# Patient Record
Sex: Female | Born: 1935 | Race: White | Hispanic: No | Smoking: Never smoker
Health system: Southern US, Community
[De-identification: ages and names within clinical notes are randomized; demographics above are authoritative.]

## PROBLEM LIST (undated history)

## (undated) DIAGNOSIS — I509 Heart failure, unspecified: Secondary | ICD-10-CM

## (undated) DIAGNOSIS — M1990 Unspecified osteoarthritis, unspecified site: Secondary | ICD-10-CM

## (undated) DIAGNOSIS — Z7722 Contact with and (suspected) exposure to environmental tobacco smoke (acute) (chronic): Secondary | ICD-10-CM

## (undated) DIAGNOSIS — J189 Pneumonia, unspecified organism: Secondary | ICD-10-CM

## (undated) DIAGNOSIS — E119 Type 2 diabetes mellitus without complications: Secondary | ICD-10-CM

## (undated) DIAGNOSIS — R296 Repeated falls: Secondary | ICD-10-CM

## (undated) DIAGNOSIS — K219 Gastro-esophageal reflux disease without esophagitis: Secondary | ICD-10-CM

## (undated) DIAGNOSIS — F329 Major depressive disorder, single episode, unspecified: Secondary | ICD-10-CM

## (undated) DIAGNOSIS — I951 Orthostatic hypotension: Secondary | ICD-10-CM

## (undated) DIAGNOSIS — I10 Essential (primary) hypertension: Secondary | ICD-10-CM

## (undated) DIAGNOSIS — A809 Acute poliomyelitis, unspecified: Secondary | ICD-10-CM

## (undated) DIAGNOSIS — R627 Adult failure to thrive: Secondary | ICD-10-CM

## (undated) DIAGNOSIS — E079 Disorder of thyroid, unspecified: Secondary | ICD-10-CM

## (undated) DIAGNOSIS — R0602 Shortness of breath: Secondary | ICD-10-CM

## (undated) DIAGNOSIS — Z950 Presence of cardiac pacemaker: Secondary | ICD-10-CM

## (undated) DIAGNOSIS — F32A Depression, unspecified: Secondary | ICD-10-CM

## (undated) DIAGNOSIS — J449 Chronic obstructive pulmonary disease, unspecified: Secondary | ICD-10-CM

## (undated) DIAGNOSIS — I480 Paroxysmal atrial fibrillation: Secondary | ICD-10-CM

## (undated) DIAGNOSIS — E871 Hypo-osmolality and hyponatremia: Secondary | ICD-10-CM

## (undated) HISTORY — PX: CHOLECYSTECTOMY: SHX55

## (undated) HISTORY — PX: EYE SURGERY: SHX253

## (undated) HISTORY — PX: ABDOMINAL HYSTERECTOMY: SHX81

---

## 2015-12-23 ENCOUNTER — Encounter (HOSPITAL_COMMUNITY): Payer: Self-pay | Admitting: *Deleted

## 2015-12-23 ENCOUNTER — Emergency Department (HOSPITAL_COMMUNITY): Payer: Medicare Other

## 2015-12-23 ENCOUNTER — Inpatient Hospital Stay (HOSPITAL_COMMUNITY)
Admission: EM | Admit: 2015-12-23 | Discharge: 2015-12-27 | DRG: 184 | Disposition: A | Discharge status: Skilled Nursing Facility | Payer: Medicare Other | Attending: Internal Medicine | Admitting: Internal Medicine

## 2015-12-23 DIAGNOSIS — E876 Hypokalemia: Secondary | ICD-10-CM | POA: Diagnosis present

## 2015-12-23 DIAGNOSIS — W1830XA Fall on same level, unspecified, initial encounter: Secondary | ICD-10-CM | POA: Diagnosis present

## 2015-12-23 DIAGNOSIS — I110 Hypertensive heart disease with heart failure: Secondary | ICD-10-CM | POA: Diagnosis present

## 2015-12-23 DIAGNOSIS — S2242XA Multiple fractures of ribs, left side, initial encounter for closed fracture: Principal | ICD-10-CM | POA: Diagnosis present

## 2015-12-23 DIAGNOSIS — R0781 Pleurodynia: Secondary | ICD-10-CM | POA: Diagnosis not present

## 2015-12-23 DIAGNOSIS — Z9049 Acquired absence of other specified parts of digestive tract: Secondary | ICD-10-CM

## 2015-12-23 DIAGNOSIS — S2239XA Fracture of one rib, unspecified side, initial encounter for closed fracture: Secondary | ICD-10-CM | POA: Diagnosis present

## 2015-12-23 DIAGNOSIS — E119 Type 2 diabetes mellitus without complications: Secondary | ICD-10-CM | POA: Diagnosis present

## 2015-12-23 DIAGNOSIS — R296 Repeated falls: Secondary | ICD-10-CM | POA: Diagnosis present

## 2015-12-23 DIAGNOSIS — Z79899 Other long term (current) drug therapy: Secondary | ICD-10-CM

## 2015-12-23 DIAGNOSIS — E860 Dehydration: Secondary | ICD-10-CM | POA: Diagnosis present

## 2015-12-23 DIAGNOSIS — S2232XA Fracture of one rib, left side, initial encounter for closed fracture: Secondary | ICD-10-CM

## 2015-12-23 DIAGNOSIS — J90 Pleural effusion, not elsewhere classified: Secondary | ICD-10-CM

## 2015-12-23 DIAGNOSIS — S2232XS Fracture of one rib, left side, sequela: Secondary | ICD-10-CM

## 2015-12-23 DIAGNOSIS — R55 Syncope and collapse: Secondary | ICD-10-CM | POA: Diagnosis present

## 2015-12-23 DIAGNOSIS — I509 Heart failure, unspecified: Secondary | ICD-10-CM

## 2015-12-23 DIAGNOSIS — Z952 Presence of prosthetic heart valve: Secondary | ICD-10-CM

## 2015-12-23 DIAGNOSIS — F339 Major depressive disorder, recurrent, unspecified: Secondary | ICD-10-CM | POA: Diagnosis present

## 2015-12-23 DIAGNOSIS — I4891 Unspecified atrial fibrillation: Secondary | ICD-10-CM | POA: Diagnosis not present

## 2015-12-23 DIAGNOSIS — J449 Chronic obstructive pulmonary disease, unspecified: Secondary | ICD-10-CM | POA: Diagnosis not present

## 2015-12-23 DIAGNOSIS — S2249XA Multiple fractures of ribs, unspecified side, initial encounter for closed fracture: Secondary | ICD-10-CM | POA: Diagnosis present

## 2015-12-23 DIAGNOSIS — E871 Hypo-osmolality and hyponatremia: Secondary | ICD-10-CM | POA: Diagnosis present

## 2015-12-23 DIAGNOSIS — R627 Adult failure to thrive: Secondary | ICD-10-CM | POA: Diagnosis present

## 2015-12-23 DIAGNOSIS — W19XXXA Unspecified fall, initial encounter: Secondary | ICD-10-CM

## 2015-12-23 DIAGNOSIS — Y92091 Bathroom in other non-institutional residence as the place of occurrence of the external cause: Secondary | ICD-10-CM

## 2015-12-23 DIAGNOSIS — E039 Hypothyroidism, unspecified: Secondary | ICD-10-CM | POA: Diagnosis present

## 2015-12-23 DIAGNOSIS — I10 Essential (primary) hypertension: Secondary | ICD-10-CM | POA: Diagnosis present

## 2015-12-23 DIAGNOSIS — R7303 Prediabetes: Secondary | ICD-10-CM

## 2015-12-23 HISTORY — DX: Presence of cardiac pacemaker: Z95.0

## 2015-12-23 HISTORY — DX: Disorder of thyroid, unspecified: E07.9

## 2015-12-23 HISTORY — DX: Unspecified osteoarthritis, unspecified site: M19.90

## 2015-12-23 HISTORY — DX: Pneumonia, unspecified organism: J18.9

## 2015-12-23 HISTORY — DX: Essential (primary) hypertension: I10

## 2015-12-23 HISTORY — DX: Chronic obstructive pulmonary disease, unspecified: J44.9

## 2015-12-23 HISTORY — DX: Shortness of breath: R06.02

## 2015-12-23 HISTORY — DX: Type 2 diabetes mellitus without complications: E11.9

## 2015-12-23 HISTORY — DX: Heart failure, unspecified: I50.9

## 2015-12-23 HISTORY — DX: Depression, unspecified: F32.A

## 2015-12-23 HISTORY — DX: Gastro-esophageal reflux disease without esophagitis: K21.9

## 2015-12-23 HISTORY — DX: Acute poliomyelitis, unspecified: A80.9

## 2015-12-23 HISTORY — DX: Major depressive disorder, single episode, unspecified: F32.9

## 2015-12-23 LAB — COMPREHENSIVE METABOLIC PANEL
ALT: 13 U/L — ABNORMAL LOW (ref 14–54)
AST: 17 U/L (ref 15–41)
Albumin: 4.1 g/dL (ref 3.5–5.0)
Alkaline Phosphatase: 79 U/L (ref 38–126)
Anion gap: 10 (ref 5–15)
BUN: 14 mg/dL (ref 6–20)
CHLORIDE: 96 mmol/L — AB (ref 101–111)
CO2: 26 mmol/L (ref 22–32)
Calcium: 9.3 mg/dL (ref 8.9–10.3)
Creatinine, Ser: 0.94 mg/dL (ref 0.44–1.00)
GFR, EST NON AFRICAN AMERICAN: 53 mL/min — AB (ref >60)
Glucose, Bld: 126 mg/dL — ABNORMAL HIGH (ref 65–99)
POTASSIUM: 3.7 mmol/L (ref 3.5–5.1)
SODIUM: 132 mmol/L — AB (ref 135–145)
Total Bilirubin: 0.7 mg/dL (ref 0.3–1.2)
Total Protein: 6.7 g/dL (ref 6.5–8.1)

## 2015-12-23 LAB — CBC WITH DIFFERENTIAL/PLATELET
BASOS ABS: 0 10*3/uL (ref 0.0–0.1)
Basophils Relative: 0 %
EOS ABS: 0.1 10*3/uL (ref 0.0–0.7)
EOS PCT: 1 %
HCT: 39.8 % (ref 36.0–46.0)
Hemoglobin: 13.5 g/dL (ref 12.0–15.0)
LYMPHS PCT: 12 %
Lymphs Abs: 0.9 10*3/uL (ref 0.7–4.0)
MCH: 27.1 pg (ref 26.0–34.0)
MCHC: 33.9 g/dL (ref 30.0–36.0)
MCV: 79.9 fL (ref 78.0–100.0)
MONO ABS: 0.9 10*3/uL (ref 0.1–1.0)
Monocytes Relative: 12 %
Neutro Abs: 5.6 10*3/uL (ref 1.7–7.7)
Neutrophils Relative %: 75 %
PLATELETS: 245 10*3/uL (ref 150–400)
RBC: 4.98 MIL/uL (ref 3.87–5.11)
RDW: 13.6 % (ref 11.5–15.5)
WBC: 7.5 10*3/uL (ref 4.0–10.5)

## 2015-12-23 LAB — CBG MONITORING, ED: GLUCOSE-CAPILLARY: 137 mg/dL — AB (ref 65–99)

## 2015-12-23 LAB — PROTIME-INR
INR: 1.04
PROTHROMBIN TIME: 13.6 s (ref 11.4–15.2)

## 2015-12-23 LAB — BRAIN NATRIURETIC PEPTIDE: B Natriuretic Peptide: 855.7 pg/mL — ABNORMAL HIGH (ref 0.0–100.0)

## 2015-12-23 LAB — I-STAT TROPONIN, ED: TROPONIN I, POC: 0.02 ng/mL (ref 0.00–0.08)

## 2015-12-23 LAB — GLUCOSE, CAPILLARY: Glucose-Capillary: 116 mg/dL — ABNORMAL HIGH (ref 65–99)

## 2015-12-23 MED ORDER — ENOXAPARIN SODIUM 40 MG/0.4ML ~~LOC~~ SOLN
40.0000 mg | Freq: Every day | SUBCUTANEOUS | Status: DC
  Administered 2015-12-23 – 2015-12-26 (×4): 40 mg via SUBCUTANEOUS
  Filled 2015-12-23 (×4): qty 0.4

## 2015-12-23 MED ORDER — ALBUTEROL SULFATE (2.5 MG/3ML) 0.083% IN NEBU
3.0000 mL | INHALATION_SOLUTION | Freq: Four times a day (QID) | RESPIRATORY_TRACT | Status: DC | PRN
  Administered 2015-12-24 – 2015-12-27 (×2): 3 mL via RESPIRATORY_TRACT
  Filled 2015-12-23 (×2): qty 3

## 2015-12-23 MED ORDER — DOCUSATE SODIUM 100 MG PO CAPS
100.0000 mg | ORAL_CAPSULE | Freq: Two times a day (BID) | ORAL | Status: DC
  Administered 2015-12-23 – 2015-12-27 (×8): 100 mg via ORAL
  Filled 2015-12-23 (×8): qty 1

## 2015-12-23 MED ORDER — MOMETASONE FURO-FORMOTEROL FUM 200-5 MCG/ACT IN AERO
2.0000 | INHALATION_SPRAY | Freq: Two times a day (BID) | RESPIRATORY_TRACT | Status: DC
Start: 2015-12-23 — End: 2015-12-27
  Administered 2015-12-23 – 2015-12-27 (×8): 2 via RESPIRATORY_TRACT
  Filled 2015-12-23: qty 8.8

## 2015-12-23 MED ORDER — CICLOPIROX OLAMINE 0.77 % EX CREA: 1.0000 "application " | TOPICAL_CREAM | Freq: Two times a day (BID) | CUTANEOUS | Status: DC | PRN

## 2015-12-23 MED ORDER — KETOROLAC TROMETHAMINE 15 MG/ML IJ SOLN
15.0000 mg | Freq: Four times a day (QID) | INTRAMUSCULAR | Status: DC | PRN
  Administered 2015-12-23 – 2015-12-24 (×2): 15 mg via INTRAVENOUS
  Filled 2015-12-23 (×2): qty 1

## 2015-12-23 MED ORDER — INSULIN ASPART 100 UNIT/ML ~~LOC~~ SOLN
0.0000 [IU] | Freq: Three times a day (TID) | SUBCUTANEOUS | Status: DC
  Administered 2015-12-25 (×2): 2 [IU] via SUBCUTANEOUS
  Administered 2015-12-26: 3 [IU] via SUBCUTANEOUS
  Administered 2015-12-26: 1 [IU] via SUBCUTANEOUS
  Administered 2015-12-27: 2 [IU] via SUBCUTANEOUS

## 2015-12-23 MED ORDER — TRAMADOL HCL 50 MG PO TABS: 50.0000 mg | ORAL_TABLET | Freq: Four times a day (QID) | ORAL | Status: DC | PRN

## 2015-12-23 MED ORDER — MUSCLE RUB 10-15 % EX CREA
1.0000 "application " | TOPICAL_CREAM | Freq: Three times a day (TID) | CUTANEOUS | Status: DC
  Administered 2015-12-23 – 2015-12-27 (×8): 1 via TOPICAL
  Filled 2015-12-23: qty 85

## 2015-12-23 MED ORDER — MELATONIN 3 MG PO TABS: 3.0000 mg | ORAL_TABLET | Freq: Every day | ORAL | Status: DC

## 2015-12-23 MED ORDER — LEVOTHYROXINE SODIUM 100 MCG PO TABS
100.0000 ug | ORAL_TABLET | Freq: Every day | ORAL | Status: DC
  Administered 2015-12-24 – 2015-12-27 (×4): 100 ug via ORAL
  Filled 2015-12-23 (×4): qty 1

## 2015-12-23 MED ORDER — FENTANYL CITRATE (PF) 100 MCG/2ML IJ SOLN
25.0000 ug | Freq: Once | INTRAMUSCULAR | Status: AC
  Administered 2015-12-23: 25 ug via INTRAVENOUS
  Filled 2015-12-23: qty 2

## 2015-12-23 MED ORDER — ONDANSETRON HCL 4 MG/2ML IJ SOLN: 4.0000 mg | Freq: Four times a day (QID) | INTRAMUSCULAR | Status: DC | PRN

## 2015-12-23 MED ORDER — DILTIAZEM HCL ER COATED BEADS 120 MG PO CP24
120.0000 mg | ORAL_CAPSULE | Freq: Every day | ORAL | Status: DC
  Administered 2015-12-23 – 2015-12-26 (×4): 120 mg via ORAL
  Filled 2015-12-23 (×4): qty 1

## 2015-12-23 MED ORDER — SODIUM CHLORIDE 0.9% FLUSH
3.0000 mL | Freq: Two times a day (BID) | INTRAVENOUS | Status: DC
  Administered 2015-12-23 – 2015-12-27 (×6): 3 mL via INTRAVENOUS

## 2015-12-23 MED ORDER — FUROSEMIDE 10 MG/ML IJ SOLN
20.0000 mg | Freq: Once | INTRAMUSCULAR | Status: AC
  Administered 2015-12-23: 20 mg via INTRAVENOUS
  Filled 2015-12-23: qty 4

## 2015-12-23 MED ORDER — POLYVINYL ALCOHOL 1.4 % OP SOLN
1.0000 [drp] | Freq: Two times a day (BID) | OPHTHALMIC | Status: DC
  Administered 2015-12-23 – 2015-12-27 (×7): 1 [drp] via OPHTHALMIC
  Filled 2015-12-23: qty 15

## 2015-12-23 MED ORDER — IPRATROPIUM BROMIDE 0.02 % IN SOLN
0.5000 mg | Freq: Four times a day (QID) | RESPIRATORY_TRACT | Status: DC | PRN
Start: 2015-12-23 — End: 2015-12-27

## 2015-12-23 MED ORDER — ACETAMINOPHEN 650 MG RE SUPP: 650.0000 mg | Freq: Four times a day (QID) | RECTAL | Status: DC | PRN

## 2015-12-23 MED ORDER — LORATADINE 10 MG PO TABS
10.0000 mg | ORAL_TABLET | Freq: Every day | ORAL | Status: DC
  Administered 2015-12-23 – 2015-12-27 (×5): 10 mg via ORAL
  Filled 2015-12-23 (×5): qty 1

## 2015-12-23 MED ORDER — PANTOPRAZOLE SODIUM 40 MG PO TBEC
40.0000 mg | DELAYED_RELEASE_TABLET | Freq: Every day | ORAL | Status: DC
  Administered 2015-12-23 – 2015-12-27 (×5): 40 mg via ORAL
  Filled 2015-12-23 (×5): qty 1

## 2015-12-23 MED ORDER — IOPAMIDOL (ISOVUE-370) INJECTION 76%
100.0000 mL | Freq: Once | INTRAVENOUS | Status: AC | PRN
  Administered 2015-12-23: 80 mL via INTRAVENOUS

## 2015-12-23 MED ORDER — FUROSEMIDE 20 MG PO TABS
10.0000 mg | ORAL_TABLET | Freq: Every day | ORAL | Status: DC
  Administered 2015-12-23 – 2015-12-27 (×5): 10 mg via ORAL
  Filled 2015-12-23 (×5): qty 1

## 2015-12-23 MED ORDER — PSYLLIUM 95 % PO PACK
1.0000 | PACK | Freq: Every day | ORAL | Status: DC
  Administered 2015-12-23 – 2015-12-26 (×4): 1 via ORAL
  Filled 2015-12-23 (×4): qty 1

## 2015-12-23 MED ORDER — LOSARTAN POTASSIUM 50 MG PO TABS
25.0000 mg | ORAL_TABLET | Freq: Every day | ORAL | Status: DC
  Administered 2015-12-23 – 2015-12-27 (×5): 25 mg via ORAL
  Filled 2015-12-23 (×5): qty 1

## 2015-12-23 MED ORDER — ACETAMINOPHEN 325 MG PO TABS: 650.0000 mg | ORAL_TABLET | Freq: Four times a day (QID) | ORAL | Status: DC | PRN

## 2015-12-23 MED ORDER — SERTRALINE HCL 100 MG PO TABS
100.0000 mg | ORAL_TABLET | Freq: Every day | ORAL | Status: DC
  Administered 2015-12-24: 100 mg via ORAL
  Filled 2015-12-23: qty 1

## 2015-12-23 MED ORDER — ONDANSETRON HCL 4 MG PO TABS: 4.0000 mg | ORAL_TABLET | Freq: Four times a day (QID) | ORAL | Status: DC | PRN

## 2015-12-23 NOTE — ED Triage Notes (Signed)
Patient brought in via EMS from [facility]. Patient had an unwitnessed fall without head injury. Patient states she became dizzy and tripped on walker. Patient admits to dizziness x 5 days. Patient c/o rt rib pain 10/10

## 2015-12-23 NOTE — H&P (Signed)
History and Physical    Kathy Peterson ZWC:[id number] DOB: 03-19-1927 DOA: 12/23/2015  PCP:  PCP is in [facility] or [place], not sure of the name Consultants:  "Honey, I got so many doctors I don't know" Patient coming from: lives in a rest home, uncertain of name  Chief Complaint: falls  HPI: Kathy Peterson is a 82 y.o. female with medical history significant of afib, DM,CHF (NOS), COPD, HTN presenting with falls.  Patient states that her left side and back have been hurting since fall.  Chronic dizziness, worse currently c/p Fentanyl.  Patient is a poor historian and family has already left.    HPI per ER Dr. Dalene Seltzer: Reports dizziness and fall today after using the restroom. Reports dizziness for months, off and on. Describes it has both room spinning and near syncope. It happens when she is sitting, worse when leaning forward. Has had syncope in the past.  Larey Seat on left side, no LOC.  Thinks hit her head.  Now having pain in left ribs.  Has chronic headaches which are unchanged. No n/v now.  Feels like can't get her breath but doesn't know how long it has been happening, comes and goes. Having a little bit right now.    ED Course:  No prior records available, generally normal labs other than elevated BNP but negative CXR for pulmonary edema, small pleural effusion that does not appear to be hemothorax, rib fractures. Called hospitalist for admission for telemetry monitoring as well as pain control, PT evaluation.  Review of Systems: As per HPI; otherwise 10 point review of systems reviewed and negative.   Ambulatory Status:  Walks with a walker  Past Medical History:  Diagnosis Date  . Arthritis    rheumatoid  . Atrial fibrillation (HCC)   . CHF (congestive heart failure) (HCC)   . COPD (chronic obstructive pulmonary disease) (HCC)   . Depression   . Diabetes mellitus without complication (HCC)    type 2  . GERD (gastroesophageal reflux disease)   . Hypertension   . Pacemaker     . Pneumonia   . Polio   . Shortness of breath   . Thyroid disease    hypothyroid    Past Surgical History:  Procedure Laterality Date  . ABDOMINAL HYSTERECTOMY    . CHOLECYSTECTOMY    . EYE SURGERY      Social History   Social History  . Marital status: Widowed    Spouse name: N/A  . Number of children: N/A  . Years of education: N/A   Occupational History  . retired    Social History Main Topics  . Smoking status: Never Smoker  . Smokeless tobacco: Never Used  . Alcohol use No  . Drug use: No  . Sexual activity: No   Other Topics Concern  . Not on file   Social History Narrative  . No narrative on file    Allergies  Allergen Reactions  . Penicillins Other (See Comments)    Reaction:  Unknown  Has patient had a PCN reaction causing immediate rash, facial/tongue/throat swelling, SOB or lightheadedness with hypotension: Unsure Has patient had a PCN reaction causing severe rash involving mucus membranes or skin necrosis: Unsure Has patient had a PCN reaction that required hospitalization Unsure Has patient had a PCN reaction occurring within the last 10 years: Unsure If all of the above answers are "NO", then may proceed with Cephalosporin use.  . Pneumovax [Pneumococcal Polysaccharide Vaccine] Other (See Comments)  Reaction:  Unknown   . Statins Other (See Comments)    Reaction:  Unknown   . Sulfa Antibiotics Other (See Comments)    Reaction:  Unknown     Family History  Problem Relation Age of Onset  . Family history unknown: Yes    Prior to Admission medications   Medication Sig Start Date End Date Taking? Authorizing Provider  acetaminophen (TYLENOL) 500 MG tablet Take 500 mg by mouth 3 (three) times daily. Pt is also able to use every eight hours as needed for pain. 12/22/15 12/29/15 Yes Historical Provider, MD  albuterol (PROVENTIL HFA;VENTOLIN HFA) 108 (90 Base) MCG/ACT inhaler Inhale 2 puffs into the lungs every 6 (six) hours as needed for  wheezing or shortness of breath.   Yes Historical Provider, MD  Calcium Carb-Cholecalciferol (CALCIUM 600+D) 600-800 MG-UNIT TABS Take 1 tablet by mouth daily.   Yes Historical Provider, MD  Carboxymethylcell-Glycerin PF (OPTIVE SENSITIVE) 0.5-0.9 % SOLN Place 1 drop into both eyes every 12 (twelve) hours.   Yes Historical Provider, MD  ciclopirox (LOPROX) 0.77 % cream Apply 1 application topically 2 (two) times daily as needed (for rash under breasts).   Yes Historical Provider, MD  diltiazem (DILACOR XR) 120 MG 24 hr capsule Take 120 mg by mouth at bedtime.   Yes Historical Provider, MD  fluticasone-salmeterol (ADVAIR HFA) 115-21 MCG/ACT inhaler Inhale 2 puffs into the lungs 2 (two) times daily.   Yes Historical Provider, MD  furosemide (LASIX) 20 MG tablet Take 10 mg by mouth daily.   Yes Historical Provider, MD  ipratropium (ATROVENT) 0.02 % nebulizer solution Take 0.5 mg by nebulization every 6 (six) hours as needed for wheezing or shortness of breath.   Yes Historical Provider, MD  levothyroxine (SYNTHROID, LEVOTHROID) 100 MCG tablet Take 100 mcg by mouth daily before breakfast.   Yes Historical Provider, MD  loratadine (CLARITIN) 10 MG tablet Take 10 mg by mouth daily.   Yes Historical Provider, MD  losartan (COZAAR) 25 MG tablet Take 25 mg by mouth daily.   Yes Historical Provider, MD  Melatonin 3 MG TABS Take 3 mg by mouth at bedtime.   Yes Historical Provider, MD  Menthol, Topical Analgesic, (BIOFREEZE) 4 % GEL Apply 1 application topically 3 (three) times daily. Pt applies to left side and back. 12/22/15 12/25/15 Yes Historical Provider, MD  pantoprazole (PROTONIX) 40 MG tablet Take 40 mg by mouth daily.   Yes Historical Provider, MD  psyllium (REGULOID) 0.52 g capsule Take 0.52 g by mouth at bedtime.   Yes Historical Provider, MD  sertraline (ZOLOFT) 100 MG tablet Take 100 mg by mouth daily.   Yes Historical Provider, MD  traMADol (ULTRAM) 50 MG tablet Take 50 mg by mouth every 6 (six) hours  as needed for moderate pain.   Yes Historical Provider, MD    Physical Exam: Vitals:   12/23/15 2041 12/23/15 2058 12/23/15 2132 12/24/15 0514  BP: 129/90 (!) 143/83  (!) 153/66  Pulse: 94 95  87  Resp: 18   20  Temp:  98.5 F (36.9 C)  99.4 F (37.4 C)  TempSrc:  Oral  Oral  SpO2: 96% 96% 95% 99%  Weight:  68.9 kg (152 lb)    Height:  5\' 6"  (1.676 m)       General:  Appears calm and comfortable and is NAD Eyes:  PERRL, EOMI, normal lids, iris ENT:  grossly normal hearing, lips & tongue, mmm Neck:  no LAD, masses or thyromegaly Cardiovascular:  RRR, no m/r/g. No LE edema.  Respiratory:  CTA bilaterally, no w/r/r. Normal respiratory effort. Abdomen:  soft, ntnd, NABS Skin:  no rash or induration seen on limited exam Musculoskeletal:  grossly normal tone BUE/BLE, good ROM, no bony abnormality Psychiatric:  grossly normal mood and affect, speech fluent and appropriate, AOx3 Neurologic:  CN 2-12 grossly intact, moves all extremities in coordinated fashion, sensation intact  Labs on Admission: I have personally reviewed following labs and imaging studies  CBC:  Recent Labs Lab 12/23/15 1710 12/24/15 0428  WBC 7.5 7.5  NEUTROABS 5.6  --   HGB 13.5 12.4  HCT 39.8 35.7*  MCV 79.9 79.7  PLT 245 236   Basic Metabolic Panel:  Recent Labs Lab 12/23/15 1710 12/24/15 0428  NA 132* 132*  K 3.7 3.1*  CL 96* 96*  CO2 26 26  GLUCOSE 126* 94  BUN 14 17  CREATININE 0.94 0.83  CALCIUM 9.3 8.9   GFR: Estimated Creatinine Clearance: 44.7 mL/min (by C-G formula based on SCr of 0.83 mg/dL). Liver Function Tests:  Recent Labs Lab 12/23/15 1710  AST 17  ALT 13*  ALKPHOS 79  BILITOT 0.7  PROT 6.7  ALBUMIN 4.1   No results for input(s): LIPASE, AMYLASE in the last 168 hours. No results for input(s): AMMONIA in the last 168 hours. Coagulation Profile:  Recent Labs Lab 12/23/15 1700  INR 1.04   Cardiac Enzymes: No results for input(s): CKTOTAL, CKMB, CKMBINDEX,  TROPONINI in the last 168 hours. BNP (last 3 results) No results for input(s): PROBNP in the last 8760 hours. HbA1C: No results for input(s): HGBA1C in the last 72 hours. CBG:  Recent Labs Lab 12/23/15 1620 12/23/15 2310  GLUCAP 137* 116*   Lipid Profile: No results for input(s): CHOL, HDL, LDLCALC, TRIG, CHOLHDL, LDLDIRECT in the last 72 hours. Thyroid Function Tests: No results for input(s): TSH, T4TOTAL, FREET4, T3FREE, THYROIDAB in the last 72 hours. Anemia Panel: No results for input(s): VITAMINB12, FOLATE, FERRITIN, TIBC, IRON, RETICCTPCT in the last 72 hours. Urine analysis: No results found for: COLORURINE, APPEARANCEUR, LABSPEC, PHURINE, GLUCOSEU, HGBUR, BILIRUBINUR, KETONESUR, PROTEINUR, UROBILINOGEN, NITRITE, LEUKOCYTESUR  Creatinine Clearance: Estimated Creatinine Clearance: 44.7 mL/min (by C-G formula based on SCr of 0.83 mg/dL).  Sepsis Labs: @LABRCNTIP (procalcitonin:4,lacticidven:4) ) Recent Results (from the past 240 hour(s))  MRSA PCR Screening     Status: None   Collection Time: 12/23/15  9:14 PM  Result Value Ref Range Status   MRSA by PCR NEGATIVE NEGATIVE Final    Comment:        The GeneXpert MRSA Assay (FDA approved for NASAL specimens only), is one component of a comprehensive MRSA colonization surveillance program. It is not intended to diagnose MRSA infection nor to guide or monitor treatment for MRSA infections.      Radiological Exams on Admission: Ct Head Wo Contrast  Result Date: 12/23/2015 CLINICAL DATA:  Un witnessed fall with dizziness and neck pain, initial encounter EXAM: CT HEAD WITHOUT CONTRAST CT CERVICAL SPINE WITHOUT CONTRAST TECHNIQUE: Multidetector CT imaging of the head and cervical spine was performed following the standard protocol without intravenous contrast. Multiplanar CT image reconstructions of the cervical spine were also generated. COMPARISON:  None. FINDINGS: CT HEAD FINDINGS The bony calvarium is intact. Mild  mucosal changes are noted in the left maxillary antrum. Diffuse atrophic changes are seen. No findings to suggest acute hemorrhage, acute infarction or space-occupying mass lesion are noted. CT CERVICAL SPINE FINDINGS Seven cervical segments are well visualized. Vertebral body  height is well maintained. Disc space narrowing is noted at C5-6 and C6-7 with associated osteophytic changes. Multilevel facet hypertrophic changes are seen. No acute fracture or acute facet abnormality is noted. The visualized lung apices demonstrate mild scarring bilaterally. A small left pleural effusion is seen. IMPRESSION: CT of the head: Chronic atrophic changes without acute abnormality. CT of the cervical spine: Multilevel degenerative changes without acute bony abnormality. Small left pleural effusion is seen. Electronically Signed   By: Alcide Clever M.D.   On: 12/23/2015 19:02   Ct Angio Chest Pe W And/or Wo Contrast  Result Date: 12/23/2015 CLINICAL DATA:  Recent fall with chest pain and shortness of Breath EXAM: CT ANGIOGRAPHY CHEST WITH CONTRAST TECHNIQUE: Multidetector CT imaging of the chest was performed using the standard protocol during bolus administration of intravenous contrast. Multiplanar CT image reconstructions and MIPs were obtained to evaluate the vascular anatomy. CONTRAST:  80 mL Isovue 370. COMPARISON:  None. FINDINGS: Mediastinum/Lymph Nodes: No significant hilar or mediastinal adenopathy is identified. The thoracic inlet is within normal limits. Cardiovascular: Diffuse aortic calcifications are seen without aneurysmal dilatation or dissection. The pulmonary artery shows a normal branching pattern without intraluminal filling defect to suggest pulmonary embolism. Coronary calcifications are noted. Mitral valve replacement is seen Lungs/Pleura: Mild left lower lobe atelectasis is noted with associated small effusion. No pneumothorax is seen. Upper abdomen: No acute abnormality noted. Musculoskeletal: Mildly  displaced fractures of the left seventh and eighth ribs Review of the MIP images confirms the above findings. IMPRESSION: No evidence of pulmonary emboli. Fractures of the left seventh and eighth ribs without pneumothorax. Associated left lower lobe atelectasis and effusion is noted. Electronically Signed   By: Alcide Clever M.D.   On: 12/23/2015 19:10   Ct Cervical Spine Wo Contrast  Result Date: 12/23/2015 CLINICAL DATA:  Un witnessed fall with dizziness and neck pain, initial encounter EXAM: CT HEAD WITHOUT CONTRAST CT CERVICAL SPINE WITHOUT CONTRAST TECHNIQUE: Multidetector CT imaging of the head and cervical spine was performed following the standard protocol without intravenous contrast. Multiplanar CT image reconstructions of the cervical spine were also generated. COMPARISON:  None. FINDINGS: CT HEAD FINDINGS The bony calvarium is intact. Mild mucosal changes are noted in the left maxillary antrum. Diffuse atrophic changes are seen. No findings to suggest acute hemorrhage, acute infarction or space-occupying mass lesion are noted. CT CERVICAL SPINE FINDINGS Seven cervical segments are well visualized. Vertebral body height is well maintained. Disc space narrowing is noted at C5-6 and C6-7 with associated osteophytic changes. Multilevel facet hypertrophic changes are seen. No acute fracture or acute facet abnormality is noted. The visualized lung apices demonstrate mild scarring bilaterally. A small left pleural effusion is seen. IMPRESSION: CT of the head: Chronic atrophic changes without acute abnormality. CT of the cervical spine: Multilevel degenerative changes without acute bony abnormality. Small left pleural effusion is seen. Electronically Signed   By: Alcide Clever M.D.   On: 12/23/2015 19:02    EKG: Not done  Assessment/Plan Principal Problem:   Rib fractures Active Problems:   Atrial fibrillation (HCC)   CHF (congestive heart failure) (HCC)   COPD (chronic obstructive pulmonary disease)  (HCC)   Diabetes mellitus, type 2 (HCC)   Essential (primary) hypertension   Hypothyroidism   Falls   Rib fractures -Patient with recurrent falls, today resulting in 2 rib fractures -Will place in observation status for pain control with tylenol/toradol as needed -SW consult for placement -PT consult  DM -Does not appear to be  taking medications at home -Will cover with SSI  Afib/HTN -on Diltiazem, will continue -Will monitor on telemetry in case arrhythmia is contributing to falls -Does not appear to be taking any anticoagulant including ASA, would recommend starting -Continue Cozaar  COPD -Continue Advair (Dulera substituted) and Albuterol/Atrovent  Hypothyroidism -Continue Synthroid -Will check TSH, free T4  CHF -Reported h/o CHF, no additional information is available at this time -Elevated BNP -No respiratory symptoms, normal lung exam, no edema on chest CT -Will not treat for CHF at this time (was given Lasix x 1 in ER)  Hyponatremia -Mild -Likely from Lasix at home -Consider small IVF bolus if ongoing/worsening  DVT prophylaxis: Lovenox  Code Status:  Full  Family Communication: Not present - they had already left by the time I came to evaluate patient Disposition Plan: SNF Consults called: SW Admission status: Observation, telemetry   Jonah Blue MD [facility]  If 7PM-7AM, please contact night-coverage [email or web address] Password [email or web address]  12/24/2015, 5:40 AM

## 2015-12-23 NOTE — ED Provider Notes (Signed)
[facility]-EMERGENCY DEPT Provider Note   CSN: [id number] Arrival date & time: 12/23/15  1617  First Provider Contact:  None       History   Chief Complaint Chief Complaint  Patient presents with  . Fall    left  flank pain     HPI Kathy Peterson is a 82 y.o. female.  HPI   Reports dizziness and fall today after using the restroom. Reports dizziness for months, off and on. Describes it has both room spinning and near syncope. It happens when she is sitting, worse when leaning forward. Has had syncope in the past.  Larey Seat on Monday on left side with immediate pain to left chest, then today fell again.  Pain has been getting worse, breathing has been getting worse.  Fell on left side, no LOC.  Thinks hit her head.  Now having more pain in left ribs.  Has chronic headaches which are unchanged. No n/v now.  Feels like can't get her breath but doesn't know how long it has been happening, comes and goes. Having a little bit right now.  Has CHF. Chronic orthopnea. Has been seen at [facility]    Past Medical History:  Diagnosis Date  . Arthritis    rheumatoid  . Atrial fibrillation (HCC)   . CHF (congestive heart failure) (HCC)   . COPD (chronic obstructive pulmonary disease) (HCC)   . Depression   . Diabetes mellitus without complication (HCC)    type 2  . GERD (gastroesophageal reflux disease)   . Hypertension   . Pacemaker   . Pneumonia   . Polio   . Shortness of breath   . Thyroid disease    hypothyroid    Patient Active Problem List   Diagnosis Date Noted  . Hyponatremia 12/24/2015  . Rib fractures 12/23/2015  . Atrial fibrillation (HCC) 12/23/2015  . CHF (congestive heart failure) (HCC) 12/23/2015  . COPD (chronic obstructive pulmonary disease) (HCC) 12/23/2015  . Diabetes mellitus, type 2 (HCC) 12/23/2015  . Essential (primary) hypertension 12/23/2015  . Hypothyroidism 12/23/2015  . Falls 12/23/2015    Past Surgical History:  Procedure Laterality Date  .  ABDOMINAL HYSTERECTOMY    . CHOLECYSTECTOMY    . EYE SURGERY      OB History    Gravida Para Term Preterm AB Living   2 2 2     2    SAB TAB Ectopic Multiple Live Births           2       Home Medications    Prior to Admission medications   Medication Sig Start Date End Date Taking? Authorizing Provider  acetaminophen (TYLENOL) 500 MG tablet Take 500 mg by mouth 3 (three) times daily. Pt is also able to use every eight hours as needed for pain. 12/22/15 12/29/15 Yes Historical Provider, MD  albuterol (PROVENTIL HFA;VENTOLIN HFA) 108 (90 Base) MCG/ACT inhaler Inhale 2 puffs into the lungs every 6 (six) hours as needed for wheezing or shortness of breath.   Yes Historical Provider, MD  Calcium Carb-Cholecalciferol (CALCIUM 600+D) 600-800 MG-UNIT TABS Take 1 tablet by mouth daily.   Yes Historical Provider, MD  Carboxymethylcell-Glycerin PF (OPTIVE SENSITIVE) 0.5-0.9 % SOLN Place 1 drop into both eyes every 12 (twelve) hours.   Yes Historical Provider, MD  ciclopirox (LOPROX) 0.77 % cream Apply 1 application topically 2 (two) times daily as needed (for rash under breasts).   Yes Historical Provider, MD  diltiazem (DILACOR XR) 120 MG 24  hr capsule Take 120 mg by mouth at bedtime.   Yes Historical Provider, MD  fluticasone-salmeterol (ADVAIR HFA) 115-21 MCG/ACT inhaler Inhale 2 puffs into the lungs 2 (two) times daily.   Yes Historical Provider, MD  furosemide (LASIX) 20 MG tablet Take 10 mg by mouth daily.   Yes Historical Provider, MD  ipratropium (ATROVENT) 0.02 % nebulizer solution Take 0.5 mg by nebulization every 6 (six) hours as needed for wheezing or shortness of breath.   Yes Historical Provider, MD  levothyroxine (SYNTHROID, LEVOTHROID) 100 MCG tablet Take 100 mcg by mouth daily before breakfast.   Yes Historical Provider, MD  loratadine (CLARITIN) 10 MG tablet Take 10 mg by mouth daily.   Yes Historical Provider, MD  losartan (COZAAR) 25 MG tablet Take 25 mg by mouth daily.   Yes  Historical Provider, MD  Melatonin 3 MG TABS Take 3 mg by mouth at bedtime.   Yes Historical Provider, MD  Menthol, Topical Analgesic, (BIOFREEZE) 4 % GEL Apply 1 application topically 3 (three) times daily. Pt applies to left side and back. 12/22/15 12/25/15 Yes Historical Provider, MD  pantoprazole (PROTONIX) 40 MG tablet Take 40 mg by mouth daily.   Yes Historical Provider, MD  psyllium (REGULOID) 0.52 g capsule Take 0.52 g by mouth at bedtime.   Yes Historical Provider, MD  sertraline (ZOLOFT) 100 MG tablet Take 100 mg by mouth daily.   Yes Historical Provider, MD  traMADol (ULTRAM) 50 MG tablet Take 50 mg by mouth every 6 (six) hours as needed for moderate pain.   Yes Historical Provider, MD    Family History Family History  Problem Relation Age of Onset  . Family history unknown: Yes    Social History Social History  Substance Use Topics  . Smoking status: Never Smoker  . Smokeless tobacco: Never Used  . Alcohol use No     Allergies   Penicillins; Pneumovax [pneumococcal polysaccharide vaccine]; Statins; and Sulfa antibiotics   Review of Systems Review of Systems  Constitutional: Positive for fatigue. Negative for fever.  Eyes: Negative for visual disturbance.  Respiratory: Positive for cough (i keep a cough) and shortness of breath.   Cardiovascular: Positive for chest pain. Negative for leg swelling.  Gastrointestinal: Negative for abdominal pain, nausea and vomiting.  Genitourinary: Negative for difficulty urinating and dysuria.  Musculoskeletal: Positive for back pain.  Skin: Negative for rash.  Neurological: Positive for dizziness and headaches. Negative for syncope (episodes in past but not today), facial asymmetry, weakness, light-headedness and numbness.     Physical Exam Updated Vital Signs BP (!) 138/58 (BP Location: Left Arm)   Pulse 91   Temp 98.5 F (36.9 C) (Oral)   Resp (!) 22   Ht  (1.676 m)   Wt 152 lb (68.9 kg)   LMP  (LMP Unknown)   SpO2  97%   BMI 24.53 kg/m   Physical Exam  Constitutional: She is oriented to person, place, and time. She appears well-developed and well-nourished. No distress.  HENT:  Head: Normocephalic and atraumatic.  Eyes: Conjunctivae and EOM are normal.  Neck: Normal range of motion. JVD present.  Cardiovascular: Normal rate, regular rhythm, normal heart sounds and intact distal pulses.  Exam reveals no gallop and no friction rub.   No murmur heard. Pulmonary/Chest: Effort normal. No respiratory distress. She has decreased breath sounds in the left lower field. She has no wheezes. She has no rales. She exhibits tenderness (left).  Abdominal: Soft. She exhibits no distension. There  is no tenderness. There is no guarding.  Musculoskeletal: She exhibits no edema.       Cervical back: She exhibits tenderness (left musculature).       Thoracic back: She exhibits tenderness.  Neurological: She is alert and oriented to person, place, and time.  Skin: Skin is warm and dry. No rash noted. She is not diaphoretic. No erythema.  Nursing note and vitals reviewed.    ED Treatments / Results  Labs (all labs ordered are listed, but only abnormal results are displayed) Labs Reviewed  COMPREHENSIVE METABOLIC PANEL - Abnormal; Notable for the following:       Result Value   Sodium 132 (*)    Chloride 96 (*)    Glucose, Bld 126 (*)    ALT 13 (*)    GFR calc non Af Amer 53 (*)    All other components within normal limits  BRAIN NATRIURETIC PEPTIDE - Abnormal; Notable for the following:    B Natriuretic Peptide 855.7 (*)    All other components within normal limits  BASIC METABOLIC PANEL - Abnormal; Notable for the following:    Sodium 132 (*)    Potassium 3.1 (*)    Chloride 96 (*)    All other components within normal limits  CBC - Abnormal; Notable for the following:    HCT 35.7 (*)    All other components within normal limits  GLUCOSE, CAPILLARY - Abnormal; Notable for the following:     Glucose-Capillary 116 (*)    All other components within normal limits  T4, FREE - Abnormal; Notable for the following:    Free T4 1.22 (*)    All other components within normal limits  CBG MONITORING, ED - Abnormal; Notable for the following:    Glucose-Capillary 137 (*)    All other components within normal limits  MRSA PCR SCREENING  CBC WITH DIFFERENTIAL/PLATELET  PROTIME-INR  TSH  GLUCOSE, CAPILLARY  HEMOGLOBIN A1C  URINALYSIS, ROUTINE W REFLEX MICROSCOPIC (NOT AT [facility])  I-STAT TROPOININ, ED    EKG  EKG Interpretation  Date/Time:  Sunday December 23 2015 16:21:55 EDT Ventricular Rate:  98 PR Interval:    QRS Duration: 173 QT Interval:  455 QTC Calculation: 581 R Axis:   -42 Text Interpretation:  Ventricular-paced complexes No previous ECGs available Confirmed by [facility] MD, Arsen Mangione ([id number]) on 12/24/2015 3:48:42 PM       Radiology Ct Head Wo Contrast  Result Date: 12/23/2015 CLINICAL DATA:  Un witnessed fall with dizziness and neck pain, initial encounter EXAM: CT HEAD WITHOUT CONTRAST CT CERVICAL SPINE WITHOUT CONTRAST TECHNIQUE: Multidetector CT imaging of the head and cervical spine was performed following the standard protocol without intravenous contrast. Multiplanar CT image reconstructions of the cervical spine were also generated. COMPARISON:  None. FINDINGS: CT HEAD FINDINGS The bony calvarium is intact. Mild mucosal changes are noted in the left maxillary antrum. Diffuse atrophic changes are seen. No findings to suggest acute hemorrhage, acute infarction or space-occupying mass lesion are noted. CT CERVICAL SPINE FINDINGS Seven cervical segments are well visualized. Vertebral body height is well maintained. Disc space narrowing is noted at C5-6 and C6-7 with associated osteophytic changes. Multilevel facet hypertrophic changes are seen. No acute fracture or acute facet abnormality is noted. The visualized lung apices demonstrate mild scarring bilaterally. A small left  pleural effusion is seen. IMPRESSION: CT of the head: Chronic atrophic changes without acute abnormality. CT of the cervical spine: Multilevel degenerative changes without acute bony abnormality. Small left  pleural effusion is seen. Electronically Signed   By: Alcide Clever M.D.   On: 12/23/2015 19:02   Ct Angio Chest Pe W And/or Wo Contrast  Result Date: 12/23/2015 CLINICAL DATA:  Recent fall with chest pain and shortness of Breath EXAM: CT ANGIOGRAPHY CHEST WITH CONTRAST TECHNIQUE: Multidetector CT imaging of the chest was performed using the standard protocol during bolus administration of intravenous contrast. Multiplanar CT image reconstructions and MIPs were obtained to evaluate the vascular anatomy. CONTRAST:  80 mL Isovue 370. COMPARISON:  None. FINDINGS: Mediastinum/Lymph Nodes: No significant hilar or mediastinal adenopathy is identified. The thoracic inlet is within normal limits. Cardiovascular: Diffuse aortic calcifications are seen without aneurysmal dilatation or dissection. The pulmonary artery shows a normal branching pattern without intraluminal filling defect to suggest pulmonary embolism. Coronary calcifications are noted. Mitral valve replacement is seen Lungs/Pleura: Mild left lower lobe atelectasis is noted with associated small effusion. No pneumothorax is seen. Upper abdomen: No acute abnormality noted. Musculoskeletal: Mildly displaced fractures of the left seventh and eighth ribs Review of the MIP images confirms the above findings. IMPRESSION: No evidence of pulmonary emboli. Fractures of the left seventh and eighth ribs without pneumothorax. Associated left lower lobe atelectasis and effusion is noted. Electronically Signed   By: Alcide Clever M.D.   On: 12/23/2015 19:10   Ct Cervical Spine Wo Contrast  Result Date: 12/23/2015 CLINICAL DATA:  Un witnessed fall with dizziness and neck pain, initial encounter EXAM: CT HEAD WITHOUT CONTRAST CT CERVICAL SPINE WITHOUT CONTRAST TECHNIQUE:  Multidetector CT imaging of the head and cervical spine was performed following the standard protocol without intravenous contrast. Multiplanar CT image reconstructions of the cervical spine were also generated. COMPARISON:  None. FINDINGS: CT HEAD FINDINGS The bony calvarium is intact. Mild mucosal changes are noted in the left maxillary antrum. Diffuse atrophic changes are seen. No findings to suggest acute hemorrhage, acute infarction or space-occupying mass lesion are noted. CT CERVICAL SPINE FINDINGS Seven cervical segments are well visualized. Vertebral body height is well maintained. Disc space narrowing is noted at C5-6 and C6-7 with associated osteophytic changes. Multilevel facet hypertrophic changes are seen. No acute fracture or acute facet abnormality is noted. The visualized lung apices demonstrate mild scarring bilaterally. A small left pleural effusion is seen. IMPRESSION: CT of the head: Chronic atrophic changes without acute abnormality. CT of the cervical spine: Multilevel degenerative changes without acute bony abnormality. Small left pleural effusion is seen. Electronically Signed   By: Alcide Clever M.D.   On: 12/23/2015 19:02    Procedures Procedures (including critical care time)  Medications Ordered in ED Medications  albuterol (PROVENTIL) (2.5 MG/3ML) 0.083% nebulizer solution 3 mL (not administered)  polyvinyl alcohol (LIQUIFILM TEARS) 1.4 % ophthalmic solution 1 drop (1 drop Both Eyes Given 12/24/15 1118)  ciclopirox (LOPROX) 0.77 % cream 1 application (not administered)  diltiazem (CARDIZEM CD) 24 hr capsule 120 mg (120 mg Oral Given 12/23/15 2248)  mometasone-formoterol (DULERA) 200-5 MCG/ACT inhaler 2 puff (2 puffs Inhalation Given 12/24/15 0948)  furosemide (LASIX) tablet 10 mg (10 mg Oral Given 12/24/15 1117)  ipratropium (ATROVENT) nebulizer solution 0.5 mg (not administered)  levothyroxine (SYNTHROID, LEVOTHROID) tablet 100 mcg (100 mcg Oral Given 12/24/15 0842)  loratadine  (CLARITIN) tablet 10 mg (10 mg Oral Given 12/24/15 1117)  losartan (COZAAR) tablet 25 mg (25 mg Oral Given 12/24/15 1118)  MUSCLE RUB CREA 1 application (1 application Topical Given 12/24/15 1119)  pantoprazole (PROTONIX) EC tablet 40 mg (40 mg Oral Given  12/24/15 1117)  psyllium (HYDROCIL/METAMUCIL) packet 1 packet (1 packet Oral Given 12/23/15 2248)  enoxaparin (LOVENOX) injection 40 mg (40 mg Subcutaneous Given 12/23/15 2249)  acetaminophen (TYLENOL) suppository 650 mg (not administered)  docusate sodium (COLACE) capsule 100 mg (100 mg Oral Given 12/24/15 1117)  ondansetron (ZOFRAN) tablet 4 mg (not administered)    Or  ondansetron (ZOFRAN) injection 4 mg (not administered)  sodium chloride flush (NS) 0.9 % injection 3 mL (3 mLs Intravenous Given 12/24/15 1119)  ketorolac (TORADOL) 15 MG/ML injection 15 mg (15 mg Intravenous Given 12/24/15 0517)  insulin aspart (novoLOG) injection 0-15 Units (0 Units Subcutaneous Not Given 12/24/15 1358)  acetaminophen (TYLENOL) tablet 1,000 mg (1,000 mg Oral Given 12/24/15 1324)  0.9 %  sodium chloride infusion ( Intravenous New Bag/Given 12/24/15 1159)  oxyCODONE (Oxy IR/ROXICODONE) immediate release tablet 5 mg (not administered)  sertraline (ZOLOFT) tablet 50 mg (not administered)  DULoxetine (CYMBALTA) DR capsule 20 mg (not administered)  iopamidol (ISOVUE-370) 76 % injection 100 mL (80 mLs Intravenous Contrast Given 12/23/15 1831)  fentaNYL (SUBLIMAZE) injection 25 mcg (25 mcg Intravenous Given 12/23/15 1948)  furosemide (LASIX) injection 20 mg (20 mg Intravenous Given 12/23/15 1950)  potassium chloride SA (K-DUR,KLOR-CON) CR tablet 40 mEq (40 mEq Oral Given 12/24/15 0659)  sodium chloride 0.9 % bolus 500 mL (500 mLs Intravenous Given 12/24/15 1158)     Initial Impression / Assessment and Plan / ED Course  I have reviewed the triage vital signs and the nursing notes.  Pertinent labs & imaging results that were available during my care of the patient were reviewed by me and  considered in my medical decision making (see chart for details).  Clinical Course   82yo female with history of atrial fibrillation, pacemaker, not on anticoagulation, CHF, htn, COPD who presents with concern for dizziness, fall, left sided rib pain, shortness of breath.  Dizziness has been happening for years per daughter. Placed order to interrogate pacemaker. No anemia. No significant electrolyte abnormalities to account for these symptoms. Normal neuro exam, doubt CVA.  Troponin negative.  BNP 855, no prior values, however suspect some fluid overload and pt given lasix.  CT head/CSpine show no sign of inury from fall.  CT PE study shows 2 rib fx with reactive effusion. Pt given pain medications, ordered incentive spirometry. Will admit for pulmonary toilet, pain control, likely diuresis and monitoring.  Final Clinical Impressions(s) / ED Diagnoses   Final diagnoses:  Rib fractures, left, sequela  Pleural effusion  Congestive heart failure, unspecified congestive heart failure chronicity, unspecified congestive heart failure type [facility])    New Prescriptions Current Discharge Medication List       Alvira Monday, MD 12/24/15 [phone]

## 2015-12-23 NOTE — Progress Notes (Signed)
PHARMACIST - PHYSICIAN ORDER COMMUNICATION  CONCERNING: P&T Medication Policy on Herbal Medications  DESCRIPTION:  This patient's order for:  melatonin  has been noted.  This product(s) is classified as an "herbal" or natural product. Due to a lack of definitive safety studies or FDA approval, nonstandard manufacturing practices, plus the potential risk of unknown drug-drug interactions while on inpatient medications, the Pharmacy and Therapeutics Committee does not permit the use of "herbal" or natural products of this type within [facility].   ACTION TAKEN: The pharmacy department is unable to verify this order at this time and your patient has been informed of this safety policy. Please reevaluate patient's clinical condition at discharge and address if the herbal or natural product(s) should be resumed at that time.   Thanks Lorenza Evangelist 12/23/2015 9:16 PM

## 2015-12-23 NOTE — ED Notes (Signed)
Bed: JJ88 Expected date:  Expected time:  Means of arrival:  Comments:  82 yo Fall, L flank pain

## 2015-12-24 ENCOUNTER — Encounter (HOSPITAL_COMMUNITY): Payer: Self-pay | Admitting: *Deleted

## 2015-12-24 DIAGNOSIS — W1830XA Fall on same level, unspecified, initial encounter: Secondary | ICD-10-CM | POA: Diagnosis present

## 2015-12-24 DIAGNOSIS — W19XXXA Unspecified fall, initial encounter: Secondary | ICD-10-CM | POA: Diagnosis not present

## 2015-12-24 DIAGNOSIS — R296 Repeated falls: Secondary | ICD-10-CM | POA: Diagnosis present

## 2015-12-24 DIAGNOSIS — S2242XA Multiple fractures of ribs, left side, initial encounter for closed fracture: Secondary | ICD-10-CM | POA: Diagnosis present

## 2015-12-24 DIAGNOSIS — Y92091 Bathroom in other non-institutional residence as the place of occurrence of the external cause: Secondary | ICD-10-CM | POA: Diagnosis not present

## 2015-12-24 DIAGNOSIS — J449 Chronic obstructive pulmonary disease, unspecified: Secondary | ICD-10-CM | POA: Diagnosis present

## 2015-12-24 DIAGNOSIS — Z9049 Acquired absence of other specified parts of digestive tract: Secondary | ICD-10-CM | POA: Diagnosis not present

## 2015-12-24 DIAGNOSIS — I110 Hypertensive heart disease with heart failure: Secondary | ICD-10-CM | POA: Diagnosis present

## 2015-12-24 DIAGNOSIS — E039 Hypothyroidism, unspecified: Secondary | ICD-10-CM | POA: Diagnosis present

## 2015-12-24 DIAGNOSIS — E876 Hypokalemia: Secondary | ICD-10-CM | POA: Diagnosis present

## 2015-12-24 DIAGNOSIS — F332 Major depressive disorder, recurrent severe without psychotic features: Secondary | ICD-10-CM

## 2015-12-24 DIAGNOSIS — F339 Major depressive disorder, recurrent, unspecified: Secondary | ICD-10-CM | POA: Diagnosis present

## 2015-12-24 DIAGNOSIS — E860 Dehydration: Secondary | ICD-10-CM | POA: Diagnosis present

## 2015-12-24 DIAGNOSIS — W19XXXD Unspecified fall, subsequent encounter: Secondary | ICD-10-CM | POA: Diagnosis not present

## 2015-12-24 DIAGNOSIS — S2232XS Fracture of one rib, left side, sequela: Secondary | ICD-10-CM | POA: Diagnosis not present

## 2015-12-24 DIAGNOSIS — Z952 Presence of prosthetic heart valve: Secondary | ICD-10-CM | POA: Diagnosis not present

## 2015-12-24 DIAGNOSIS — I509 Heart failure, unspecified: Secondary | ICD-10-CM | POA: Diagnosis present

## 2015-12-24 DIAGNOSIS — R55 Syncope and collapse: Secondary | ICD-10-CM | POA: Diagnosis present

## 2015-12-24 DIAGNOSIS — E871 Hypo-osmolality and hyponatremia: Secondary | ICD-10-CM | POA: Diagnosis present

## 2015-12-24 DIAGNOSIS — R627 Adult failure to thrive: Secondary | ICD-10-CM | POA: Diagnosis present

## 2015-12-24 DIAGNOSIS — S2232XD Fracture of one rib, left side, subsequent encounter for fracture with routine healing: Secondary | ICD-10-CM | POA: Diagnosis not present

## 2015-12-24 DIAGNOSIS — R0781 Pleurodynia: Secondary | ICD-10-CM | POA: Diagnosis present

## 2015-12-24 DIAGNOSIS — E119 Type 2 diabetes mellitus without complications: Secondary | ICD-10-CM | POA: Diagnosis present

## 2015-12-24 DIAGNOSIS — F331 Major depressive disorder, recurrent, moderate: Secondary | ICD-10-CM | POA: Diagnosis not present

## 2015-12-24 DIAGNOSIS — Z79899 Other long term (current) drug therapy: Secondary | ICD-10-CM | POA: Diagnosis not present

## 2015-12-24 DIAGNOSIS — I10 Essential (primary) hypertension: Secondary | ICD-10-CM | POA: Diagnosis not present

## 2015-12-24 DIAGNOSIS — I4891 Unspecified atrial fibrillation: Secondary | ICD-10-CM | POA: Diagnosis present

## 2015-12-24 LAB — GLUCOSE, CAPILLARY
GLUCOSE-CAPILLARY: 98 mg/dL (ref 65–99)
GLUCOSE-CAPILLARY: 104 mg/dL — AB (ref 65–99)
GLUCOSE-CAPILLARY: 118 mg/dL — AB (ref 65–99)
GLUCOSE-CAPILLARY: 127 mg/dL — AB (ref 65–99)

## 2015-12-24 LAB — MRSA PCR SCREENING: MRSA by PCR: NEGATIVE

## 2015-12-24 LAB — BASIC METABOLIC PANEL
Anion gap: 10 (ref 5–15)
BUN: 17 mg/dL (ref 6–20)
CHLORIDE: 96 mmol/L — AB (ref 101–111)
CO2: 26 mmol/L (ref 22–32)
Calcium: 8.9 mg/dL (ref 8.9–10.3)
Creatinine, Ser: 0.83 mg/dL (ref 0.44–1.00)
GFR calc non Af Amer: >60 mL/min (ref >60)
Glucose, Bld: 94 mg/dL (ref 65–99)
POTASSIUM: 3.1 mmol/L — AB (ref 3.5–5.1)
SODIUM: 132 mmol/L — AB (ref 135–145)

## 2015-12-24 LAB — CBC
HEMATOCRIT: 35.7 % — AB (ref 36.0–46.0)
HEMOGLOBIN: 12.4 g/dL (ref 12.0–15.0)
MCH: 27.7 pg (ref 26.0–34.0)
MCHC: 34.7 g/dL (ref 30.0–36.0)
MCV: 79.7 fL (ref 78.0–100.0)
Platelets: 236 10*3/uL (ref 150–400)
RBC: 4.48 MIL/uL (ref 3.87–5.11)
RDW: 13.6 % (ref 11.5–15.5)
WBC: 7.5 10*3/uL (ref 4.0–10.5)

## 2015-12-24 LAB — TSH: TSH: 1.517 u[IU]/mL (ref 0.350–4.500)

## 2015-12-24 LAB — T4, FREE: FREE T4: 1.22 ng/dL — AB (ref 0.61–1.12)

## 2015-12-24 MED ORDER — POTASSIUM CHLORIDE CRYS ER 20 MEQ PO TBCR
40.0000 meq | EXTENDED_RELEASE_TABLET | Freq: Once | ORAL | Status: AC
  Administered 2015-12-24: 40 meq via ORAL
  Filled 2015-12-24: qty 2

## 2015-12-24 MED ORDER — SODIUM CHLORIDE 0.9 % IV SOLN
INTRAVENOUS | Status: DC
  Administered 2015-12-24 – 2015-12-25 (×3): via INTRAVENOUS

## 2015-12-24 MED ORDER — DULOXETINE HCL 20 MG PO CPEP
20.0000 mg | ORAL_CAPSULE | Freq: Every day | ORAL | Status: DC
  Administered 2015-12-24 – 2015-12-25 (×2): 20 mg via ORAL
  Filled 2015-12-24 (×2): qty 1

## 2015-12-24 MED ORDER — SERTRALINE HCL 50 MG PO TABS
50.0000 mg | ORAL_TABLET | Freq: Every day | ORAL | Status: DC
  Administered 2015-12-25 – 2015-12-27 (×3): 50 mg via ORAL
  Filled 2015-12-24 (×3): qty 1

## 2015-12-24 MED ORDER — SODIUM CHLORIDE 0.9 % IV BOLUS (SEPSIS)
500.0000 mL | Freq: Once | INTRAVENOUS | Status: AC
  Administered 2015-12-24: 500 mL via INTRAVENOUS

## 2015-12-24 MED ORDER — ACETAMINOPHEN 500 MG PO TABS
1000.0000 mg | ORAL_TABLET | Freq: Three times a day (TID) | ORAL | Status: DC
  Administered 2015-12-24 – 2015-12-27 (×9): 1000 mg via ORAL
  Filled 2015-12-24 (×9): qty 2

## 2015-12-24 MED ORDER — OXYCODONE HCL 5 MG PO TABS
5.0000 mg | ORAL_TABLET | Freq: Four times a day (QID) | ORAL | Status: DC | PRN
  Administered 2015-12-27: 5 mg via ORAL
  Filled 2015-12-24: qty 1

## 2015-12-24 NOTE — Plan of Care (Signed)
Problem: Skin Integrity: Goal: Risk for impaired skin integrity will decrease Skin intact. Continue to monitor and reposition.   Continue with good peri care.   Problem: Nutrition: Goal: Adequate nutrition will be maintained Refused to order breakfast.  Ate most of a grilled cheese sandwich for lunch.  Continue to encourage PO intake.

## 2015-12-24 NOTE — Clinical Social Work Note (Signed)
Clinical Social Work Assessment  Patient Details  Name: Kathy Peterson MRN: [id number] Date of Birth: 1926-09-26  Date of referral:  12/24/15               Reason for consult:  Facility Placement, Mental Health Concerns                Permission sought to share information with:  Case Manager, Customer service manager, Psychiatrist, Family Supports Permission granted to share information::     Name::        Agency::  [facility]  Relationship::  Adult Children  Contact Information:  Kathy Peterson [phone] , Son:   Housing/Transportation Living arrangements for the past 2 months:  [place] [facility] ) Source of Information:  Adult Children, Patient, Psychiatric Consultation Patient Interpreter Needed:  None Criminal Activity/Legal Involvement Pertinent to Current Situation/Hospitalization:  No - Comment as needed Significant Relationships:  Adult Children, Community Support Lives with:  Facility Resident Do you feel safe going back to the place where you live?  Yes Need for family participation in patient care:  Yes (Patient is confused at time)  Care giving concerns:  Patient and pt. Son report she has been living at [facility] for the past nine months. Prior to the facility patient lived with her adult children and grandchildren. Patient reports she feels depressed at times. She reports she has difficulty sleeping at times. Patient denies SI/HI. Psychiatrist to assist patient with medication management. LCSWA offered patient emotional support and will assist patient back to ALF.      Social Worker assessment / plan:  LCSWA, Psychiatrist MD met patient and son at bedside explained reason for consult as medication management for depression and ALF return. Patient receptive to assessment. Patient expressed having depression and not feeling like to doing much. Patient reports she has been taking medication but does not feel like it has been  working. Patient son expressed patient has been depressed for sometime after moving out of the home with her husband whom he reports was not very nice to his mother. Patient reports, " having my youngins around is what makes me happy."  LCSWA receive collateral information from facility. [name] patient RN reports, the patient family reports the patient is depressed. The patient has seen a psychiatrist at the facility. Patient is taking Zoloft 126m.   Employment status:  Retired IForensic scientist  Other (Comment Required) (Nurse, mental health PT Recommendations:  Not assessed at this time Information / Referral to community resources:     Patient/Family's Response to care:  Patient/Family's Understanding of and Emotional Response to Diagnosis, Current Treatment, and Prognosis:  "Like anyone she has her ups and downs, she has been doing a lot better since moving in with her children and then ALF." Son reports, patient has  trouble with her hip, and reports falls in the past due to polio as a child. Patient is receptive to medication management.   Emotional Assessment Appearance:  Appears stated age Attitude/Demeanor/Rapport:    Affect (typically observed):  Accepting, Appropriate Orientation:  Oriented to Self, Oriented to Place, Oriented to  Time, Oriented to Situation Alcohol / Substance use:  Not Applicable Psych involvement (Current and /or in the community):  Yes (Medication Adjustment)  Discharge Needs  Concerns to be addressed:  Mental Health Concerns, Medication Concerns Readmission within the last 30 days:    Current discharge risk:  None Barriers to Discharge:  No Barriers Identified   NLia Hopping LCSW 12/24/2015, 2:35 PM

## 2015-12-24 NOTE — Evaluation (Signed)
Physical Therapy Evaluation Patient Details Name: Kathy Peterson MRN: [id number] DOB: 02/25/1927 Today's Date: 12/24/2015   History of Present Illness  82 yo female admitted with dizziness, L 7-8 rib fxs after sustaining a fall at senior living facility. Hx of A fib, polio, DM, CHF, COPD, HTN, chronic dizziness.   Clinical Impression  On eval, pt required Mod assist for mobility. 2 attempts needed to get to standing due to dizziness. Pt was able to perform stand pivot, bed to recliner, with use of RW. BP during session: supine-153/58, sitting-159/99, standing-138/87 (standing measurement was taken after pt abruptly sat back down onto bed due to dizziness). Pt reports history of polio affecting R side as well. Recommend SNF for continued rehab.     Follow Up Recommendations SNF    Equipment Recommendations  None recommended by PT    Recommendations for Other Services       Precautions / Restrictions Precautions Precautions: Fall Restrictions Weight Bearing Restrictions: No      Mobility  Bed Mobility Overal bed mobility: Needs Assistance Bed Mobility: Supine to Sit     Supine to sit: [facility] elevated;Min assist     General bed mobility comments: Increased time. Heavy reliance on bedrail. L sided bias/modified technique when transitioning due to R side deficits (2 hands on bedrail on L hand side). Assist needed for trunk and to stabilize at EOB.   Transfers Overall transfer level: Needs assistance   Transfers: Sit to/from Stand;Stand Pivot Transfers Sit to Stand: Mod assist Stand pivot transfers: Min assist       General transfer comment: Assist to rise, stabilize, control descent. L sided bias/modified technique when transitioning due to R side strength/ROM deficits (2 hands on bedrail on L side). Increased time. On 1st attempt pt stood for ~10 seconds before abruptly returning to sitting position, due to dizziness. On 2nd attempt, pt was able to maintain standing and pivot to  recliner with use of RW. Increased time.   Ambulation/Gait             General Gait Details: NT-unable to safely attempt due to dizziness and +2 assist unavailable  Stairs            Wheelchair Mobility    Modified Rankin (Stroke Patients Only)       Balance Overall balance assessment: History of Falls;Needs assistance         Standing balance support: Bilateral upper extremity supported Standing balance-Leahy Scale: Poor                               Pertinent Vitals/Pain Pain Assessment: Faces Faces Pain Scale: Hurts even more Pain Location: L side especially with coughing Pain Descriptors / Indicators: Sore Pain Intervention(s): Limited activity within patient's tolerance    Home Living Family/patient expects to be discharged to:: Skilled nursing facility                      Prior Function Level of Independence: Needs assistance   Gait / Transfers Assistance Needed: uses walker for ambulation  ADL's / Homemaking Assistance Needed: receives assistance for dressing        Hand Dominance        Extremity/Trunk Assessment   Upper Extremity Assessment: Generalized weakness           Lower Extremity Assessment: Generalized weakness;RLE deficits/detail RLE Deficits / Details: ROm and strength deficits due to history of polio  Communication   Communication: No difficulties  Cognition Arousal/Alertness: Awake/alert Behavior During Therapy: WFL for tasks assessed/performed Overall Cognitive Status: Within Functional Limits for tasks assessed                      General Comments      Exercises        Assessment/Plan    PT Assessment Patient needs continued PT services  PT Diagnosis Difficulty walking;Abnormality of gait;Generalized weakness   PT Problem List Decreased strength;Decreased range of motion;Decreased activity tolerance;Decreased balance;Decreased mobility;Decreased knowledge of use of  DME;Pain  PT Treatment Interventions Gait training;DME instruction;Functional mobility training;Therapeutic exercise;Therapeutic activities;Balance training;Patient/family education   PT Goals (Current goals can be found in the Care Plan section) Acute Rehab PT Goals Patient Stated Goal: to get better PT Goal Formulation: With patient Time For Goal Achievement: 01/07/16 Potential to Achieve Goals: Good    Frequency Min 3X/week   Barriers to discharge        Co-evaluation               End of Session Equipment Utilized During Treatment: Gait belt Activity Tolerance: Patient limited by pain (Limited by dizziness) Patient left: in chair;with call bell/phone within reach;with chair alarm set      Functional Assessment Tool Used: clinical judgement Functional Limitation: Mobility: Walking and moving around Mobility: Walking and Moving Around Current Status [phone]): At least 20 percent but less than 40 percent impaired, limited or restricted Mobility: Walking and Moving Around Goal Status [phone]): At least 1 percent but less than 20 percent impaired, limited or restricted    Time: 1425-1503 PT Time Calculation (min) (ACUTE ONLY): 38 min   Charges:   PT Evaluation $PT Eval Low Complexity: 1 Procedure PT Treatments $Therapeutic Activity: 23-37 mins   PT G Codes:   PT G-Codes **NOT FOR INPATIENT CLASS** Functional Assessment Tool Used: clinical judgement Functional Limitation: Mobility: Walking and moving around Mobility: Walking and Moving Around Current Status (E0814): At least 20 percent but less than 40 percent impaired, limited or restricted Mobility: Walking and Moving Around Goal Status [phone]): At least 1 percent but less than 20 percent impaired, limited or restricted    Rebeca Alert, MPT Pager: [phone]

## 2015-12-24 NOTE — Consult Note (Signed)
[facility] Consult   Reason for Consult:  Chronic depression Referring Physician:  Dr. Coralyn Pear Patient Identification: Kathy Peterson MRN:  [id number] Principal Diagnosis: Rib fractures Diagnosis:   Patient Active Problem List   Diagnosis Date Noted  . Hyponatremia [E87.1] 12/24/2015  . Rib fractures [S22.39XA] 12/23/2015  . Atrial fibrillation ([place]) [I48.91] 12/23/2015  . CHF (congestive heart failure) ([place]) [I50.9] 12/23/2015  . COPD (chronic obstructive pulmonary disease) ([place]) [J44.9] 12/23/2015  . Diabetes mellitus, type 2 ([place]) [E11.9] 12/23/2015  . Essential (primary) hypertension [I10] 12/23/2015  . Hypothyroidism [E03.9] 12/23/2015  . Falls [phone] 12/23/2015    Total Time spent with patient: 45 minutes  Subjective:   Kathy Peterson is a 82 y.o. female patient admitted with depression  HPI:   Kathy Peterson is a 82 y.o. female with medical history significant of afib, DM,CHF (NOS), COPD, HTN presenting with falls. Received psychiatric consultation for medication management of depression. Patient mental face-to-face for this evaluation and information for this evaluation obtained from face-to-face evaluation with the patient and patient's son who is at bedside. Patient is a poor historian and patient son reported patient has been under significant stress from her husband who has been very angry man. Patient was relocated to one of her son but they could not take care of her which resulted placed in a a single living facility called [facility] about few months ago. Patient reportedly has increased symptoms of depression, dysphoria, disturbed sleep and appetite, isolation and somewhat withdrawn. Patient denies active suicidal or homicidal ideation, intention or plans. Patient has no evidence of psychosis. Patient endorses dizziness and recent fall and it is not very clear it is secondary to dehydration or cardiac problems like atrial fibrillation. Patient son stated that  patient has not responded to her current medication and willing to try different medication.   Past Psychiatric History: Patient has been suffering with chronic depression and has been deceived medication management from [facility] where she has been staying for a few months.  Risk to Self: Is patient at risk for suicide?: No Risk to Others:   Prior Inpatient Therapy:   Prior Outpatient Therapy:    Past Medical History:  Past Medical History:  Diagnosis Date  . Arthritis    rheumatoid  . Atrial fibrillation ([place])   . CHF (congestive heart failure) ([place])   . COPD (chronic obstructive pulmonary disease) ([place])   . Depression   . Diabetes mellitus without complication ([place])    type 2  . GERD (gastroesophageal reflux disease)   . Hypertension   . Pacemaker   . Pneumonia   . Polio   . Shortness of breath   . Thyroid disease    hypothyroid    Past Surgical History:  Procedure Laterality Date  . ABDOMINAL HYSTERECTOMY    . CHOLECYSTECTOMY    . EYE SURGERY     Family History:  Family History  Problem Relation Age of Onset  . Family history unknown: Yes   Family Psychiatric  History: Not significant for mental illness.  Social History:  History  Alcohol Use No     History  Drug Use No    Social History   Social History  . Marital status: Widowed    Spouse name: N/A  . Number of children: N/A  . Years of education: N/A   Occupational History  . retired    Social History Main Topics  . Smoking status: Never Smoker  . Smokeless tobacco: Never  Used  . Alcohol use No  . Drug use: No  . Sexual activity: No   Other Topics Concern  . None   Social History Narrative  . None   Additional Social History:    Allergies:   Allergies  Allergen Reactions  . Penicillins Other (See Comments)    Reaction:  Unknown  Has patient had a PCN reaction causing immediate rash, facial/tongue/throat swelling, SOB or lightheadedness with  hypotension: Unsure Has patient had a PCN reaction causing severe rash involving mucus membranes or skin necrosis: Unsure Has patient had a PCN reaction that required hospitalization Unsure Has patient had a PCN reaction occurring within the last 10 years: Unsure If all of the above answers are "NO", then may proceed with Cephalosporin use.  . Pneumovax [Pneumococcal Polysaccharide Vaccine] Other (See Comments)    Reaction:  Unknown   . Statins Other (See Comments)    Reaction:  Unknown   . Sulfa Antibiotics Other (See Comments)    Reaction:  Unknown     Labs:  Results for orders placed or performed during the hospital encounter of 12/23/15 (from the past 48 hour(s))  CBG monitoring, ED     Status: Abnormal   Collection Time: 12/23/15  4:20 PM  Result Value Ref Range   Glucose-Capillary 137 (H) 65 - 99 mg/dL   Comment 1 Notify RN    Comment 2 Document in Chart   Protime-INR     Status: None   Collection Time: 12/23/15  5:00 PM  Result Value Ref Range   Prothrombin Time 13.6 11.4 - 15.2 seconds   INR 1.04   CBC with Differential     Status: None   Collection Time: 12/23/15  5:10 PM  Result Value Ref Range   WBC 7.5 4.0 - 10.5 K/uL   RBC 4.98 3.87 - 5.11 MIL/uL   Hemoglobin 13.5 12.0 - 15.0 g/dL   HCT 39.8 36.0 - 46.0 %   MCV 79.9 78.0 - 100.0 fL   MCH 27.1 26.0 - 34.0 pg   MCHC 33.9 30.0 - 36.0 g/dL   RDW 13.6 11.5 - 15.5 %   Platelets 245 150 - 400 K/uL   Neutrophils Relative % 75 %   Neutro Abs 5.6 1.7 - 7.7 K/uL   Lymphocytes Relative 12 %   Lymphs Abs 0.9 0.7 - 4.0 K/uL   Monocytes Relative 12 %   Monocytes Absolute 0.9 0.1 - 1.0 K/uL   Eosinophils Relative 1 %   Eosinophils Absolute 0.1 0.0 - 0.7 K/uL   Basophils Relative 0 %   Basophils Absolute 0.0 0.0 - 0.1 K/uL  Comprehensive metabolic panel     Status: Abnormal   Collection Time: 12/23/15  5:10 PM  Result Value Ref Range   Sodium 132 (L) 135 - 145 mmol/L   Potassium 3.7 3.5 - 5.1 mmol/L   Chloride 96 (L)  101 - 111 mmol/L   CO2 26 22 - 32 mmol/L   Glucose, Bld 126 (H) 65 - 99 mg/dL   BUN 14 6 - 20 mg/dL   Creatinine, Ser 0.94 0.44 - 1.00 mg/dL   Calcium 9.3 8.9 - 10.3 mg/dL   Total Protein 6.7 6.5 - 8.1 g/dL   Albumin 4.1 3.5 - 5.0 g/dL   AST 17 15 - 41 U/L   ALT 13 (L) 14 - 54 U/L   Alkaline Phosphatase 79 38 - 126 U/L   Total Bilirubin 0.7 0.3 - 1.2 mg/dL   GFR calc non Af  Amer 53 (L) >60 mL/min   GFR calc Af Amer >60 >60 mL/min    Comment: (NOTE) The eGFR has been calculated using the CKD EPI equation. This calculation has not been validated in all clinical situations. eGFR's persistently <60 mL/min signify possible Chronic Kidney Disease.    Anion gap 10 5 - 15  Brain natriuretic peptide     Status: Abnormal   Collection Time: 12/23/15  5:10 PM  Result Value Ref Range   B Natriuretic Peptide 855.7 (H) 0.0 - 100.0 pg/mL  I-Stat Troponin, ED - 0, 3, 6 hours (not at [facility])     Status: None   Collection Time: 12/23/15  5:18 PM  Result Value Ref Range   Troponin i, poc 0.02 0.00 - 0.08 ng/mL   Comment 3            Comment: Due to the release kinetics of cTnI, a negative result within the first hours of the onset of symptoms does not rule out myocardial infarction with certainty. If myocardial infarction is still suspected, repeat the test at appropriate intervals.   MRSA PCR Screening     Status: None   Collection Time: 12/23/15  9:14 PM  Result Value Ref Range   MRSA by PCR NEGATIVE NEGATIVE    Comment:        The GeneXpert MRSA Assay (FDA approved for NASAL specimens only), is one component of a comprehensive MRSA colonization surveillance program. It is not intended to diagnose MRSA infection nor to guide or monitor treatment for MRSA infections.   Glucose, capillary     Status: Abnormal   Collection Time: 12/23/15 11:10 PM  Result Value Ref Range   Glucose-Capillary 116 (H) 65 - 99 mg/dL  Basic metabolic panel     Status: Abnormal   Collection Time: 12/24/15   4:28 AM  Result Value Ref Range   Sodium 132 (L) 135 - 145 mmol/L   Potassium 3.1 (L) 3.5 - 5.1 mmol/L   Chloride 96 (L) 101 - 111 mmol/L   CO2 26 22 - 32 mmol/L   Glucose, Bld 94 65 - 99 mg/dL   BUN 17 6 - 20 mg/dL   Creatinine, Ser 0.83 0.44 - 1.00 mg/dL   Calcium 8.9 8.9 - 10.3 mg/dL   GFR calc non Af Amer >60 >60 mL/min   GFR calc Af Amer >60 >60 mL/min    Comment: (NOTE) The eGFR has been calculated using the CKD EPI equation. This calculation has not been validated in all clinical situations. eGFR's persistently <60 mL/min signify possible Chronic Kidney Disease.    Anion gap 10 5 - 15  CBC     Status: Abnormal   Collection Time: 12/24/15  4:28 AM  Result Value Ref Range   WBC 7.5 4.0 - 10.5 K/uL   RBC 4.48 3.87 - 5.11 MIL/uL   Hemoglobin 12.4 12.0 - 15.0 g/dL   HCT 35.7 (L) 36.0 - 46.0 %   MCV 79.7 78.0 - 100.0 fL   MCH 27.7 26.0 - 34.0 pg   MCHC 34.7 30.0 - 36.0 g/dL   RDW 13.6 11.5 - 15.5 %   Platelets 236 150 - 400 K/uL  TSH     Status: None   Collection Time: 12/24/15  6:52 AM  Result Value Ref Range   TSH 1.517 0.350 - 4.500 uIU/mL  T4, free     Status: Abnormal   Collection Time: 12/24/15  6:52 AM  Result Value Ref Range   Free  T4 1.22 (H) 0.61 - 1.12 ng/dL    Comment: (NOTE) Biotin ingestion may interfere with free T4 tests. If the results are inconsistent with the TSH level, previous test results, or the clinical presentation, then consider biotin interference. If needed, order repeat testing after stopping biotin. Performed at [facility]   Glucose, capillary     Status: None   Collection Time: 12/24/15  7:13 AM  Result Value Ref Range   Glucose-Capillary 98 65 - 99 mg/dL   Comment 1 Notify RN    Comment 2 Document in Chart     Current Facility-Administered Medications  Medication Dose Route Frequency Provider Last Rate Last Dose  . 0.9 %  sodium chloride infusion   Intravenous Continuous Kelvin Cellar, MD 75 mL/hr at 12/24/15 1159     . acetaminophen (TYLENOL) suppository 650 mg  650 mg Rectal Q6H PRN Karmen Bongo, MD      . acetaminophen (TYLENOL) tablet 1,000 mg  1,000 mg Oral Q8H Kelvin Cellar, MD   1,000 mg at 12/24/15 1324  . albuterol (PROVENTIL) (2.5 MG/3ML) 0.083% nebulizer solution 3 mL  3 mL Inhalation Q6H PRN Karmen Bongo, MD      . ciclopirox (LOPROX) 8.45 % cream 1 application  1 application Topical BID PRN Karmen Bongo, MD      . diltiazem (CARDIZEM CD) 24 hr capsule 120 mg  120 mg Oral QHS Karmen Bongo, MD   120 mg at 12/23/15 2248  . docusate sodium (COLACE) capsule 100 mg  100 mg Oral BID Karmen Bongo, MD   100 mg at 12/24/15 1117  . DULoxetine (CYMBALTA) DR capsule 20 mg  20 mg Oral Daily Ambrose Finland, MD      . enoxaparin (LOVENOX) injection 40 mg  40 mg Subcutaneous QHS Karmen Bongo, MD   40 mg at 12/23/15 2249  . furosemide (LASIX) tablet 10 mg  10 mg Oral Daily Karmen Bongo, MD   10 mg at 12/24/15 1117  . insulin aspart (novoLOG) injection 0-15 Units  0-15 Units Subcutaneous TID WC Karmen Bongo, MD      . ipratropium (ATROVENT) nebulizer solution 0.5 mg  0.5 mg Nebulization Q6H PRN Karmen Bongo, MD      . ketorolac (TORADOL) 15 MG/ML injection 15 mg  15 mg Intravenous Q6H PRN Karmen Bongo, MD   15 mg at 12/24/15 0517  . levothyroxine (SYNTHROID, LEVOTHROID) tablet 100 mcg  100 mcg Oral QAC breakfast Karmen Bongo, MD   100 mcg at 12/24/15 [phone]  . loratadine (CLARITIN) tablet 10 mg  10 mg Oral Daily Karmen Bongo, MD   10 mg at 12/24/15 1117  . losartan (COZAAR) tablet 25 mg  25 mg Oral Daily Karmen Bongo, MD   25 mg at 12/24/15 1118  . mometasone-formoterol (DULERA) 200-5 MCG/ACT inhaler 2 puff  2 puff Inhalation BID Karmen Bongo, MD   2 puff at 12/24/15 [phone]  . MUSCLE RUB CREA 1 application  1 application Topical TID Karmen Bongo, MD   1 application at 05/11/81 1119  . ondansetron (ZOFRAN) tablet 4 mg  4 mg Oral Q6H PRN Karmen Bongo, MD       Or  . ondansetron  [facility]) injection 4 mg  4 mg Intravenous Q6H PRN Karmen Bongo, MD      . oxyCODONE (Oxy IR/ROXICODONE) immediate release tablet 5 mg  5 mg Oral Q6H PRN Kelvin Cellar, MD      . pantoprazole (PROTONIX) EC tablet 40 mg  40 mg Oral Daily Anderson Malta  Lorin Mercy, MD   40 mg at 12/24/15 1117  . polyvinyl alcohol (LIQUIFILM TEARS) 1.4 % ophthalmic solution 1 drop  1 drop Both Eyes Q12H Karmen Bongo, MD   1 drop at 12/24/15 1118  . psyllium (HYDROCIL/METAMUCIL) packet 1 packet  1 packet Oral QHS Karmen Bongo, MD   1 packet at 12/23/15 2248  . [START ON 12/25/2015] sertraline (ZOLOFT) tablet 50 mg  50 mg Oral Daily Ambrose Finland, MD      . sodium chloride flush (NS) 0.9 % injection 3 mL  3 mL Intravenous Q12H Karmen Bongo, MD   3 mL at 12/24/15 1119    Musculoskeletal: Strength & Muscle Tone: decreased Gait & Station: unable to stand Patient leans: N/A  Psychiatric Specialty Exam: Physical Exam as per history and physical  ROS depressed, generalized weakness, fatigue, loss of interest and poor appetite and sleep.  No Fever-chills, No Headache, No changes with Vision or hearing, reports vertigo No problems swallowing food or Liquids, No Chest pain, Cough or Shortness of Breath, No Abdominal pain, No Nausea or Vommitting, Bowel movements are regular, No Blood in stool or Urine, No dysuria, No new skin rashes or bruises, No new joints pains-aches,  No new weakness, tingling, numbness in any extremity, No recent weight gain or loss, No polyuria, polydypsia or polyphagia,   A full 10 point Review of Systems was done, except as stated above, all other Review of Systems were negative.  Blood pressure (!) 138/58, pulse 91, temperature 98.5 F (36.9 C), temperature source Oral, resp. rate (!) 22, height 5' 6" (1.676 m), weight 68.9 kg (152 lb), SpO2 97 %.Body mass index is 24.53 kg/m.  General Appearance: Guarded  Eye Contact:  Good  Speech:  Clear and Coherent and Slow  Volume:   Decreased  Mood:  Anxious and Depressed  Affect:  Constricted and Depressed  Thought Process:  Coherent and Goal Directed  Orientation:  Full (Time, Place, and Person)  Thought Content:  Rumination  Suicidal Thoughts:  No  Homicidal Thoughts:  No  Memory:  Immediate;   Fair Recent;   Fair  Judgement:  Fair  Insight:  Fair  Psychomotor Activity:  Decreased  Concentration:  Concentration: Fair and Attention Span: Fair  Recall:  [employer] of Knowledge:  Fair  Language:  Good  Akathisia:  Negative  Handed:  Right  AIMS (if indicated):     Assets:  Communication Skills Desire for Improvement Financial Resources/Insurance Housing Leisure Time Resilience Social Support Transportation  ADL's:  Impaired  Cognition:  Impaired,  Mild  Sleep:        Treatment Plan Summary: Daily contact with patient to assess and evaluate symptoms and progress in treatment and Medication management  We provide Cymbalta 20 mg daily and reduce her Zoloft to 50 mg daily for better improvement of her mood and depression Monitor for the adverse effect of the medications Appreciate psychiatric consultation and follow up as clinically required Please contact [phone] or [phone] if needs further assistance Case discussed with the LCSW patient does not meet criteria for acute psychiatric hospitalization.  Disposition: Patient does not meet criteria for psychiatric inpatient admission. Supportive therapy provided about ongoing stressors.  Ambrose Finland, MD 12/24/2015 3:34 PM

## 2015-12-24 NOTE — Progress Notes (Signed)
PROGRESS NOTE    Kathy Peterson  GOT:[id number] DOB: [birth year] DOA: 12/23/2015 PCP: No primary care provider on file.   Brief Narrative:  Kathy Peterson is an 82 year old female who is currently resident at an assisted living facility, having history of A. fib, diabetes mellitus, chronic obstructive pulmonary disease, admitted to the medicine service on 12/23/2015 presenting with failure to thrive having multiple falls at her facility. She workup included a CT scan of lungs that revealed fractures in the left seventh and eighth ribs without evidence for pneumothorax. During my evaluation she is tearful, refusing to eat, reports having depressive symptoms, stating she would be "better off going home to the [place]."   Assessment & Plan:   Principal Problem:   Rib fractures Active Problems:   Atrial fibrillation (HCC)   CHF (congestive heart failure) (HCC)   COPD (chronic obstructive pulmonary disease) (HCC)   Diabetes mellitus, type 2 (HCC)   Essential (primary) hypertension   Hypothyroidism   Falls   Hyponatremia  1.  Failure to thrive -Kathy Peterson is an 82 year old currently residing at an assisted living facility, having recurrent falls at home, determining falls to dizziness.  -On my evaluation she was tearful reported depressive symptoms. Failure to thrive could be secondary to depression. Other possibilities include dehydration, infection, or metabolic disturbances. -Providing IV fluids, will check urinalysis as well as orthostatics -Physical therapy consulted, may need rehabilitation at skilled nursing facility  2.  Major depression. -Kathy Peterson having a history of depression and had been on sertraline 100 mg by mouth daily -During my encounter she is tearful, reporting depressive symptoms, that she would be "better off going to the [facility]" -She is refusing to eat, which I think could've contributed to her dehydration -Have consulted psychiatry for assistance  3.  Hypokalemia -Lab work  showing potassium of 3.1, replaced with oral potassium  4.  Recurrent falls with rib fracture -Kathy Peterson reporting pain vertically with deep inspiration, likely contributing to some of her breathing symptoms -Will schedule Tylenol 1000 mg 3 times a day, provide 5 mg of oxycodone every 6 hours for severe breakthrough pain -Provide incentive spirometry  5.  Dizziness -She reported having dizziness which led to her falling. -Unclear if this is related to orthostatic hypotension, checking orthostatics. Also check a urinalysis.   6.  Shortness of breath -Unclear etiology, had a CT scan of lungs with IV contrast on admission that did not show pulmonary emboli. This study did not reveal interstitial edema or pneumonia. -Lungs are clear on exam -I think thoracic wall pain could be contributing  7.  Hypertension -Continue Cardizem 120 mg by mouth daily and Cozaar 25 mg by mouth daily  DVT prophylaxis:  Code Status: Full code Family Communication: Family not available Disposition Plan: May require skilled nursing facility placement  Consultants:   Physical therapy  Procedures:    Antimicrobials:    Subjective: Kathy Peterson is tearful, states not feeling like talking, refusing breakfast  Objective: Vitals:   12/23/15 2058 12/23/15 2132 12/24/15 0514 12/24/15 0948  BP: (!) 143/83  (!) 153/66   Pulse: 95  87   Resp:   20   Temp: 98.5 F (36.9 C)  99.4 F (37.4 C)   TempSrc: Oral  Oral   SpO2: 96% 95% 99% 95%  Weight: 68.9 kg (152 lb)     Height: 5\' 6"  (1.676 m)       Intake/Output Summary (Last 24 hours) at 12/24/15 1046 Last data filed at 12/24/15 0900  Gross per  24 hour  Intake                0 ml  Output                0 ml  Net                0 ml   Filed Weights   12/23/15 2058  Weight: 68.9 kg (152 lb)    Examination:  General exam: Tearful elderly female, minimally verbal, flat affect  Respiratory system: Having difficulties taking deep inspirations due to  pain Cardiovascular system: S1 & S2 heard, RRR. No JVD, murmurs, rubs, gallops or clicks. No pedal edema. Gastrointestinal system: Abdomen is nondistended, soft and nontender. No organomegaly or masses felt. Normal bowel sounds heard. Central nervous system: Alert and oriented. No focal neurological deficits. Extremities: Symmetric 5 x 5 power. Skin: No rashes, lesions or ulcers Psychiatry: Judgement and insight appear normal. Mood & affect appropriate.     Data Reviewed: I have personally reviewed following labs and imaging studies  CBC:  Recent Labs Lab 12/23/15 1710 12/24/15 0428  WBC 7.5 7.5  NEUTROABS 5.6  --   HGB 13.5 12.4  HCT 39.8 35.7*  MCV 79.9 79.7  PLT 245 236   Basic Metabolic Panel:  Recent Labs Lab 12/23/15 1710 12/24/15 0428  NA 132* 132*  K 3.7 3.1*  CL 96* 96*  CO2 26 26  GLUCOSE 126* 94  BUN 14 17  CREATININE 0.94 0.83  CALCIUM 9.3 8.9   GFR: Estimated Creatinine Clearance: 44.7 mL/min (by C-G formula based on SCr of 0.83 mg/dL). Liver Function Tests:  Recent Labs Lab 12/23/15 1710  AST 17  ALT 13*  ALKPHOS 79  BILITOT 0.7  PROT 6.7  ALBUMIN 4.1   No results for input(s): LIPASE, AMYLASE in the last 168 hours. No results for input(s): AMMONIA in the last 168 hours. Coagulation Profile:  Recent Labs Lab 12/23/15 1700  INR 1.04   Cardiac Enzymes: No results for input(s): CKTOTAL, CKMB, CKMBINDEX, TROPONINI in the last 168 hours. BNP (last 3 results) No results for input(s): PROBNP in the last 8760 hours. HbA1C: No results for input(s): HGBA1C in the last 72 hours. CBG:  Recent Labs Lab 12/23/15 1620 12/23/15 2310 12/24/15 0713  GLUCAP 137* 116* 98   Lipid Profile: No results for input(s): CHOL, HDL, LDLCALC, TRIG, CHOLHDL, LDLDIRECT in the last 72 hours. Thyroid Function Tests:  Recent Labs  12/24/15 0652  TSH 1.517  FREET4 1.22*   Anemia Panel: No results for input(s): VITAMINB12, FOLATE, FERRITIN, TIBC, IRON,  RETICCTPCT in the last 72 hours. Sepsis Labs: No results for input(s): PROCALCITON, LATICACIDVEN in the last 168 hours.  Recent Results (from the past 240 hour(s))  MRSA PCR Screening     Status: None   Collection Time: 12/23/15  9:14 PM  Result Value Ref Range Status   MRSA by PCR NEGATIVE NEGATIVE Final    Comment:        The GeneXpert MRSA Assay (FDA approved for NASAL specimens only), is one component of a comprehensive MRSA colonization surveillance program. It is not intended to diagnose MRSA infection nor to guide or monitor treatment for MRSA infections.          Radiology Studies: Ct Head Wo Contrast  Result Date: 12/23/2015 CLINICAL DATA:  Un witnessed fall with dizziness and neck pain, initial encounter EXAM: CT HEAD WITHOUT CONTRAST CT CERVICAL SPINE WITHOUT CONTRAST TECHNIQUE: Multidetector CT imaging of the  head and cervical spine was performed following the standard protocol without intravenous contrast. Multiplanar CT image reconstructions of the cervical spine were also generated. COMPARISON:  None. FINDINGS: CT HEAD FINDINGS The bony calvarium is intact. Mild mucosal changes are noted in the left maxillary antrum. Diffuse atrophic changes are seen. No findings to suggest acute hemorrhage, acute infarction or space-occupying mass lesion are noted. CT CERVICAL SPINE FINDINGS Seven cervical segments are well visualized. Vertebral body height is well maintained. Disc space narrowing is noted at C5-6 and C6-7 with associated osteophytic changes. Multilevel facet hypertrophic changes are seen. No acute fracture or acute facet abnormality is noted. The visualized lung apices demonstrate mild scarring bilaterally. A small left pleural effusion is seen. IMPRESSION: CT of the head: Chronic atrophic changes without acute abnormality. CT of the cervical spine: Multilevel degenerative changes without acute bony abnormality. Small left pleural effusion is seen. Electronically Signed    By: Alcide Clever M.D.   On: 12/23/2015 19:02   Ct Angio Chest Pe W And/or Wo Contrast  Result Date: 12/23/2015 CLINICAL DATA:  Recent fall with chest pain and shortness of Breath EXAM: CT ANGIOGRAPHY CHEST WITH CONTRAST TECHNIQUE: Multidetector CT imaging of the chest was performed using the standard protocol during bolus administration of intravenous contrast. Multiplanar CT image reconstructions and MIPs were obtained to evaluate the vascular anatomy. CONTRAST:  80 mL Isovue 370. COMPARISON:  None. FINDINGS: Mediastinum/Lymph Nodes: No significant hilar or mediastinal adenopathy is identified. The thoracic inlet is within normal limits. Cardiovascular: Diffuse aortic calcifications are seen without aneurysmal dilatation or dissection. The pulmonary artery shows a normal branching pattern without intraluminal filling defect to suggest pulmonary embolism. Coronary calcifications are noted. Mitral valve replacement is seen Lungs/Pleura: Mild left lower lobe atelectasis is noted with associated small effusion. No pneumothorax is seen. Upper abdomen: No acute abnormality noted. Musculoskeletal: Mildly displaced fractures of the left seventh and eighth ribs Review of the MIP images confirms the above findings. IMPRESSION: No evidence of pulmonary emboli. Fractures of the left seventh and eighth ribs without pneumothorax. Associated left lower lobe atelectasis and effusion is noted. Electronically Signed   By: Alcide Clever M.D.   On: 12/23/2015 19:10   Ct Cervical Spine Wo Contrast  Result Date: 12/23/2015 CLINICAL DATA:  Un witnessed fall with dizziness and neck pain, initial encounter EXAM: CT HEAD WITHOUT CONTRAST CT CERVICAL SPINE WITHOUT CONTRAST TECHNIQUE: Multidetector CT imaging of the head and cervical spine was performed following the standard protocol without intravenous contrast. Multiplanar CT image reconstructions of the cervical spine were also generated. COMPARISON:  None. FINDINGS: CT HEAD FINDINGS  The bony calvarium is intact. Mild mucosal changes are noted in the left maxillary antrum. Diffuse atrophic changes are seen. No findings to suggest acute hemorrhage, acute infarction or space-occupying mass lesion are noted. CT CERVICAL SPINE FINDINGS Seven cervical segments are well visualized. Vertebral body height is well maintained. Disc space narrowing is noted at C5-6 and C6-7 with associated osteophytic changes. Multilevel facet hypertrophic changes are seen. No acute fracture or acute facet abnormality is noted. The visualized lung apices demonstrate mild scarring bilaterally. A small left pleural effusion is seen. IMPRESSION: CT of the head: Chronic atrophic changes without acute abnormality. CT of the cervical spine: Multilevel degenerative changes without acute bony abnormality. Small left pleural effusion is seen. Electronically Signed   By: Alcide Clever M.D.   On: 12/23/2015 19:02        Scheduled Meds: . acetaminophen  1,000 mg Oral Q8H  .  diltiazem  120 mg Oral QHS  . docusate sodium  100 mg Oral BID  . enoxaparin (LOVENOX) injection  40 mg Subcutaneous QHS  . furosemide  10 mg Oral Daily  . insulin aspart  0-15 Units Subcutaneous TID WC  . levothyroxine  100 mcg Oral QAC breakfast  . loratadine  10 mg Oral Daily  . losartan  25 mg Oral Daily  . mometasone-formoterol  2 puff Inhalation BID  . MUSCLE RUB  1 application Topical TID  . pantoprazole  40 mg Oral Daily  . polyvinyl alcohol  1 drop Both Eyes Q12H  . psyllium  1 packet Oral QHS  . sertraline  100 mg Oral Daily  . sodium chloride  500 mL Intravenous Once  . sodium chloride flush  3 mL Intravenous Q12H   Continuous Infusions: . sodium chloride       LOS: 0 days    Time spent: 35 minutes    Jeralyn Bennett, MD [facility] Pager [phone]  If 7PM-7AM, please contact night-coverage [email or web address] Password [email or web address] 12/24/2015, 10:46 AM

## 2015-12-25 DIAGNOSIS — R627 Adult failure to thrive: Secondary | ICD-10-CM | POA: Diagnosis present

## 2015-12-25 DIAGNOSIS — F339 Major depressive disorder, recurrent, unspecified: Secondary | ICD-10-CM | POA: Diagnosis present

## 2015-12-25 LAB — CBC
HCT: 35.2 % — ABNORMAL LOW (ref 36.0–46.0)
HEMOGLOBIN: 11.9 g/dL — AB (ref 12.0–15.0)
MCH: 26.9 pg (ref 26.0–34.0)
MCHC: 33.8 g/dL (ref 30.0–36.0)
MCV: 79.6 fL (ref 78.0–100.0)
Platelets: 227 10*3/uL (ref 150–400)
RBC: 4.42 MIL/uL (ref 3.87–5.11)
RDW: 13.7 % (ref 11.5–15.5)
WBC: 6.2 10*3/uL (ref 4.0–10.5)

## 2015-12-25 LAB — HEMOGLOBIN A1C
HEMOGLOBIN A1C: 6 % — AB (ref 4.8–5.6)
MEAN PLASMA GLUCOSE: 126 mg/dL

## 2015-12-25 LAB — BASIC METABOLIC PANEL
Anion gap: 7 (ref 5–15)
BUN: 15 mg/dL (ref 6–20)
CHLORIDE: 102 mmol/L (ref 101–111)
CO2: 26 mmol/L (ref 22–32)
CREATININE: 0.81 mg/dL (ref 0.44–1.00)
Calcium: 8.6 mg/dL — ABNORMAL LOW (ref 8.9–10.3)
GFR calc Af Amer: >60 mL/min (ref >60)
GLUCOSE: 107 mg/dL — AB (ref 65–99)
Potassium: 3.5 mmol/L (ref 3.5–5.1)
SODIUM: 135 mmol/L (ref 135–145)

## 2015-12-25 LAB — GLUCOSE, CAPILLARY
GLUCOSE-CAPILLARY: 107 mg/dL — AB (ref 65–99)
GLUCOSE-CAPILLARY: 143 mg/dL — AB (ref 65–99)
GLUCOSE-CAPILLARY: 126 mg/dL — AB (ref 65–99)

## 2015-12-25 MED ORDER — DULOXETINE HCL 30 MG PO CPEP
30.0000 mg | ORAL_CAPSULE | Freq: Every day | ORAL | Status: DC
  Administered 2015-12-26 – 2015-12-27 (×2): 30 mg via ORAL
  Filled 2015-12-25 (×2): qty 1

## 2015-12-25 MED ORDER — TRAZODONE HCL 50 MG PO TABS: 50.0000 mg | ORAL_TABLET | Freq: Every evening | ORAL | Status: DC | PRN

## 2015-12-25 NOTE — Progress Notes (Signed)
PROGRESS NOTE    Kathy Peterson  BWL:[id number] DOB: [birth year] DOA: 12/23/2015 PCP: No primary care provider on file.   Brief Narrative:  Kathy Peterson is an 82 year old female who is currently resident at an assisted living facility, having history of A. fib, diabetes mellitus, chronic obstructive pulmonary disease, admitted to the medicine service on 12/23/2015 presenting with failure to thrive having multiple falls at her facility. She workup included a CT scan of lungs that revealed fractures in the left seventh and eighth ribs without evidence for pneumothorax. During my evaluation she is tearful, refusing to eat, reports having depressive symptoms, stating she would be "better off going home to the [facility] Psychiatry was consulted, patient evaluated by Dr Elsie Saas recommending starting Cymbalta 20 mg by mouth daily with reduction of Zoloft at 50 mg by mouth daily.  During this hospitalization she was also evaluated by physical therapy recommended rehabilitation at skilled nursing facility. She came from an assisted living facility. Social work consulted for placement.   Assessment & Plan:   Principal Problem:   Rib fractures Active Problems:   Atrial fibrillation (HCC)   CHF (congestive heart failure) (HCC)   COPD (chronic obstructive pulmonary disease) (HCC)   Diabetes mellitus, type 2 (HCC)   Essential (primary) hypertension   Hypothyroidism   Falls   Hyponatremia   Rib fracture  1.  Failure to thrive -Kathy Peterson is an 82 year old currently residing at an assisted living facility, having recurrent falls at home, determining falls to dizziness.  -On my evaluation she was tearful reported depressive symptoms. Failure to thrive could be secondary to depression. Other possibilities include dehydration, infection, or metabolic disturbances. -Urinalysis is pending -She remains afebrile, hemodynamically stable, work showing normal white count -Physical therapy consulted who recommended  skilled nursing facility placement   2.  Major depression. -Patient having a history of depression and had been on sertraline 100 mg by mouth daily -During my encounter on 12/24/2015 she is tearful, reporting depressive symptoms, that she would be "better off going to the [facility]" -Psychiatry was consulted, she was evaluated by Dr Elsie Saas who recommended starting Cymbalta 20 mg by mouth daily with reduction of Zoloft at 50 mg by mouth daily.  3.  Hypokalemia -Lab work showing potassium of 3.1, replaced with oral potassium -Repeat labs showing potassium of 3.5  4.  Recurrent falls with rib fracture -Patient reporting pain vertically with deep inspiration, likely contributing to some of her breathing symptoms -Will schedule Tylenol 1000 mg 3 times a day, provide 5 mg of oxycodone every 6 hours for severe breakthrough pain -Provide incentive spirometry -Physical therapy recommending skilled nursing facility placement  5.  Dizziness -She reported having dizziness which led to her falling. -Unclear if this is related to orthostatic hypotension, checking orthostatics. Also check a urinalysis.   6.  Shortness of breath -Unclear etiology, had a CT scan of lungs with IV contrast on admission that did not show pulmonary emboli. This study did not reveal interstitial edema or pneumonia. -Lungs are clear on exam, O2 sats remain in the mid to upper 90s on room air -I think thoracic wall pain could be contributing  7.  Hypertension -Continue Cardizem 120 mg by mouth daily and Cozaar 25 mg by mouth daily  DVT prophylaxis:  Code Status: Full code Family Communication: I spoke to her daughter-in-law on 12/25/2015 over telephone conversation, we discussed SNF placement Disposition Plan: Social work consulted for placement to skilled nursing facility  Consultants:   Physical therapy  Subjective:  Patient is sitting up in her bed, having her breakfast, flat affect, minimally  engaged  Objective: Vitals:   12/24/15 2100 12/25/15 0623 12/25/15 0744 12/25/15 1248  BP: (!) 160/79 (!) 157/72  (!) 153/94  Pulse: 86 85  (!) 52  Resp: 20 20  20   Temp: 98.9 F (37.2 C) 98.7 F (37.1 C)  98.2 F (36.8 C)  TempSrc: Oral Oral  Oral  SpO2: 99% 99% 96% 97%  Weight:      Height:        Intake/Output Summary (Last 24 hours) at 12/25/15 1440 Last data filed at 12/25/15 0700  Gross per 24 hour  Intake           2007.5 ml  Output                0 ml  Net           2007.5 ml   Filed Weights   12/23/15 2058  Weight: 68.9 kg (152 lb)    Examination:  General exam: She is awake and alert, minimally verbal, flat affect Respiratory system: Having difficulties taking deep inspirations due to pain Cardiovascular system: S1 & S2 heard, RRR. No JVD, murmurs, rubs, gallops or clicks. No pedal edema. Gastrointestinal system: Abdomen is nondistended, soft and nontender. No organomegaly or masses felt. Normal bowel sounds heard. Central nervous system: Alert and oriented. No focal neurological deficits. Extremities: Symmetric 5 x 5 power. Skin: No rashes, lesions or ulcers Psychiatry: Flat affect    Data Reviewed: I have personally reviewed following labs and imaging studies  CBC:  Recent Labs Lab 12/23/15 1710 12/24/15 0428 12/25/15 0441  WBC 7.5 7.5 6.2  NEUTROABS 5.6  --   --   HGB 13.5 12.4 11.9*  HCT 39.8 35.7* 35.2*  MCV 79.9 79.7 79.6  PLT 245 236 227   Basic Metabolic Panel:  Recent Labs Lab 12/23/15 1710 12/24/15 0428 12/25/15 0441  NA 132* 132* 135  K 3.7 3.1* 3.5  CL 96* 96* 102  CO2 26 26 26   GLUCOSE 126* 94 107*  BUN 14 17 15   CREATININE 0.94 0.83 0.81  CALCIUM 9.3 8.9 8.6*   GFR: Estimated Creatinine Clearance: 45.8 mL/min (by C-G formula based on SCr of 0.81 mg/dL). Liver Function Tests:  Recent Labs Lab 12/23/15 1710  AST 17  ALT 13*  ALKPHOS 79  BILITOT 0.7  PROT 6.7  ALBUMIN 4.1   No results for input(s): LIPASE,  AMYLASE in the last 168 hours. No results for input(s): AMMONIA in the last 168 hours. Coagulation Profile:  Recent Labs Lab 12/23/15 1700  INR 1.04   Cardiac Enzymes: No results for input(s): CKTOTAL, CKMB, CKMBINDEX, TROPONINI in the last 168 hours. BNP (last 3 results) No results for input(s): PROBNP in the last 8760 hours. HbA1C:  Recent Labs  12/24/15 0428  HGBA1C 6.0*   CBG:  Recent Labs Lab 12/24/15 1143 12/24/15 1651 12/24/15 2126 12/25/15 0743 12/25/15 1137  GLUCAP 104* 118* 127* 107* 143*   Lipid Profile: No results for input(s): CHOL, HDL, LDLCALC, TRIG, CHOLHDL, LDLDIRECT in the last 72 hours. Thyroid Function Tests:  Recent Labs  12/24/15 0652  TSH 1.517  FREET4 1.22*   Anemia Panel: No results for input(s): VITAMINB12, FOLATE, FERRITIN, TIBC, IRON, RETICCTPCT in the last 72 hours. Sepsis Labs: No results for input(s): PROCALCITON, LATICACIDVEN in the last 168 hours.  Recent Results (from the past 240 hour(s))  MRSA PCR Screening     Status: None  Collection Time: 12/23/15  9:14 PM  Result Value Ref Range Status   MRSA by PCR NEGATIVE NEGATIVE Final    Comment:        The GeneXpert MRSA Assay (FDA approved for NASAL specimens only), is one component of a comprehensive MRSA colonization surveillance program. It is not intended to diagnose MRSA infection nor to guide or monitor treatment for MRSA infections.          Radiology Studies: Ct Head Wo Contrast  Result Date: 12/23/2015 CLINICAL DATA:  Un witnessed fall with dizziness and neck pain, initial encounter EXAM: CT HEAD WITHOUT CONTRAST CT CERVICAL SPINE WITHOUT CONTRAST TECHNIQUE: Multidetector CT imaging of the head and cervical spine was performed following the standard protocol without intravenous contrast. Multiplanar CT image reconstructions of the cervical spine were also generated. COMPARISON:  None. FINDINGS: CT HEAD FINDINGS The bony calvarium is intact. Mild mucosal  changes are noted in the left maxillary antrum. Diffuse atrophic changes are seen. No findings to suggest acute hemorrhage, acute infarction or space-occupying mass lesion are noted. CT CERVICAL SPINE FINDINGS Seven cervical segments are well visualized. Vertebral body height is well maintained. Disc space narrowing is noted at C5-6 and C6-7 with associated osteophytic changes. Multilevel facet hypertrophic changes are seen. No acute fracture or acute facet abnormality is noted. The visualized lung apices demonstrate mild scarring bilaterally. A small left pleural effusion is seen. IMPRESSION: CT of the head: Chronic atrophic changes without acute abnormality. CT of the cervical spine: Multilevel degenerative changes without acute bony abnormality. Small left pleural effusion is seen. Electronically Signed   By: Alcide Clever M.D.   On: 12/23/2015 19:02   Ct Angio Chest Pe W And/or Wo Contrast  Result Date: 12/23/2015 CLINICAL DATA:  Recent fall with chest pain and shortness of Breath EXAM: CT ANGIOGRAPHY CHEST WITH CONTRAST TECHNIQUE: Multidetector CT imaging of the chest was performed using the standard protocol during bolus administration of intravenous contrast. Multiplanar CT image reconstructions and MIPs were obtained to evaluate the vascular anatomy. CONTRAST:  80 mL Isovue 370. COMPARISON:  None. FINDINGS: Mediastinum/Lymph Nodes: No significant hilar or mediastinal adenopathy is identified. The thoracic inlet is within normal limits. Cardiovascular: Diffuse aortic calcifications are seen without aneurysmal dilatation or dissection. The pulmonary artery shows a normal branching pattern without intraluminal filling defect to suggest pulmonary embolism. Coronary calcifications are noted. Mitral valve replacement is seen Lungs/Pleura: Mild left lower lobe atelectasis is noted with associated small effusion. No pneumothorax is seen. Upper abdomen: No acute abnormality noted. Musculoskeletal: Mildly displaced  fractures of the left seventh and eighth ribs Review of the MIP images confirms the above findings. IMPRESSION: No evidence of pulmonary emboli. Fractures of the left seventh and eighth ribs without pneumothorax. Associated left lower lobe atelectasis and effusion is noted. Electronically Signed   By: Alcide Clever M.D.   On: 12/23/2015 19:10   Ct Cervical Spine Wo Contrast  Result Date: 12/23/2015 CLINICAL DATA:  Un witnessed fall with dizziness and neck pain, initial encounter EXAM: CT HEAD WITHOUT CONTRAST CT CERVICAL SPINE WITHOUT CONTRAST TECHNIQUE: Multidetector CT imaging of the head and cervical spine was performed following the standard protocol without intravenous contrast. Multiplanar CT image reconstructions of the cervical spine were also generated. COMPARISON:  None. FINDINGS: CT HEAD FINDINGS The bony calvarium is intact. Mild mucosal changes are noted in the left maxillary antrum. Diffuse atrophic changes are seen. No findings to suggest acute hemorrhage, acute infarction or space-occupying mass lesion are noted. CT CERVICAL  SPINE FINDINGS Seven cervical segments are well visualized. Vertebral body height is well maintained. Disc space narrowing is noted at C5-6 and C6-7 with associated osteophytic changes. Multilevel facet hypertrophic changes are seen. No acute fracture or acute facet abnormality is noted. The visualized lung apices demonstrate mild scarring bilaterally. A small left pleural effusion is seen. IMPRESSION: CT of the head: Chronic atrophic changes without acute abnormality. CT of the cervical spine: Multilevel degenerative changes without acute bony abnormality. Small left pleural effusion is seen. Electronically Signed   By: Alcide Clever M.D.   On: 12/23/2015 19:02        Scheduled Meds: . acetaminophen  1,000 mg Oral Q8H  . diltiazem  120 mg Oral QHS  . docusate sodium  100 mg Oral BID  . DULoxetine  20 mg Oral Daily  . enoxaparin (LOVENOX) injection  40 mg  Subcutaneous QHS  . furosemide  10 mg Oral Daily  . insulin aspart  0-15 Units Subcutaneous TID WC  . levothyroxine  100 mcg Oral QAC breakfast  . loratadine  10 mg Oral Daily  . losartan  25 mg Oral Daily  . mometasone-formoterol  2 puff Inhalation BID  . MUSCLE RUB  1 application Topical TID  . pantoprazole  40 mg Oral Daily  . polyvinyl alcohol  1 drop Both Eyes Q12H  . psyllium  1 packet Oral QHS  . sertraline  50 mg Oral Daily  . sodium chloride flush  3 mL Intravenous Q12H   Continuous Infusions:     LOS: 1 day    Time spent: 25 minutes    Jeralyn Bennett, MD [facility] Pager [phone]  If 7PM-7AM, please contact night-coverage [email or web address] Password [email or web address] 12/25/2015, 2:40 PM

## 2015-12-25 NOTE — Consult Note (Signed)
[facility] Consult   Reason for Consult:  Chronic depression Referring Physician:  Dr. Coralyn Pear Patient Identification: Kathy Peterson MRN:  [id number] Principal Diagnosis: Rib fractures Diagnosis:   Patient Active Problem List   Diagnosis Date Noted  . Hyponatremia [E87.1] 12/24/2015  . Rib fracture [S22.39XA] 12/24/2015  . Rib fractures [S22.39XA] 12/23/2015  . Atrial fibrillation ([place]) [I48.91] 12/23/2015  . CHF (congestive heart failure) ([place]) [I50.9] 12/23/2015  . COPD (chronic obstructive pulmonary disease) ([place]) [J44.9] 12/23/2015  . Diabetes mellitus, type 2 ([place]) [E11.9] 12/23/2015  . Essential (primary) hypertension [I10] 12/23/2015  . Hypothyroidism [E03.9] 12/23/2015  . Falls [phone] 12/23/2015    Total Time spent with patient: 45 minutes  Subjective:   Kathy Peterson is a 82 y.o. female patient admitted with depression  HPI:   Kathy Peterson is a 82 y.o. female with medical history significant of afib, DM,CHF (NOS), COPD, HTN presenting with falls. Received psychiatric consultation for medication management of depression. Patient mental face-to-face for this evaluation and information for this evaluation obtained from face-to-face evaluation with the patient and patient's son who is at bedside. Patient is a poor historian and patient son reported patient has been under significant stress from her husband who has been very angry man. Patient was relocated to one of her son but they could not take care of her which resulted placed in a a single living facility called [facility] about few months ago. Patient reportedly has increased symptoms of depression, dysphoria, disturbed sleep and appetite, isolation and somewhat withdrawn. Patient denies active suicidal or homicidal ideation, intention or plans. Patient has no evidence of psychosis. Patient endorses dizziness and recent fall and it is not very clear it is secondary to dehydration or cardiac problems like atrial  fibrillation. Patient son stated that patient has not responded to her current medication and willing to try different medication.   Past Psychiatric History: Patient has been suffering with chronic depression and has been deceived medication management from [facility] where she has been staying for a few months. Interval history:   Interval history: Patient seen today with the LCSW for psychiatric consultation follow-up. Patient complaint "I'm not feeling good" and states she wants to go home. Patient is also seems to be depressed and anxious and asking for her son who is not in the hospital today. Patient is tolerating her medication changes and has no reported side effects. Patient is also poor historian could not recall any specific difficulties. Patient has hypothyroidism and atrial fibrillation which resulted frequent dizziness and falls. Patient denies active suicidal, homicidal ideation, intention or plans and evidence of auditory or/visual hallucinations, delusions or paranoia. We will adjust her medication Cymbalta to the higher dose as she tolerates and clinically responds.  Risk to Self: Is patient at risk for suicide?: No Risk to Others:   Prior Inpatient Therapy:   Prior Outpatient Therapy:    Past Medical History:  Past Medical History:  Diagnosis Date  . Arthritis    rheumatoid  . Atrial fibrillation ([place])   . CHF (congestive heart failure) ([place])   . COPD (chronic obstructive pulmonary disease) ([place])   . Depression   . Diabetes mellitus without complication ([place])    type 2  . GERD (gastroesophageal reflux disease)   . Hypertension   . Pacemaker   . Pneumonia   . Polio   . Shortness of breath   . Thyroid disease    hypothyroid    Past Surgical History:  Procedure Laterality Date  . ABDOMINAL HYSTERECTOMY    . CHOLECYSTECTOMY    . EYE SURGERY     Family History:  Family History  Problem Relation Age of Onset  . Family history  unknown: Yes   Family Psychiatric  History: Not significant for mental illness.  Social History:  History  Alcohol Use No     History  Drug Use No    Social History   Social History  . Marital status: Widowed    Spouse name: N/A  . Number of children: N/A  . Years of education: N/A   Occupational History  . retired    Social History Main Topics  . Smoking status: Never Smoker  . Smokeless tobacco: Never Used  . Alcohol use No  . Drug use: No  . Sexual activity: No   Other Topics Concern  . None   Social History Narrative  . None   Additional Social History:    Allergies:   Allergies  Allergen Reactions  . Penicillins Other (See Comments)    Reaction:  Unknown  Has patient had a PCN reaction causing immediate rash, facial/tongue/throat swelling, SOB or lightheadedness with hypotension: Unsure Has patient had a PCN reaction causing severe rash involving mucus membranes or skin necrosis: Unsure Has patient had a PCN reaction that required hospitalization Unsure Has patient had a PCN reaction occurring within the last 10 years: Unsure If all of the above answers are "NO", then may proceed with Cephalosporin use.  . Pneumovax [Pneumococcal Polysaccharide Vaccine] Other (See Comments)    Reaction:  Unknown   . Statins Other (See Comments)    Reaction:  Unknown   . Sulfa Antibiotics Other (See Comments)    Reaction:  Unknown     Labs:  Results for orders placed or performed during the hospital encounter of 12/23/15 (from the past 48 hour(s))  CBG monitoring, ED     Status: Abnormal   Collection Time: 12/23/15  4:20 PM  Result Value Ref Range   Glucose-Capillary 137 (H) 65 - 99 mg/dL   Comment 1 Notify RN    Comment 2 Document in Chart   Protime-INR     Status: None   Collection Time: 12/23/15  5:00 PM  Result Value Ref Range   Prothrombin Time 13.6 11.4 - 15.2 seconds   INR 1.04   CBC with Differential     Status: None   Collection Time: 12/23/15  5:10  PM  Result Value Ref Range   WBC 7.5 4.0 - 10.5 K/uL   RBC 4.98 3.87 - 5.11 MIL/uL   Hemoglobin 13.5 12.0 - 15.0 g/dL   HCT 39.8 36.0 - 46.0 %   MCV 79.9 78.0 - 100.0 fL   MCH 27.1 26.0 - 34.0 pg   MCHC 33.9 30.0 - 36.0 g/dL   RDW 13.6 11.5 - 15.5 %   Platelets 245 150 - 400 K/uL   Neutrophils Relative % 75 %   Neutro Abs 5.6 1.7 - 7.7 K/uL   Lymphocytes Relative 12 %   Lymphs Abs 0.9 0.7 - 4.0 K/uL   Monocytes Relative 12 %   Monocytes Absolute 0.9 0.1 - 1.0 K/uL   Eosinophils Relative 1 %   Eosinophils Absolute 0.1 0.0 - 0.7 K/uL   Basophils Relative 0 %   Basophils Absolute 0.0 0.0 - 0.1 K/uL  Comprehensive metabolic panel     Status: Abnormal   Collection Time: 12/23/15  5:10 PM  Result Value Ref Range  Sodium 132 (L) 135 - 145 mmol/L   Potassium 3.7 3.5 - 5.1 mmol/L   Chloride 96 (L) 101 - 111 mmol/L   CO2 26 22 - 32 mmol/L   Glucose, Bld 126 (H) 65 - 99 mg/dL   BUN 14 6 - 20 mg/dL   Creatinine, Ser 0.94 0.44 - 1.00 mg/dL   Calcium 9.3 8.9 - 10.3 mg/dL   Total Protein 6.7 6.5 - 8.1 g/dL   Albumin 4.1 3.5 - 5.0 g/dL   AST 17 15 - 41 U/L   ALT 13 (L) 14 - 54 U/L   Alkaline Phosphatase 79 38 - 126 U/L   Total Bilirubin 0.7 0.3 - 1.2 mg/dL   GFR calc non Af Amer 53 (L) >60 mL/min   GFR calc Af Amer >60 >60 mL/min    Comment: (NOTE) The eGFR has been calculated using the CKD EPI equation. This calculation has not been validated in all clinical situations. eGFR's persistently <60 mL/min signify possible Chronic Kidney Disease.    Anion gap 10 5 - 15  Brain natriuretic peptide     Status: Abnormal   Collection Time: 12/23/15  5:10 PM  Result Value Ref Range   B Natriuretic Peptide 855.7 (H) 0.0 - 100.0 pg/mL  I-Stat Troponin, ED - 0, 3, 6 hours (not at [facility])     Status: None   Collection Time: 12/23/15  5:18 PM  Result Value Ref Range   Troponin i, poc 0.02 0.00 - 0.08 ng/mL   Comment 3            Comment: Due to the release kinetics of cTnI, a negative result  within the first hours of the onset of symptoms does not rule out myocardial infarction with certainty. If myocardial infarction is still suspected, repeat the test at appropriate intervals.   MRSA PCR Screening     Status: None   Collection Time: 12/23/15  9:14 PM  Result Value Ref Range   MRSA by PCR NEGATIVE NEGATIVE    Comment:        The GeneXpert MRSA Assay (FDA approved for NASAL specimens only), is one component of a comprehensive MRSA colonization surveillance program. It is not intended to diagnose MRSA infection nor to guide or monitor treatment for MRSA infections.   Glucose, capillary     Status: Abnormal   Collection Time: 12/23/15 11:10 PM  Result Value Ref Range   Glucose-Capillary 116 (H) 65 - 99 mg/dL  Hemoglobin A1c     Status: Abnormal   Collection Time: 12/24/15  4:28 AM  Result Value Ref Range   Hgb A1c MFr Bld 6.0 (H) 4.8 - 5.6 %    Comment: (NOTE)         Pre-diabetes: 5.7 - 6.4         Diabetes: >6.4         Glycemic control for adults with diabetes: <7.0    Mean Plasma Glucose 126 mg/dL    Comment: (NOTE) Performed At: [facility] [place], [place] [id number] Lindon Romp MD [id number]   Basic metabolic panel     Status: Abnormal   Collection Time: 12/24/15  4:28 AM  Result Value Ref Range   Sodium 132 (L) 135 - 145 mmol/L   Potassium 3.1 (L) 3.5 - 5.1 mmol/L   Chloride 96 (L) 101 - 111 mmol/L   CO2 26 22 - 32 mmol/L   Glucose, Bld 94 65 - 99 mg/dL   BUN 17  6 - 20 mg/dL   Creatinine, Ser 0.83 0.44 - 1.00 mg/dL   Calcium 8.9 8.9 - 10.3 mg/dL   GFR calc non Af Amer >60 >60 mL/min   GFR calc Af Amer >60 >60 mL/min    Comment: (NOTE) The eGFR has been calculated using the CKD EPI equation. This calculation has not been validated in all clinical situations. eGFR's persistently <60 mL/min signify possible Chronic Kidney Disease.    Anion gap 10 5 - 15  CBC     Status: Abnormal   Collection Time: 12/24/15   4:28 AM  Result Value Ref Range   WBC 7.5 4.0 - 10.5 K/uL   RBC 4.48 3.87 - 5.11 MIL/uL   Hemoglobin 12.4 12.0 - 15.0 g/dL   HCT 35.7 (L) 36.0 - 46.0 %   MCV 79.7 78.0 - 100.0 fL   MCH 27.7 26.0 - 34.0 pg   MCHC 34.7 30.0 - 36.0 g/dL   RDW 13.6 11.5 - 15.5 %   Platelets 236 150 - 400 K/uL  TSH     Status: None   Collection Time: 12/24/15  6:52 AM  Result Value Ref Range   TSH 1.517 0.350 - 4.500 uIU/mL  T4, free     Status: Abnormal   Collection Time: 12/24/15  6:52 AM  Result Value Ref Range   Free T4 1.22 (H) 0.61 - 1.12 ng/dL    Comment: (NOTE) Biotin ingestion may interfere with free T4 tests. If the results are inconsistent with the TSH level, previous test results, or the clinical presentation, then consider biotin interference. If needed, order repeat testing after stopping biotin. Performed at [facility]   Glucose, capillary     Status: None   Collection Time: 12/24/15  7:13 AM  Result Value Ref Range   Glucose-Capillary 98 65 - 99 mg/dL   Comment 1 Notify RN    Comment 2 Document in Chart   Glucose, capillary     Status: Abnormal   Collection Time: 12/24/15 11:43 AM  Result Value Ref Range   Glucose-Capillary 104 (H) 65 - 99 mg/dL   Comment 1 Notify RN    Comment 2 Document in Chart   Glucose, capillary     Status: Abnormal   Collection Time: 12/24/15  4:51 PM  Result Value Ref Range   Glucose-Capillary 118 (H) 65 - 99 mg/dL   Comment 1 Notify RN    Comment 2 Document in Chart   Glucose, capillary     Status: Abnormal   Collection Time: 12/24/15  9:26 PM  Result Value Ref Range   Glucose-Capillary 127 (H) 65 - 99 mg/dL  Basic metabolic panel     Status: Abnormal   Collection Time: 12/25/15  4:41 AM  Result Value Ref Range   Sodium 135 135 - 145 mmol/L   Potassium 3.5 3.5 - 5.1 mmol/L   Chloride 102 101 - 111 mmol/L   CO2 26 22 - 32 mmol/L   Glucose, Bld 107 (H) 65 - 99 mg/dL   BUN 15 6 - 20 mg/dL   Creatinine, Ser 0.81 0.44 - 1.00 mg/dL    Calcium 8.6 (L) 8.9 - 10.3 mg/dL   GFR calc non Af Amer >60 >60 mL/min   GFR calc Af Amer >60 >60 mL/min    Comment: (NOTE) The eGFR has been calculated using the CKD EPI equation. This calculation has not been validated in all clinical situations. eGFR's persistently <60 mL/min signify possible Chronic Kidney Disease.  Anion gap 7 5 - 15  CBC     Status: Abnormal   Collection Time: 12/25/15  4:41 AM  Result Value Ref Range   WBC 6.2 4.0 - 10.5 K/uL   RBC 4.42 3.87 - 5.11 MIL/uL   Hemoglobin 11.9 (L) 12.0 - 15.0 g/dL   HCT 35.2 (L) 36.0 - 46.0 %   MCV 79.6 78.0 - 100.0 fL   MCH 26.9 26.0 - 34.0 pg   MCHC 33.8 30.0 - 36.0 g/dL   RDW 13.7 11.5 - 15.5 %   Platelets 227 150 - 400 K/uL  Glucose, capillary     Status: Abnormal   Collection Time: 12/25/15  7:43 AM  Result Value Ref Range   Glucose-Capillary 107 (H) 65 - 99 mg/dL  Glucose, capillary     Status: Abnormal   Collection Time: 12/25/15 11:37 AM  Result Value Ref Range   Glucose-Capillary 143 (H) 65 - 99 mg/dL    Current Facility-Administered Medications  Medication Dose Route Frequency Provider Last Rate Last Dose  . 0.9 %  sodium chloride infusion   Intravenous Continuous Kelvin Cellar, MD 75 mL/hr at 12/25/15 0915    . acetaminophen (TYLENOL) suppository 650 mg  650 mg Rectal Q6H PRN Karmen Bongo, MD      . acetaminophen (TYLENOL) tablet 1,000 mg  1,000 mg Oral Q8H Kelvin Cellar, MD   1,000 mg at 12/25/15 1300  . albuterol (PROVENTIL) (2.5 MG/3ML) 0.083% nebulizer solution 3 mL  3 mL Inhalation Q6H PRN Karmen Bongo, MD   3 mL at 12/24/15 2031  . ciclopirox (LOPROX) 9.39 % cream 1 application  1 application Topical BID PRN Karmen Bongo, MD      . diltiazem (CARDIZEM CD) 24 hr capsule 120 mg  120 mg Oral QHS Karmen Bongo, MD   120 mg at 12/24/15 2123  . docusate sodium (COLACE) capsule 100 mg  100 mg Oral BID Karmen Bongo, MD   100 mg at 12/25/15 0914  . DULoxetine (CYMBALTA) DR capsule 20 mg  20 mg Oral  Daily Ambrose Finland, MD   20 mg at 12/25/15 0914  . enoxaparin (LOVENOX) injection 40 mg  40 mg Subcutaneous QHS Karmen Bongo, MD   40 mg at 12/24/15 2123  . furosemide (LASIX) tablet 10 mg  10 mg Oral Daily Karmen Bongo, MD   10 mg at 12/25/15 0914  . insulin aspart (novoLOG) injection 0-15 Units  0-15 Units Subcutaneous TID WC Karmen Bongo, MD   2 Units at 12/25/15 1218  . ipratropium (ATROVENT) nebulizer solution 0.5 mg  0.5 mg Nebulization Q6H PRN Karmen Bongo, MD      . ketorolac (TORADOL) 15 MG/ML injection 15 mg  15 mg Intravenous Q6H PRN Karmen Bongo, MD   15 mg at 12/24/15 0517  . levothyroxine (SYNTHROID, LEVOTHROID) tablet 100 mcg  100 mcg Oral QAC breakfast Karmen Bongo, MD   100 mcg at 12/25/15 0914  . loratadine (CLARITIN) tablet 10 mg  10 mg Oral Daily Karmen Bongo, MD   10 mg at 12/25/15 0914  . losartan (COZAAR) tablet 25 mg  25 mg Oral Daily Karmen Bongo, MD   25 mg at 12/25/15 0914  . mometasone-formoterol (DULERA) 200-5 MCG/ACT inhaler 2 puff  2 puff Inhalation BID Karmen Bongo, MD   2 puff at 12/25/15 0744  . MUSCLE RUB CREA 1 application  1 application Topical TID Karmen Bongo, MD   1 application at 03/00/92 0915  . ondansetron (ZOFRAN) tablet 4 mg  4  mg Oral Q6H PRN Karmen Bongo, MD       Or  . ondansetron [facility]) injection 4 mg  4 mg Intravenous Q6H PRN Karmen Bongo, MD      . oxyCODONE (Oxy IR/ROXICODONE) immediate release tablet 5 mg  5 mg Oral Q6H PRN Kelvin Cellar, MD      . pantoprazole (PROTONIX) EC tablet 40 mg  40 mg Oral Daily Karmen Bongo, MD   40 mg at 12/25/15 0914  . polyvinyl alcohol (LIQUIFILM TEARS) 1.4 % ophthalmic solution 1 drop  1 drop Both Eyes Q12H Karmen Bongo, MD   1 drop at 12/25/15 0915  . psyllium (HYDROCIL/METAMUCIL) packet 1 packet  1 packet Oral QHS Karmen Bongo, MD   1 packet at 12/24/15 2123  . sertraline (ZOLOFT) tablet 50 mg  50 mg Oral Daily Ambrose Finland, MD   50 mg at 12/25/15 0914  .  sodium chloride flush (NS) 0.9 % injection 3 mL  3 mL Intravenous Q12H Karmen Bongo, MD   3 mL at 12/24/15 1119    Musculoskeletal: Strength & Muscle Tone: decreased Gait & Station: unable to stand Patient leans: N/A  Psychiatric Specialty Exam: Physical Exam as per history and physical  ROS depressed, generalized weakness, fatigue, loss of interest and poor appetite and sleep.  No Fever-chills, No Headache, No changes with Vision or hearing, reports vertigo No problems swallowing food or Liquids, No Chest pain, Cough or Shortness of Breath, No Abdominal pain, No Nausea or Vommitting, Bowel movements are regular, No Blood in stool or Urine, No dysuria, No new skin rashes or bruises, No new joints pains-aches,  No new weakness, tingling, numbness in any extremity, No recent weight gain or loss, No polyuria, polydypsia or polyphagia,   A full 10 point Review of Systems was done, except as stated above, all other Review of Systems were negative.  Blood pressure (!) 153/94, pulse (!) 52, temperature 98.2 F (36.8 C), temperature source Oral, resp. rate 20, height '5\' 6"'  (1.676 m), weight 68.9 kg (152 lb), SpO2 97 %.Body mass index is 24.53 kg/m.  General Appearance: Bizarre  Eye Contact:  Good  Speech:  Clear and Coherent and Slow  Volume:  Decreased  Mood:  Anxious and Depressed "I am not feeling good'  Affect:  Constricted and Depressed  Thought Process:  Coherent and Goal Directed  Orientation:  Full (Time, Place, and Person)  Thought Content:  Rumination  Suicidal Thoughts:  No  Homicidal Thoughts:  No  Memory:  Immediate;   Fair Recent;   Fair  Judgement:  Fair  Insight:  Fair  Psychomotor Activity:  Decreased  Concentration:  Concentration: Fair and Attention Span: Fair  Recall:  [employer] of Knowledge:  Fair  Language:  Good  Akathisia:  Negative  Handed:  Right  AIMS (if indicated):     Assets:  Communication Skills Desire for Improvement Financial  Resources/Insurance Housing Leisure Time Resilience Social Support Transportation  ADL's:  Impaired  Cognition:  Impaired,  Mild  Sleep:        Treatment Plan Summary: Daily contact with patient to assess and evaluate symptoms and progress in treatment and Medication management   Depression: increase Cymbalta 20 mg daily and continue  Zoloft to 50 mg daily for mood and depression Insomnia: We provide trazodone 50 mg at bedtime for sleep difficulties  Safety concerns: Patient has no safety concerns and denies active suicidal/homicidal ideation, intention or plans Hypothyroidism: We'll continue hormone replacement therapy with Synthroid 100  g and has normal level of thyroid-stimulating hormone and mildly elevated free T4.  Patient has cognitive deficits and forgetfulness : May benefit from Aricept Monitor for the adverse effect of the medications Appreciate psychiatric consultation and follow up as clinically required Please contact [phone] or [phone] if needs further assistance Case discussed with the LCSW patient does not meet criteria for acute psychiatric hospitalization.  Disposition: Patient does not meet criteria for psychiatric inpatient admission. Supportive therapy provided about ongoing stressors.  Ambrose Finland, MD 12/25/2015 2:35 PM

## 2015-12-26 DIAGNOSIS — S2232XD Fracture of one rib, left side, subsequent encounter for fracture with routine healing: Secondary | ICD-10-CM

## 2015-12-26 DIAGNOSIS — F331 Major depressive disorder, recurrent, moderate: Secondary | ICD-10-CM

## 2015-12-26 DIAGNOSIS — R627 Adult failure to thrive: Secondary | ICD-10-CM

## 2015-12-26 LAB — GLUCOSE, CAPILLARY
Glucose-Capillary: 111 mg/dL — ABNORMAL HIGH (ref 65–99)
Glucose-Capillary: 113 mg/dL — ABNORMAL HIGH (ref 65–99)
Glucose-Capillary: 161 mg/dL — ABNORMAL HIGH (ref 65–99)
Glucose-Capillary: 125 mg/dL — ABNORMAL HIGH (ref 65–99)
Glucose-Capillary: 161 mg/dL — ABNORMAL HIGH (ref 65–99)

## 2015-12-26 NOTE — Progress Notes (Signed)
Physical Therapy Treatment Patient Details Name: Kathy Peterson MRN: [id number] DOB: Nov 15, 1926 Today's Date: 12/26/2015    History of Present Illness 82 yo female admitted with dizziness, L 7-8 rib fxs after sustaining a fall at senior living facility. Hx of A fib, DM, CHF, COPD, HTN, chronic dizziness.     PT Comments    Pt required + 2 assist to get OOB and amb.  Pt required a RW and her personal shoes with R lift.  With her HX Polio, pt demonstares R side effect and limited hip flexion.  Pt admits she uses a lift chair.  Positioned in recliner and pt requested pain meds.   Follow Up Recommendations  SNF     Equipment Recommendations  None recommended by PT    Recommendations for Other Services       Precautions / Restrictions Precautions Precautions: Fall Precaution Comments: Hx Polio R effect also has built up shoe R Restrictions Weight Bearing Restrictions: No    Mobility  Bed Mobility Overal bed mobility: Needs Assistance Bed Mobility: Supine to Sit     Supine to sit: HOB elevated;Min assist     General bed mobility comments: pt prefers to get OOB on her R side with limited use L UE due to recent rib Fx's/pain.  Pt was unable to sit EOB upright due to limited R hip flexors, so tends to extend R LE/hip and WB mostly on her L side.     Transfers Overall transfer level: Needs assistance Equipment used: Rolling walker (2 wheeled) Transfers: Sit to/from Stand Sit to Stand: Mod assist;+2 safety/equipment;+2 physical assistance         General transfer comment: required + 2 assist to rise and + 2 assist to control decend due to limited hip flexors R>L.  Pt admits, she uses a lift chair.    Ambulation/Gait Ambulation/Gait assistance: Min assist;+2 safety/equipment Ambulation Distance (Feet): 12 Feet Assistive device: Rolling walker (2 wheeled) Gait Pattern/deviations: Step-to pattern;Decreased stance time - right Gait velocity: decreased   General Gait Details:  once upright, pt was able to amb a limited distance using a walker and wearing her shoes which has a lift R.  Pt stated, she is limited due to her Polio.   Stairs            Wheelchair Mobility    Modified Rankin (Stroke Patients Only)       Balance                                    Cognition Arousal/Alertness: Awake/alert Behavior During Therapy: WFL for tasks assessed/performed Overall Cognitive Status: Within Functional Limits for tasks assessed                      Exercises      General Comments        Pertinent Vitals/Pain Faces Pain Scale: Hurts little more Pain Location: L side (rib Fx's) Pain Descriptors / Indicators: Grimacing Pain Intervention(s): Monitored during session;Repositioned    Home Living                      Prior Function            PT Goals (current goals can now be found in the care plan section) Progress towards PT goals: Progressing toward goals    Frequency  Min 3X/week    PT Plan Current plan  remains appropriate    Co-evaluation             End of Session Equipment Utilized During Treatment: Gait belt Activity Tolerance: Patient limited by pain Patient left: in chair;with call bell/phone within reach;with chair alarm set     Time: [phone] PT Time Calculation (min) (ACUTE ONLY): 25 min  Charges:  $Gait Training: 8-22 mins $Therapeutic Activity: 8-22 mins                    G Codes:      Felecia Shelling  PTA [facility]  Acute  Rehab Pager      [phone]

## 2015-12-26 NOTE — Progress Notes (Signed)
PROGRESS NOTE                                                                                                                                                                                                             Patient Demographics:    Kathy Peterson, is a 82 y.o. female, DOB - 10-12-1926, YNW:[id number]  Admit date - 12/23/2015   Admitting Physician Jonah Blue, MD  Outpatient Primary MD for the patient is No primary care provider on file.  LOS - 2  Outpatient Specialists: none  Chief Complaint  Patient presents with  . Fall    left  flank pain        Brief Narrative  82 year old female resident of assisted living facility, history of A. fib, diabetes mellitus, COPD admitted with failure to thrive with multiple falls. Patient was found to have fracture of the left seventh and eighth ribs in the ED without evidence of pneumothorax. Admitted for PT evaluation and pain management. Seen by physical therapy and recommended skilled nursing facility.   Subjective:   Complains of some pain in her left rib. Mood better today.   Assessment  & Plan :    Principal Problem:   Left Rib fractures Secondary to recurrent falls. Pain well controlled on scheduled Tylenol and when necessary oxycodone. Provided incentive spirometry at bedside. Seen by PT and recommend skilled nursing facility.  Active Problems: Dizziness Possibly due to orthostasis. Now resolved.  Major depression Was on sertraline as outpatient. Seen by psychiatry who recommended starting Cymbalta and is used dose of Zoloft. In better mood today.  Hypokalemia Replenish     Atrial fibrillation (HCC) Not on anticoagulation      COPD (chronic obstructive pulmonary disease) (HCC) Stable. Continue when necessary nebs.    Diabetes mellitus, type 2 (HCC) Stable on sliding scale coverage.    Essential (primary) hypertension Stable. Continue home  medications    Hypothyroidism Continue Synthroid. Normal TSH  Hyponatremia Secondary to dehydration. Improved with fluids.         Code Status : Full code  Family Communication  : none At bedside  Disposition Plan  : SNF possibly on 8/10   Barriers For Discharge : pending placement  Consults  :  psychiatry  Procedures  : none  DVT Prophylaxis  :  Lovenox -   Lab Results  Component Value Date   PLT 227 12/25/2015    Antibiotics  :    Anti-infectives    None        Objective:   Vitals:   12/25/15 2133 12/26/15 0527 12/26/15 1123 12/26/15 1323  BP:  136/81  (!) 118/99  Pulse:  65  73  Resp:  16  17  Temp:  98.7 F (37.1 C)  98.3 F (36.8 C)  TempSrc:  Oral  Oral  SpO2: 96% 97% 97% 99%  Weight:      Height:        Wt Readings from Last 3 Encounters:  12/23/15 68.9 kg (152 lb)     Intake/Output Summary (Last 24 hours) at 12/26/15 1704 Last data filed at 12/26/15 1330  Gross per 24 hour  Intake              480 ml  Output                0 ml  Net              480 ml     Physical Exam  Gen: not in distress HEENT: moist mucosa, supple neck Chest: clear b/l, no added sounds, Tender to palpation over left ribs CVS: N S1&S2, no murmurs,  GI: soft, NT, ND,  Musculoskeletal: warm, no edema CNS: Alert and oriented    Data Review:    CBC  Recent Labs Lab 12/23/15 1710 12/24/15 0428 12/25/15 0441  WBC 7.5 7.5 6.2  HGB 13.5 12.4 11.9*  HCT 39.8 35.7* 35.2*  PLT 245 236 227  MCV 79.9 79.7 79.6  MCH 27.1 27.7 26.9  MCHC 33.9 34.7 33.8  RDW 13.6 13.6 13.7  LYMPHSABS 0.9  --   --   MONOABS 0.9  --   --   EOSABS 0.1  --   --   BASOSABS 0.0  --   --     Chemistries   Recent Labs Lab 12/23/15 1710 12/24/15 0428 12/25/15 0441  NA 132* 132* 135  K 3.7 3.1* 3.5  CL 96* 96* 102  CO2 26 26 26   GLUCOSE 126* 94 107*  BUN 14 17 15   CREATININE 0.94 0.83 0.81  CALCIUM 9.3 8.9 8.6*  AST 17  --   --   ALT 13*  --   --   ALKPHOS 79   --   --   BILITOT 0.7  --   --    ------------------------------------------------------------------------------------------------------------------ No results for input(s): CHOL, HDL, LDLCALC, TRIG, CHOLHDL, LDLDIRECT in the last 72 hours.  Lab Results  Component Value Date   HGBA1C 6.0 (H) 12/24/2015   ------------------------------------------------------------------------------------------------------------------  Recent Labs  12/24/15 0652  TSH 1.517   ------------------------------------------------------------------------------------------------------------------ No results for input(s): VITAMINB12, FOLATE, FERRITIN, TIBC, IRON, RETICCTPCT in the last 72 hours.  Coagulation profile  Recent Labs Lab 12/23/15 1700  INR 1.04    No results for input(s): DDIMER in the last 72 hours.  Cardiac Enzymes No results for input(s): CKMB, TROPONINI, MYOGLOBIN in the last 168 hours.  Invalid input(s): CK ------------------------------------------------------------------------------------------------------------------    Component Value Date/Time   BNP 855.7 (H) 12/23/2015 1710    Inpatient Medications  Scheduled Meds: . acetaminophen  1,000 mg Oral Q8H  . diltiazem  120 mg Oral QHS  . docusate sodium  100 mg Oral BID  . DULoxetine  30 mg Oral Daily  . enoxaparin (LOVENOX) injection  40 mg Subcutaneous QHS  . furosemide  10 mg Oral Daily  .  insulin aspart  0-15 Units Subcutaneous TID WC  . levothyroxine  100 mcg Oral QAC breakfast  . loratadine  10 mg Oral Daily  . losartan  25 mg Oral Daily  . mometasone-formoterol  2 puff Inhalation BID  . MUSCLE RUB  1 application Topical TID  . pantoprazole  40 mg Oral Daily  . polyvinyl alcohol  1 drop Both Eyes Q12H  . psyllium  1 packet Oral QHS  . sertraline  50 mg Oral Daily  . sodium chloride flush  3 mL Intravenous Q12H   Continuous Infusions:  PRN Meds:.[DISCONTINUED] acetaminophen **OR** acetaminophen, albuterol,  ciclopirox, ipratropium, ketorolac, oxyCODONE, traZODone  Micro Results Recent Results (from the past 240 hour(s))  MRSA PCR Screening     Status: None   Collection Time: 12/23/15  9:14 PM  Result Value Ref Range Status   MRSA by PCR NEGATIVE NEGATIVE Final    Comment:        The GeneXpert MRSA Assay (FDA approved for NASAL specimens only), is one component of a comprehensive MRSA colonization surveillance program. It is not intended to diagnose MRSA infection nor to guide or monitor treatment for MRSA infections.     Radiology Reports Ct Head Wo Contrast  Result Date: 12/23/2015 CLINICAL DATA:  Un witnessed fall with dizziness and neck pain, initial encounter EXAM: CT HEAD WITHOUT CONTRAST CT CERVICAL SPINE WITHOUT CONTRAST TECHNIQUE: Multidetector CT imaging of the head and cervical spine was performed following the standard protocol without intravenous contrast. Multiplanar CT image reconstructions of the cervical spine were also generated. COMPARISON:  None. FINDINGS: CT HEAD FINDINGS The bony calvarium is intact. Mild mucosal changes are noted in the left maxillary antrum. Diffuse atrophic changes are seen. No findings to suggest acute hemorrhage, acute infarction or space-occupying mass lesion are noted. CT CERVICAL SPINE FINDINGS Seven cervical segments are well visualized. Vertebral body height is well maintained. Disc space narrowing is noted at C5-6 and C6-7 with associated osteophytic changes. Multilevel facet hypertrophic changes are seen. No acute fracture or acute facet abnormality is noted. The visualized lung apices demonstrate mild scarring bilaterally. A small left pleural effusion is seen. IMPRESSION: CT of the head: Chronic atrophic changes without acute abnormality. CT of the cervical spine: Multilevel degenerative changes without acute bony abnormality. Small left pleural effusion is seen. Electronically Signed   By: Alcide Clever M.D.   On: 12/23/2015 19:02   Ct Angio  Chest Pe W And/or Wo Contrast  Result Date: 12/23/2015 CLINICAL DATA:  Recent fall with chest pain and shortness of Breath EXAM: CT ANGIOGRAPHY CHEST WITH CONTRAST TECHNIQUE: Multidetector CT imaging of the chest was performed using the standard protocol during bolus administration of intravenous contrast. Multiplanar CT image reconstructions and MIPs were obtained to evaluate the vascular anatomy. CONTRAST:  80 mL Isovue 370. COMPARISON:  None. FINDINGS: Mediastinum/Lymph Nodes: No significant hilar or mediastinal adenopathy is identified. The thoracic inlet is within normal limits. Cardiovascular: Diffuse aortic calcifications are seen without aneurysmal dilatation or dissection. The pulmonary artery shows a normal branching pattern without intraluminal filling defect to suggest pulmonary embolism. Coronary calcifications are noted. Mitral valve replacement is seen Lungs/Pleura: Mild left lower lobe atelectasis is noted with associated small effusion. No pneumothorax is seen. Upper abdomen: No acute abnormality noted. Musculoskeletal: Mildly displaced fractures of the left seventh and eighth ribs Review of the MIP images confirms the above findings. IMPRESSION: No evidence of pulmonary emboli. Fractures of the left seventh and eighth ribs without pneumothorax. Associated left lower  lobe atelectasis and effusion is noted. Electronically Signed   By: Alcide Clever M.D.   On: 12/23/2015 19:10   Ct Cervical Spine Wo Contrast  Result Date: 12/23/2015 CLINICAL DATA:  Un witnessed fall with dizziness and neck pain, initial encounter EXAM: CT HEAD WITHOUT CONTRAST CT CERVICAL SPINE WITHOUT CONTRAST TECHNIQUE: Multidetector CT imaging of the head and cervical spine was performed following the standard protocol without intravenous contrast. Multiplanar CT image reconstructions of the cervical spine were also generated. COMPARISON:  None. FINDINGS: CT HEAD FINDINGS The bony calvarium is intact. Mild mucosal changes are  noted in the left maxillary antrum. Diffuse atrophic changes are seen. No findings to suggest acute hemorrhage, acute infarction or space-occupying mass lesion are noted. CT CERVICAL SPINE FINDINGS Seven cervical segments are well visualized. Vertebral body height is well maintained. Disc space narrowing is noted at C5-6 and C6-7 with associated osteophytic changes. Multilevel facet hypertrophic changes are seen. No acute fracture or acute facet abnormality is noted. The visualized lung apices demonstrate mild scarring bilaterally. A small left pleural effusion is seen. IMPRESSION: CT of the head: Chronic atrophic changes without acute abnormality. CT of the cervical spine: Multilevel degenerative changes without acute bony abnormality. Small left pleural effusion is seen. Electronically Signed   By: Alcide Clever M.D.   On: 12/23/2015 19:02    Time Spent in minutes  25   Eddie North M.D on 12/26/2015 at 5:04 PM  Between 7am to 7pm - Pager - [phone]  After 7pm go to [email or web address] - password [facility]  [facility] -  Office  [phone]

## 2015-12-26 NOTE — [place] FL2 (Signed)
[place] MEDICAID FL2 LEVEL OF CARE SCREENING TOOL     IDENTIFICATION  Patient Name: Kathy Peterson Birthdate: November 30, 1926 Sex: female Admission Date (Current Location): 12/23/2015  [facility] and [place] Number:  Producer, television/film/video and Address:  [facility],  [address], [place] [address]      Provider Number: [id number]  Attending Physician Name and Address:  Eddie North, MD  Relative Name and Phone Number:  Corrie Mckusick: [phone]    Current Level of Care: Hospital Recommended Level of Care: Skilled Nursing Facility Prior Approval Number:    Date Approved/Denied:   PASRR Number:    Discharge Plan: SNF    Current Diagnoses: Patient Active Problem List   Diagnosis Date Noted  . Episode of recurrent major depressive disorder (HCC)   . FTT (failure to thrive) in adult   . Hyponatremia 12/24/2015  . Rib fracture 12/24/2015  . Rib fractures 12/23/2015  . Atrial fibrillation (HCC) 12/23/2015  . CHF (congestive heart failure) (HCC) 12/23/2015  . COPD (chronic obstructive pulmonary disease) (HCC) 12/23/2015  . Diabetes mellitus, type 2 (HCC) 12/23/2015  . Essential (primary) hypertension 12/23/2015  . Hypothyroidism 12/23/2015  . Falls 12/23/2015    Orientation RESPIRATION BLADDER Height & Weight     Situation, Place  Normal Incontinent Weight: 152 lb (68.9 kg) Height:  5\' 6"  (167.6 cm)  BEHAVIORAL SYMPTOMS/MOOD NEUROLOGICAL BOWEL NUTRITION STATUS      Incontinent Diet (Carb Modified)  AMBULATORY STATUS COMMUNICATION OF NEEDS Skin   Extensive Assist Verbally Normal                       Personal Care Assistance Level of Assistance  Bathing, Feeding, Dressing Bathing Assistance: Limited assistance Feeding assistance: Independent Dressing Assistance: Limited assistance     Functional Limitations Info  Sight, Hearing, Speech Sight Info: Impaired Hearing Info: Impaired Speech Info: Adequate    SPECIAL CARE FACTORS FREQUENCY  PT (By  licensed PT)     PT Frequency: 3              Contractures Contractures Info: Not present    Additional Factors Info  Code Status, Allergies, Psychotropic Code Status Info: Full Code Allergies Info: Penicillins, Pneumovax Pneumococcal Polysaccharide Vaccine, Statins, Sulfa Antibiotics Psychotropic Info: Yes Insulin Sliding Scale Info: insulin aspart (novoLOG) injection 0-15 Units       Current Medications (12/26/2015):  This is the current hospital active medication list Current Facility-Administered Medications  Medication Dose Route Frequency Provider Last Rate Last Dose  . acetaminophen (TYLENOL) suppository 650 mg  650 mg Rectal Q6H PRN 02/25/2016, MD      . acetaminophen (TYLENOL) tablet 1,000 mg  1,000 mg Oral Q8H Jonah Blue, MD   1,000 mg at 12/26/15 0629  . albuterol (PROVENTIL) (2.5 MG/3ML) 0.083% nebulizer solution 3 mL  3 mL Inhalation Q6H PRN 02/25/16, MD   3 mL at 12/24/15 2031  . ciclopirox (LOPROX) 0.77 % cream 1 application  1 application Topical BID PRN 2032, MD      . diltiazem (CARDIZEM CD) 24 hr capsule 120 mg  120 mg Oral QHS Jonah Blue, MD   120 mg at 12/25/15 2246  . docusate sodium (COLACE) capsule 100 mg  100 mg Oral BID 2247, MD   100 mg at 12/25/15 2246  . DULoxetine (CYMBALTA) DR capsule 30 mg  30 mg Oral Daily 2247, MD      . enoxaparin (LOVENOX) injection 40 mg  40 mg Subcutaneous QHS Jonah Blue, MD   40 mg at 12/25/15 2246  . furosemide (LASIX) tablet 10 mg  10 mg Oral Daily Jonah Blue, MD   10 mg at 12/25/15 0914  . insulin aspart (novoLOG) injection 0-15 Units  0-15 Units Subcutaneous TID WC Jonah Blue, MD   2 Units at 12/25/15 1718  . ipratropium (ATROVENT) nebulizer solution 0.5 mg  0.5 mg Nebulization Q6H PRN Jonah Blue, MD      . ketorolac (TORADOL) 15 MG/ML injection 15 mg  15 mg Intravenous Q6H PRN Jonah Blue, MD   15 mg at 12/24/15 0517  . levothyroxine (SYNTHROID,  LEVOTHROID) tablet 100 mcg  100 mcg Oral QAC breakfast Jonah Blue, MD   100 mcg at 12/25/15 0914  . loratadine (CLARITIN) tablet 10 mg  10 mg Oral Daily Jonah Blue, MD   10 mg at 12/25/15 0914  . losartan (COZAAR) tablet 25 mg  25 mg Oral Daily Jonah Blue, MD   25 mg at 12/25/15 0914  . mometasone-formoterol (DULERA) 200-5 MCG/ACT inhaler 2 puff  2 puff Inhalation BID Jonah Blue, MD   2 puff at 12/25/15 2132  . MUSCLE RUB CREA 1 application  1 application Topical TID Jonah Blue, MD   1 application at 12/25/15 2245  . oxyCODONE (Oxy IR/ROXICODONE) immediate release tablet 5 mg  5 mg Oral Q6H PRN Jeralyn Bennett, MD      . pantoprazole (PROTONIX) EC tablet 40 mg  40 mg Oral Daily Jonah Blue, MD   40 mg at 12/25/15 0914  . polyvinyl alcohol (LIQUIFILM TEARS) 1.4 % ophthalmic solution 1 drop  1 drop Both Eyes Q12H Jonah Blue, MD   1 drop at 12/25/15 2245  . psyllium (HYDROCIL/METAMUCIL) packet 1 packet  1 packet Oral QHS Jonah Blue, MD   1 packet at 12/25/15 2246  . sertraline (ZOLOFT) tablet 50 mg  50 mg Oral Daily Leata Mouse, MD   50 mg at 12/25/15 0914  . sodium chloride flush (NS) 0.9 % injection 3 mL  3 mL Intravenous Q12H Jonah Blue, MD   3 mL at 12/25/15 2247  . traZODone (DESYREL) tablet 50 mg  50 mg Oral QHS PRN Leata Mouse, MD         Discharge Medications: Please see discharge summary for a list of discharge medications.  Relevant Imaging Results:  Relevant Lab Results:   Additional Information ss#[id number]  Clearance Coots, LCSW

## 2015-12-27 DIAGNOSIS — W19XXXD Unspecified fall, subsequent encounter: Secondary | ICD-10-CM

## 2015-12-27 DIAGNOSIS — E871 Hypo-osmolality and hyponatremia: Secondary | ICD-10-CM

## 2015-12-27 DIAGNOSIS — E876 Hypokalemia: Secondary | ICD-10-CM

## 2015-12-27 LAB — URINALYSIS, ROUTINE W REFLEX MICROSCOPIC
Bilirubin Urine: NEGATIVE
GLUCOSE, UA: NEGATIVE mg/dL
HGB URINE DIPSTICK: NEGATIVE
Ketones, ur: NEGATIVE mg/dL
Leukocytes, UA: NEGATIVE
Nitrite: NEGATIVE
Protein, ur: NEGATIVE mg/dL
SPECIFIC GRAVITY, URINE: 1.015 (ref 1.005–1.030)
pH: 6 (ref 5.0–8.0)

## 2015-12-27 LAB — GLUCOSE, CAPILLARY
Glucose-Capillary: 105 mg/dL — ABNORMAL HIGH (ref 65–99)
Glucose-Capillary: 130 mg/dL — ABNORMAL HIGH (ref 65–99)

## 2015-12-27 MED ORDER — MENTHOL (TOPICAL ANALGESIC) 4 % EX GEL: 1.0000 "application " | Freq: Three times a day (TID) | CUTANEOUS | 0 refills | Status: AC

## 2015-12-27 MED ORDER — TRAZODONE HCL 50 MG PO TABS: 50.0000 mg | ORAL_TABLET | Freq: Every evening | ORAL | 0 refills | Status: DC | PRN

## 2015-12-27 MED ORDER — DOCUSATE SODIUM 100 MG PO CAPS: 100.0000 mg | ORAL_CAPSULE | Freq: Two times a day (BID) | ORAL | 0 refills | Status: DC

## 2015-12-27 MED ORDER — SERTRALINE HCL 50 MG PO TABS: 50.0000 mg | ORAL_TABLET | Freq: Every day | ORAL | 0 refills | Status: DC

## 2015-12-27 MED ORDER — OXYCODONE HCL 5 MG PO TABS: 5.0000 mg | ORAL_TABLET | Freq: Four times a day (QID) | ORAL | 0 refills | Status: DC | PRN

## 2015-12-27 MED ORDER — DULOXETINE HCL 30 MG PO CPEP: 30.0000 mg | ORAL_CAPSULE | Freq: Every day | ORAL | 0 refills | Status: DC

## 2015-12-27 NOTE — Discharge Summary (Signed)
Physician Discharge Summary  Maren Maney JJH:[id number] DOB: [birth year] DOA: 12/23/2015  PCP: No primary care provider on file.  Admit date: 12/23/2015 Discharge date: 12/27/2015  Admitted From: HOME Disposition:  SNF  Recommendations for Outpatient Follow-up:  1. Follow up with MD at SNF  in 1 weeks 2.  Home Health:no Equipment/Devices: walker/ assitance  Discharge Condition: FAIR CODE STATUS: full code Diet recommendation: heart healthy /Carb Modified     Discharge Diagnoses:  Principal Problem:   Rib fractures  Active Problems:   Atrial fibrillation (HCC)   CHF (congestive heart failure) (HCC)   COPD (chronic obstructive pulmonary disease) (HCC)   Diabetes mellitus, type 2 (HCC)   Essential (primary) hypertension   Hypothyroidism   Falls   Hyponatremia   Episode of recurrent major depressive disorder (HCC)   FTT (failure to thrive) in adult  Brief narrative/ HPI 82 year old female resident of assisted living facility, history of A. fib, diabetes mellitus, COPD admitted with failure to thrive with multiple falls. Patient was found to have fracture of the left seventh and eighth ribs in the ED without evidence of pneumothorax. Admitted for PT evaluation and pain management. Seen by physical therapy and recommended skilled nursing facility.  Principal Problem:   Left Rib fractures Secondary to recurrent falls. Pain well controlled on when necessary oxycodone. Provided incentive spirometry at bedside. Seen by PT and recommend skilled nursing facility.  Active Problems: Dizziness Possibly due to orthostasis. Now resolved.  Major depression Was on sertraline as outpatient. Seen by psychiatry who recommended starting Cymbalta and is use lower dose of Zoloft. Added prn trazodone for sleep .Isymptoms much better.   Hypokalemia Replenished     Atrial fibrillation (HCC) Not on anticoagulation possibly due to fall risk      COPD (chronic obstructive pulmonary  disease) (HCC) Stable. Continue when necessary nebs.    Diabetes mellitus, type 2 (HCC) Stable on sliding scale coverage. Not on meds at home.    Essential (primary) hypertension Stable. Continue home medications    Hypothyroidism Continue Synthroid. Normal TSH  Hyponatremia Secondary to dehydration. Improved with fluids.           Family Communication  : none At bedside  Disposition Plan  : SNF     Consults  :  psychiatry  Procedures  : none  Discharge Instructions     Medication List    STOP taking these medications   traMADol 50 MG tablet Commonly known as:  ULTRAM     TAKE these medications   acetaminophen 500 MG tablet Commonly known as:  TYLENOL Take 500 mg by mouth 3 (three) times daily. Pt is also able to use every eight hours as needed for pain.   ADVAIR HFA 115-21 MCG/ACT inhaler Generic drug:  fluticasone-salmeterol Inhale 2 puffs into the lungs 2 (two) times daily.   albuterol 108 (90 Base) MCG/ACT inhaler Commonly known as:  PROVENTIL HFA;VENTOLIN HFA Inhale 2 puffs into the lungs every 6 (six) hours as needed for wheezing or shortness of breath.   CALCIUM 600+D 600-800 MG-UNIT Tabs Generic drug:  Calcium Carb-Cholecalciferol Take 1 tablet by mouth daily.   ciclopirox 0.77 % cream Commonly known as:  LOPROX Apply 1 application topically 2 (two) times daily as needed (for rash under breasts).   diltiazem 120 MG 24 hr capsule Commonly known as:  DILACOR XR Take 120 mg by mouth at bedtime.   docusate sodium 100 MG capsule Commonly known as:  COLACE Take 1 capsule (100 mg total)  by mouth 2 (two) times daily.   DULoxetine 30 MG capsule Commonly known as:  CYMBALTA Take 1 capsule (30 mg total) by mouth daily.   furosemide 20 MG tablet Commonly known as:  LASIX Take 10 mg by mouth daily.   ipratropium 0.02 % nebulizer solution Commonly known as:  ATROVENT Take 0.5 mg by nebulization every 6 (six) hours as  needed for wheezing or shortness of breath.   levothyroxine 100 MCG tablet Commonly known as:  SYNTHROID, LEVOTHROID Take 100 mcg by mouth daily before breakfast.   loratadine 10 MG tablet Commonly known as:  CLARITIN Take 10 mg by mouth daily.   losartan 25 MG tablet Commonly known as:  COZAAR Take 25 mg by mouth daily.   Melatonin 3 MG Tabs Take 3 mg by mouth at bedtime.   Menthol (Topical Analgesic) 4 % Gel Commonly known as:  BIOFREEZE Apply 1 application topically 3 (three) times daily. Pt applies to left side and back.   OPTIVE SENSITIVE 0.5-0.9 % Soln Generic drug:  Carboxymethylcell-Glycerin PF Place 1 drop into both eyes every 12 (twelve) hours.   oxyCODONE 5 MG immediate release tablet Commonly known as:  Oxy IR/ROXICODONE Take 1 tablet (5 mg total) by mouth every 6 (six) hours as needed for moderate pain or severe pain.   pantoprazole 40 MG tablet Commonly known as:  PROTONIX Take 40 mg by mouth daily.   psyllium 0.52 g capsule Commonly known as:  REGULOID Take 0.52 g by mouth at bedtime.   sertraline 50 MG tablet Commonly known as:  ZOLOFT Take 1 tablet (50 mg total) by mouth daily. What changed:  medication strength  how much to take   traZODone 50 MG tablet Commonly known as:  DESYREL Take 1 tablet (50 mg total) by mouth at bedtime as needed for sleep.       Allergies  Allergen Reactions  . Penicillins Other (See Comments)    Reaction:  Unknown  Has patient had a PCN reaction causing immediate rash, facial/tongue/throat swelling, SOB or lightheadedness with hypotension: Unsure Has patient had a PCN reaction causing severe rash involving mucus membranes or skin necrosis: Unsure Has patient had a PCN reaction that required hospitalization Unsure Has patient had a PCN reaction occurring within the last 10 years: Unsure If all of the above answers are "NO", then may proceed with Cephalosporin use.  . Pneumovax [Pneumococcal Polysaccharide  Vaccine] Other (See Comments)    Reaction:  Unknown   . Statins Other (See Comments)    Reaction:  Unknown   . Sulfa Antibiotics Other (See Comments)    Reaction:  Unknown         Procedures/Studies: Ct Head Wo Contrast  Result Date: 12/23/2015 CLINICAL DATA:  Un witnessed fall with dizziness and neck pain, initial encounter EXAM: CT HEAD WITHOUT CONTRAST CT CERVICAL SPINE WITHOUT CONTRAST TECHNIQUE: Multidetector CT imaging of the head and cervical spine was performed following the standard protocol without intravenous contrast. Multiplanar CT image reconstructions of the cervical spine were also generated. COMPARISON:  None. FINDINGS: CT HEAD FINDINGS The bony calvarium is intact. Mild mucosal changes are noted in the left maxillary antrum. Diffuse atrophic changes are seen. No findings to suggest acute hemorrhage, acute infarction or space-occupying mass lesion are noted. CT CERVICAL SPINE FINDINGS Seven cervical segments are well visualized. Vertebral body height is well maintained. Disc space narrowing is noted at C5-6 and C6-7 with associated osteophytic changes. Multilevel facet hypertrophic changes are seen. No acute fracture or  acute facet abnormality is noted. The visualized lung apices demonstrate mild scarring bilaterally. A small left pleural effusion is seen. IMPRESSION: CT of the head: Chronic atrophic changes without acute abnormality. CT of the cervical spine: Multilevel degenerative changes without acute bony abnormality. Small left pleural effusion is seen. Electronically Signed   By: Alcide Clever M.D.   On: 12/23/2015 19:02   Ct Angio Chest Pe W And/or Wo Contrast  Result Date: 12/23/2015 CLINICAL DATA:  Recent fall with chest pain and shortness of Breath EXAM: CT ANGIOGRAPHY CHEST WITH CONTRAST TECHNIQUE: Multidetector CT imaging of the chest was performed using the standard protocol during bolus administration of intravenous contrast. Multiplanar CT image reconstructions and  MIPs were obtained to evaluate the vascular anatomy. CONTRAST:  80 mL Isovue 370. COMPARISON:  None. FINDINGS: Mediastinum/Lymph Nodes: No significant hilar or mediastinal adenopathy is identified. The thoracic inlet is within normal limits. Cardiovascular: Diffuse aortic calcifications are seen without aneurysmal dilatation or dissection. The pulmonary artery shows a normal branching pattern without intraluminal filling defect to suggest pulmonary embolism. Coronary calcifications are noted. Mitral valve replacement is seen Lungs/Pleura: Mild left lower lobe atelectasis is noted with associated small effusion. No pneumothorax is seen. Upper abdomen: No acute abnormality noted. Musculoskeletal: Mildly displaced fractures of the left seventh and eighth ribs Review of the MIP images confirms the above findings. IMPRESSION: No evidence of pulmonary emboli. Fractures of the left seventh and eighth ribs without pneumothorax. Associated left lower lobe atelectasis and effusion is noted. Electronically Signed   By: Alcide Clever M.D.   On: 12/23/2015 19:10   Ct Cervical Spine Wo Contrast  Result Date: 12/23/2015 CLINICAL DATA:  Un witnessed fall with dizziness and neck pain, initial encounter EXAM: CT HEAD WITHOUT CONTRAST CT CERVICAL SPINE WITHOUT CONTRAST TECHNIQUE: Multidetector CT imaging of the head and cervical spine was performed following the standard protocol without intravenous contrast. Multiplanar CT image reconstructions of the cervical spine were also generated. COMPARISON:  None. FINDINGS: CT HEAD FINDINGS The bony calvarium is intact. Mild mucosal changes are noted in the left maxillary antrum. Diffuse atrophic changes are seen. No findings to suggest acute hemorrhage, acute infarction or space-occupying mass lesion are noted. CT CERVICAL SPINE FINDINGS Seven cervical segments are well visualized. Vertebral body height is well maintained. Disc space narrowing is noted at C5-6 and C6-7 with associated  osteophytic changes. Multilevel facet hypertrophic changes are seen. No acute fracture or acute facet abnormality is noted. The visualized lung apices demonstrate mild scarring bilaterally. A small left pleural effusion is seen. IMPRESSION: CT of the head: Chronic atrophic changes without acute abnormality. CT of the cervical spine: Multilevel degenerative changes without acute bony abnormality. Small left pleural effusion is seen. Electronically Signed   By: Alcide Clever M.D.   On: 12/23/2015 19:02       Subjective: Feels better. Left sided rib pain much improved  Discharge Exam: Vitals:   12/26/15 2117 12/27/15 0625  BP: (!) 148/78 (!) 158/86  Pulse: 88 78  Resp: 18 20  Temp: 98.3 F (36.8 C) 98.8 F (37.1 C)   Vitals:   12/26/15 1323 12/26/15 2117 12/27/15 0625 12/27/15 0809  BP: (!) 118/99 (!) 148/78 (!) 158/86   Pulse: 73 88 78   Resp: 17 18 20    Temp: 98.3 F (36.8 C) 98.3 F (36.8 C) 98.8 F (37.1 C)   TempSrc: Oral Oral Oral   SpO2: 99% 99% 99% 93%  Weight:      Height:  Gen: not in distress HEENT: moist mucosa, supple neck Chest: clear b/l, no added sounds, minimal Tenderness  to palpation over left ribs CVS: N S1&S2, no murmurs,  GI: soft, NT, ND,  Musculoskeletal: warm, no edema CNS: Alert and oriented  The results of significant diagnostics from this hospitalization (including imaging, microbiology, ancillary and laboratory) are listed below for reference.     Microbiology: Recent Results (from the past 240 hour(s))  MRSA PCR Screening     Status: None   Collection Time: 12/23/15  9:14 PM  Result Value Ref Range Status   MRSA by PCR NEGATIVE NEGATIVE Final    Comment:        The GeneXpert MRSA Assay (FDA approved for NASAL specimens only), is one component of a comprehensive MRSA colonization surveillance program. It is not intended to diagnose MRSA infection nor to guide or monitor treatment for MRSA infections.      Labs: BNP  (last 3 results)  Recent Labs  12/23/15 1710  BNP 855.7*   Basic Metabolic Panel:  Recent Labs Lab 12/23/15 1710 12/24/15 0428 12/25/15 0441  NA 132* 132* 135  K 3.7 3.1* 3.5  CL 96* 96* 102  CO2 GLUCOSE 126* 94 107*  BUN CREATININE 0.94 0.83 0.81  CALCIUM 9.3 8.9 8.6*   Liver Function Tests:  Recent Labs Lab 12/23/15 1710  AST 17  ALT 13*  ALKPHOS 79  BILITOT 0.7  PROT 6.7  ALBUMIN 4.1   No results for input(s): LIPASE, AMYLASE in the last 168 hours. No results for input(s): AMMONIA in the last 168 hours. CBC:  Recent Labs Lab 12/23/15 1710 12/24/15 0428 12/25/15 0441  WBC 7.5 7.5 6.2  NEUTROABS 5.6  --   --   HGB 13.5 12.4 11.9*  HCT 39.8 35.7* 35.2*  MCV 79.9 79.7 79.6  PLT 245 236 227   Cardiac Enzymes: No results for input(s): CKTOTAL, CKMB, CKMBINDEX, TROPONINI in the last 168 hours. BNP: Invalid input(s): POCBNP CBG:  Recent Labs Lab 12/26/15 0736 12/26/15 1125 12/26/15 1659 12/26/15 2119 12/27/15 0726  GLUCAP 113* 161* 125* 161* 105*   D-Dimer No results for input(s): DDIMER in the last 72 hours. Hgb A1c No results for input(s): HGBA1C in the last 72 hours. Lipid Profile No results for input(s): CHOL, HDL, LDLCALC, TRIG, CHOLHDL, LDLDIRECT in the last 72 hours. Thyroid function studies No results for input(s): TSH, T4TOTAL, T3FREE, THYROIDAB in the last 72 hours.  Invalid input(s): FREET3 Anemia work up No results for input(s): VITAMINB12, FOLATE, FERRITIN, TIBC, IRON, RETICCTPCT in the last 72 hours. Urinalysis    Component Value Date/Time   COLORURINE YELLOW 12/27/2015 0710   APPEARANCEUR CLEAR 12/27/2015 0710   LABSPEC 1.015 12/27/2015 0710   PHURINE 6.0 12/27/2015 0710   GLUCOSEU NEGATIVE 12/27/2015 0710   HGBUR NEGATIVE 12/27/2015 0710   BILIRUBINUR NEGATIVE 12/27/2015 0710   KETONESUR NEGATIVE 12/27/2015 0710   PROTEINUR NEGATIVE 12/27/2015 0710   NITRITE NEGATIVE 12/27/2015 0710    LEUKOCYTESUR NEGATIVE 12/27/2015 0710   Sepsis Labs Invalid input(s): PROCALCITONIN,  WBC,  LACTICIDVEN Microbiology Recent Results (from the past 240 hour(s))  MRSA PCR Screening     Status: None   Collection Time: 12/23/15  9:14 PM  Result Value Ref Range Status   MRSA by PCR NEGATIVE NEGATIVE Final    Comment:        The GeneXpert MRSA Assay (FDA approved for NASAL specimens only), is one component of a  comprehensive MRSA colonization surveillance program. It is not intended to diagnose MRSA infection nor to guide or monitor treatment for MRSA infections.      Time coordinating discharge: <30 minutes  SIGNED:   Eddie North, MD  [facility] 12/27/2015, 10:57 AM Pager   If 7PM-7AM, please contact night-coverage [email or web address] Password [email or web address]

## 2015-12-27 NOTE — Clinical Social Work Placement (Signed)
Patient is set to discharge to [facility] today. Patient & daughter, [name] aware. Discharge packet given to RN, Ene . [facility] called for transport to pickup at 1:15pm.     Lincoln Maxin, LCSW [facility] Clinical Social Worker cell #: [phone]    CLINICAL SOCIAL WORK PLACEMENT  NOTE  Date:  12/27/2015  Patient Details  Name: Kathy Peterson MRN: [id number] Date of Birth: June 01, 1926  Clinical Social Work is seeking post-discharge placement for this patient at the Skilled  Nursing Facility level of care (*CSW will initial, date and re-position this form in  chart as items are completed):  Yes   Patient/family provided with [facility] Clinical Social Work Department's list of facilities offering this level of care within the geographic area requested by the patient (or if unable, by the patient's family).  Yes   Patient/family informed of their freedom to choose among providers that offer the needed level of care, that participate in Medicare, Medicaid or managed care program needed by the patient, have an available bed and are willing to accept the patient.  Yes   Patient/family informed of 's ownership interest in [facility] and [facility], as well as of the fact that they are under no obligation to receive care at these facilities.  PASRR submitted to EDS on       PASRR number received on       Existing PASRR number confirmed on 12/27/15     FL2 transmitted to all facilities in geographic area requested by pt/family on       FL2 transmitted to all facilities within larger geographic area on 12/25/15     Patient informed that his/her managed care company has contracts with or will negotiate with certain facilities, including the following:  [name]     Yes   Patient/family informed of bed offers received.  Patient chooses bed at [facility]     Physician recommends and patient chooses bed at       Patient to be transferred to [facility] on  .  Patient to be transferred to facility by [facility]     Patient family notified on 12/27/15 of transfer.  Name of family member notified:  patient's daughter, [name] made aware.      PHYSICIAN       Additional Comment:    _______________________________________________ Arlyss Repress, LCSW 12/27/2015, 12:20 PM

## 2015-12-27 NOTE — Progress Notes (Signed)
Report called to [place], Charity fundraiser at [place]. Pt is waiting for transport. Pt is resting comfortably in her room. No complain at this time. Will continue to monitor pt

## 2015-12-27 NOTE — Clinical Social Work Placement (Signed)
   CLINICAL SOCIAL WORK PLACEMENT  NOTE  Date:  12/27/2015  Patient Details  Name: Kathy Peterson MRN: [id number] Date of Birth: 16-Mar-1927  Clinical Social Work is seeking post-discharge placement for this patient at the Skilled  Nursing Facility level of care (*CSW will initial, date and re-position this form in  chart as items are completed):  Yes   Patient/family provided with [name] Clinical Social Work Department's list of facilities offering this level of care within the geographic area requested by the patient (or if unable, by the patient's family).  Yes   Patient/family informed of their freedom to choose among providers that offer the needed level of care, that participate in Medicare, Medicaid or managed care program needed by the patient, have an available bed and are willing to accept the patient.  Yes   Patient/family informed of [name]'s ownership interest in [name] and [name], as well as of the fact that they are under no obligation to receive care at these facilities.  PASRR submitted to EDS on       PASRR number received on       Existing PASRR number confirmed on 12/27/15     FL2 transmitted to all facilities in geographic area requested by pt/family on       FL2 transmitted to all facilities within larger geographic area on 12/25/15     Patient informed that his/her managed care company has contracts with or will negotiate with certain facilities, including the following:  [name]     Yes   Patient/family informed of bed offers received.  Patient chooses bed at [name]     Physician recommends and patient chooses bed at      Patient to be transferred to [name] on  .  Patient to be transferred to facility by [facility]     Patient family notified on   of transfer.  Name of family member notified:        PHYSICIAN       Additional Comment:     _______________________________________________ Clearance Coots, LCSW 12/27/2015, 10:52 AM

## 2015-12-28 ENCOUNTER — Encounter: Payer: Self-pay | Admitting: Nurse Practitioner

## 2015-12-28 ENCOUNTER — Non-Acute Institutional Stay (SKILLED_NURSING_FACILITY): Payer: Medicare Other | Admitting: Nurse Practitioner

## 2015-12-28 DIAGNOSIS — S2232XD Fracture of one rib, left side, subsequent encounter for fracture with routine healing: Secondary | ICD-10-CM | POA: Diagnosis not present

## 2015-12-28 DIAGNOSIS — J449 Chronic obstructive pulmonary disease, unspecified: Secondary | ICD-10-CM | POA: Diagnosis not present

## 2015-12-28 DIAGNOSIS — R296 Repeated falls: Secondary | ICD-10-CM

## 2015-12-28 DIAGNOSIS — I482 Chronic atrial fibrillation, unspecified: Secondary | ICD-10-CM

## 2015-12-28 DIAGNOSIS — I10 Essential (primary) hypertension: Secondary | ICD-10-CM

## 2015-12-28 DIAGNOSIS — F330 Major depressive disorder, recurrent, mild: Secondary | ICD-10-CM | POA: Diagnosis not present

## 2015-12-28 DIAGNOSIS — I509 Heart failure, unspecified: Secondary | ICD-10-CM

## 2015-12-28 DIAGNOSIS — R627 Adult failure to thrive: Secondary | ICD-10-CM | POA: Diagnosis not present

## 2015-12-28 DIAGNOSIS — E039 Hypothyroidism, unspecified: Secondary | ICD-10-CM | POA: Diagnosis not present

## 2015-12-28 NOTE — Progress Notes (Signed)
Nursing Home Location:    [facility]  Place of Service: SNF (31)  PCP: No primary care provider on file.  Allergies  Allergen Reactions  . Penicillins Other (See Comments)    Reaction:  Unknown  Has patient had a PCN reaction causing immediate rash, facial/tongue/throat swelling, SOB or lightheadedness with hypotension: Unsure Has patient had a PCN reaction causing severe rash involving mucus membranes or skin necrosis: Unsure Has patient had a PCN reaction that required hospitalization Unsure Has patient had a PCN reaction occurring within the last 10 years: Unsure If all of the above answers are "NO", then may proceed with Cephalosporin use.  . Pneumovax [Pneumococcal Polysaccharide Vaccine] Other (See Comments)    Reaction:  Unknown   . Statins Other (See Comments)    Reaction:  Unknown   . Sulfa Antibiotics Other (See Comments)    Reaction:  Unknown     Chief Complaint  Patient presents with  . Hospitalization Follow-up    [facility] stay 12/23/15 to 12/27/15    HPI:  Patient is a 82 y.o. female seen today at  [facility] to follow up hospitalization. Kathy Peterson resident of assisted living facility she has a  history of A. fib, diabetes mellitus, COPD. She was admitted to the hospital with  failure to thrive with multiple falls. Patient was found to have fracture of the left seventh and eighth ribs in the ED without evidence of pneumothorax. Pt has been found to have severe orthostatic hypotension when she stands up making her very dizzy with PT.  Pt denies pain at this time and has not had any recent pain medication.  Pt with hx of depression and has had medication adjusted in hospital.  Review of Systems:  Review of Systems  Past Medical History:  Diagnosis Date  . Arthritis    rheumatoid  . Atrial fibrillation (HCC)   . CHF (congestive heart failure) (HCC)   . COPD (chronic obstructive pulmonary disease) (HCC)   . Depression   . Diabetes mellitus without complication (HCC)      type 2  . GERD (gastroesophageal reflux disease)   . Hypertension   . Pacemaker   . Pneumonia   . Polio   . Shortness of breath   . Thyroid disease    hypothyroid   Past Surgical History:  Procedure Laterality Date  . ABDOMINAL HYSTERECTOMY    . CHOLECYSTECTOMY    . EYE SURGERY     Social History:   reports that she has never smoked. She has never used smokeless tobacco. She reports that she does not drink alcohol or use drugs.  Family History  Problem Relation Age of Onset  . Family history unknown: Yes    Medications: Patient's Medications  New Prescriptions   No medications on file  Previous Medications   ACETAMINOPHEN (TYLENOL) 500 MG TABLET    Take 500 mg by mouth 3 (three) times daily. Pt is also able to use every eight hours as needed for pain.   ALBUTEROL (PROVENTIL HFA;VENTOLIN HFA) 108 (90 BASE) MCG/ACT INHALER    Inhale 2 puffs into the lungs every 6 (six) hours as needed for wheezing or shortness of breath.   CALCIUM CARB-CHOLECALCIFEROL (CALCIUM 600+D) 600-800 MG-UNIT TABS    Take 1 tablet by mouth daily.   CARBOXYMETHYLCELL-GLYCERIN PF (OPTIVE SENSITIVE) 0.5-0.9 % SOLN    Place 1 drop into both eyes every 12 (twelve) hours.   CICLOPIROX (LOPROX) 0.77 % CREAM    Apply 1 application topically  2 (two) times daily as needed (for rash under breasts).   DILTIAZEM (DILACOR XR) 120 MG 24 HR CAPSULE    Take 120 mg by mouth at bedtime. Hold if pulse is less than 55 or if systolic less than 100.   DOCUSATE SODIUM (COLACE) 100 MG CAPSULE    Take 1 capsule (100 mg total) by mouth 2 (two) times daily.   DULOXETINE (CYMBALTA) 30 MG CAPSULE    Take 1 capsule (30 mg total) by mouth daily.   FLUTICASONE-SALMETEROL (ADVAIR HFA) 115-21 MCG/ACT INHALER    Inhale 2 puffs into the lungs 2 (two) times daily.   FUROSEMIDE (LASIX) 20 MG TABLET    Take 10 mg by mouth daily.   IPRATROPIUM (ATROVENT) 0.02 % NEBULIZER SOLUTION    Take 0.5 mg by nebulization every 6 (six) hours as needed for  wheezing or shortness of breath.   LEVOTHYROXINE (SYNTHROID, LEVOTHROID) 100 MCG TABLET    Take 100 mcg by mouth daily before breakfast.   LORATADINE (CLARITIN) 10 MG TABLET    Take 10 mg by mouth daily.   LOSARTAN (COZAAR) 25 MG TABLET    Take 25 mg by mouth daily.   MELATONIN 3 MG TABS    Take 3 mg by mouth at bedtime.   MENTHOL, TOPICAL ANALGESIC, (BIOFREEZE) 4 % GEL    Apply 1 application topically 3 (three) times daily. Pt applies to left side and back.   OXYCODONE (OXY IR/ROXICODONE) 5 MG IMMEDIATE RELEASE TABLET    Take 5 mg by mouth every 6 (six) hours as needed for moderate pain or severe pain. Report mental status changes (PMH of recurrent falls).   PANTOPRAZOLE (PROTONIX) 40 MG TABLET    Take 40 mg by mouth daily.   PSYLLIUM (REGULOID) 0.52 G CAPSULE    Take 0.52 g by mouth at bedtime.   SERTRALINE (ZOLOFT) 25 MG TABLET    Take 25 mg by mouth daily.   TRAZODONE (DESYREL) 50 MG TABLET    Take 25 mg by mouth at bedtime.  Modified Medications   No medications on file  Discontinued Medications   DILTIAZEM (DILACOR XR) 120 MG 24 HR CAPSULE    Take 120 mg by mouth at bedtime.   OXYCODONE (OXY IR/ROXICODONE) 5 MG IMMEDIATE RELEASE TABLET    Take 1 tablet (5 mg total) by mouth every 6 (six) hours as needed for moderate pain or severe pain.   SERTRALINE (ZOLOFT) 50 MG TABLET    Take 1 tablet (50 mg total) by mouth daily.   TRAZODONE (DESYREL) 50 MG TABLET    Take 1 tablet (50 mg total) by mouth at bedtime as needed for sleep.     Physical Exam: Vitals:   12/28/15 0949  BP: (!) 147/71  Pulse: (!) 57  Resp: 20  Temp: 98.5 F (36.9 C)  TempSrc: Oral  SpO2: 96%  Weight: 152 lb (68.9 kg)  Height: 5\' 6"  (1.676 m)    Physical Exam  Constitutional: No distress.  Frail elderly female, NAD  HENT:  Head: Normocephalic and atraumatic.  Mouth/Throat: Oropharynx is clear and moist. No oropharyngeal exudate.  Eyes: Conjunctivae are normal. Pupils are equal, round, and reactive to light.    Neck: Normal range of motion. Neck supple.  Cardiovascular: Normal rate, regular rhythm and normal heart sounds.   Pulmonary/Chest: Effort normal and breath sounds normal.  Abdominal: Soft. Bowel sounds are normal.  Musculoskeletal: She exhibits no edema or tenderness.  Neurological: She is alert.  Skin: Skin is  warm and dry. She is not diaphoretic.  Psychiatric: She has a normal mood and affect.    Labs reviewed: Basic Metabolic Panel:  Recent Labs  96/04/54 1710 12/24/15 0428 12/25/15 0441  NA 132* 132* 135  K 3.7 3.1* 3.5  CL 96* 96* 102  CO2 26 26 26   GLUCOSE 126* 94 107*  BUN 14 17 15   CREATININE 0.94 0.83 0.81  CALCIUM 9.3 8.9 8.6*   Liver Function Tests:  Recent Labs  12/23/15 1710  AST 17  ALT 13*  ALKPHOS 79  BILITOT 0.7  PROT 6.7  ALBUMIN 4.1   No results for input(s): LIPASE, AMYLASE in the last 8760 hours. No results for input(s): AMMONIA in the last 8760 hours. CBC:  Recent Labs  12/23/15 1710 12/24/15 0428 12/25/15 0441  WBC 7.5 7.5 6.2  NEUTROABS 5.6  --   --   HGB 13.5 12.4 11.9*  HCT 39.8 35.7* 35.2*  MCV 79.9 79.7 79.6  PLT 245 236 227   TSH:  Recent Labs  12/24/15 0652  TSH 1.517   A1C: Lab Results  Component Value Date   HGBA1C 6.0 (H) 12/24/2015   Lipid Panel: No results for input(s): CHOL, HDL, LDLCALC, TRIG, CHOLHDL, LDLDIRECT in the last 8760 hours.  Radiological Exams: Ct Head Wo Contrast  Result Date: 12/23/2015 CLINICAL DATA:  Un witnessed fall with dizziness and neck pain, initial encounter EXAM: CT HEAD WITHOUT CONTRAST CT CERVICAL SPINE WITHOUT CONTRAST TECHNIQUE: Multidetector CT imaging of the head and cervical spine was performed following the standard protocol without intravenous contrast. Multiplanar CT image reconstructions of the cervical spine were also generated. COMPARISON:  None. FINDINGS: CT HEAD FINDINGS The bony calvarium is intact. Mild mucosal changes are noted in the left maxillary antrum. Diffuse  atrophic changes are seen. No findings to suggest acute hemorrhage, acute infarction or space-occupying mass lesion are noted. CT CERVICAL SPINE FINDINGS Seven cervical segments are well visualized. Vertebral body height is well maintained. Disc space narrowing is noted at C5-6 and C6-7 with associated osteophytic changes. Multilevel facet hypertrophic changes are seen. No acute fracture or acute facet abnormality is noted. The visualized lung apices demonstrate mild scarring bilaterally. A small left pleural effusion is seen. IMPRESSION: CT of the head: Chronic atrophic changes without acute abnormality. CT of the cervical spine: Multilevel degenerative changes without acute bony abnormality. Small left pleural effusion is seen. Electronically Signed   By: 02/23/2016 M.D.   On: 12/23/2015 19:02   Ct Angio Chest Pe W And/or Wo Contrast  Result Date: 12/23/2015 CLINICAL DATA:  Recent fall with chest pain and shortness of Breath EXAM: CT ANGIOGRAPHY CHEST WITH CONTRAST TECHNIQUE: Multidetector CT imaging of the chest was performed using the standard protocol during bolus administration of intravenous contrast. Multiplanar CT image reconstructions and MIPs were obtained to evaluate the vascular anatomy. CONTRAST:  80 mL Isovue 370. COMPARISON:  None. FINDINGS: Mediastinum/Lymph Nodes: No significant hilar or mediastinal adenopathy is identified. The thoracic inlet is within normal limits. Cardiovascular: Diffuse aortic calcifications are seen without aneurysmal dilatation or dissection. The pulmonary artery shows a normal branching pattern without intraluminal filling defect to suggest pulmonary embolism. Coronary calcifications are noted. Mitral valve replacement is seen Lungs/Pleura: Mild left lower lobe atelectasis is noted with associated small effusion. No pneumothorax is seen. Upper abdomen: No acute abnormality noted. Musculoskeletal: Mildly displaced fractures of the left seventh and eighth ribs Review of  the MIP images confirms the above findings. IMPRESSION: No evidence of pulmonary  emboli. Fractures of the left seventh and eighth ribs without pneumothorax. Associated left lower lobe atelectasis and effusion is noted. Electronically Signed   By: Alcide Clever M.D.   On: 12/23/2015 19:10   Ct Cervical Spine Wo Contrast  Result Date: 12/23/2015 CLINICAL DATA:  Un witnessed fall with dizziness and neck pain, initial encounter EXAM: CT HEAD WITHOUT CONTRAST CT CERVICAL SPINE WITHOUT CONTRAST TECHNIQUE: Multidetector CT imaging of the head and cervical spine was performed following the standard protocol without intravenous contrast. Multiplanar CT image reconstructions of the cervical spine were also generated. COMPARISON:  None. FINDINGS: CT HEAD FINDINGS The bony calvarium is intact. Mild mucosal changes are noted in the left maxillary antrum. Diffuse atrophic changes are seen. No findings to suggest acute hemorrhage, acute infarction or space-occupying mass lesion are noted. CT CERVICAL SPINE FINDINGS Seven cervical segments are well visualized. Vertebral body height is well maintained. Disc space narrowing is noted at C5-6 and C6-7 with associated osteophytic changes. Multilevel facet hypertrophic changes are seen. No acute fracture or acute facet abnormality is noted. The visualized lung apices demonstrate mild scarring bilaterally. A small left pleural effusion is seen. IMPRESSION: CT of the head: Chronic atrophic changes without acute abnormality. CT of the cervical spine: Multilevel degenerative changes without acute bony abnormality. Small left pleural effusion is seen. Electronically Signed   By: Alcide Clever M.D.   On: 12/23/2015 19:02    Assessment/Plan 1. Rib fractures, left, with routine healing, subsequent encounter Stable, not currently requiring medication for pain.   2. Recurrent falls Most likely due to dizziness from orthostatic hypotension. Pt is a fall risk and measures in place to reduce  risk.  -Working with PT/OT at [employer]   3. Mild episode of recurrent major depressive disorder (HCC) Medication recently adjusted. Currently on zoloft 25 and cymbalta 30 mg, will monitor  4. Hypothyroidism, unspecified hypothyroidism type TSH stable, cont synthroid 100 mcg  5. FTT (failure to thrive) in adult To be followed by RD for weight and supplements  6. Chronic atrial fibrillation (HCC) Rate controlled, off anticoagulation due to falls  7. Congestive heart failure, unspecified congestive heart failure chronicity, unspecified congestive heart failure type (HCC) Euvolemic, cont lasix   8. Essential (primary) hypertension Slightly elevated however due to orthostatic hypotension will not adjust medication at this time.   9. Chronic obstructive pulmonary disease, unspecified COPD type (HCC) COPD remains stable, no increase in shortness of breath or cough at this time. To cont advair    Tameem Pullara K. Biagio Borg  [facility] [phone] 8 am - 5 pm) [phone] (after hours)

## 2016-01-01 ENCOUNTER — Non-Acute Institutional Stay (SKILLED_NURSING_FACILITY): Payer: Medicare Other | Admitting: Internal Medicine

## 2016-01-01 ENCOUNTER — Encounter: Payer: Self-pay | Admitting: Internal Medicine

## 2016-01-01 DIAGNOSIS — J449 Chronic obstructive pulmonary disease, unspecified: Secondary | ICD-10-CM | POA: Diagnosis not present

## 2016-01-01 DIAGNOSIS — S2232XD Fracture of one rib, left side, subsequent encounter for fracture with routine healing: Secondary | ICD-10-CM | POA: Diagnosis not present

## 2016-01-01 DIAGNOSIS — R7303 Prediabetes: Secondary | ICD-10-CM | POA: Diagnosis not present

## 2016-01-01 DIAGNOSIS — I482 Chronic atrial fibrillation, unspecified: Secondary | ICD-10-CM

## 2016-01-01 DIAGNOSIS — R296 Repeated falls: Secondary | ICD-10-CM

## 2016-01-01 NOTE — Assessment & Plan Note (Signed)
Unfortunately she is unable to do isometric exercises prior to standing Drugs which accentuate postural hypertension should be avoided

## 2016-01-01 NOTE — Assessment & Plan Note (Signed)
Transition to on narcotic pain medicines as quickly as possible as the oxycodone will increase risk of falling

## 2016-01-01 NOTE — Assessment & Plan Note (Deleted)
Continue present pulmonary toilet 

## 2016-01-01 NOTE — Assessment & Plan Note (Signed)
Continue present pulmonary toilet 

## 2016-01-01 NOTE — Patient Instructions (Addendum)
Decrease trazodone to 25 mg and give as needed only for insomnia. DC sertraline. Oxycodone will be changed to low-dose tramadol in the next week. This agent will be less likely to increase her risk of falling. Check fasting glucoses weekly; report any glucoses over 200.

## 2016-01-01 NOTE — Progress Notes (Signed)
Facility Location: [facility]  Room Number: 307-A  No primary care provider on file.  Code Status: Full Code    This is a comprehensive admission note to [facility] performed on this date less than 30 days from date of admission. Included are preadmission medical/surgical history;reconciled medication list; family history; social history and comprehensive review of systems.  Corrections and additions to the records were documented . Comprehensive physical exam was also performed. Additionally a clinical summary was entered for each active diagnosis pertinent to this admission in the Problem List to enhance continuity of care.   HPI: The patient was hospitalized 8/6- 12/27/15 for treatment of rib fracture sustained a mechanical fall. She is a long-term resident of assisted living facility with history of recurrent falls. There is no definite cardiac or neurologic prodrome prior to the falls. She did describe some dizziness which was thought to be related to possible orthostasis. This did resolve.  She has major depression. Psychiatry recommended starting Cymbalta and  decreasing the dose of Zoloft. Trazodone was added as needed for sleep. Oxycodone as needed was prescribed for pain control.She received incentive spirometry to prevent progressive atelectasis. She did develop mild anemia with hemoglobin 11.9. Sliding scale insulin was ordered for hyperglycemia. Glucose high was 126;A1c was prediabetic at 6% on 8/7.    Past medical and surgical history: She has chronic atrial fibrillation but is not on anticoagulation because of recurrent falls. She is on pulmonary toilet for COPD with RAD. Other medical diagnoses include essential hypertension and hypothyroidism. TSH is current and therapeutic.  Social history: Never smoker verified  Family history: Mother had a heart attack, she's unsure of any other family history  Review of systems: Although  she knew the month and the year she could not name the governor or president. The answer to most of her questions was "sometimes". Consistent was her complaint of intermittent dizziness rather than frank vertigo. This is more often with position change although she can have it in bed. She denied any associated cardiac or neurologic prodrome prior to the episodes. Denied were any change in heart rhythm or rate prior to the event. There was no associated chest pain or shortness of breath . Also specifically denied prior to the episode were headache, limb weakness, tingling, or numbness. No seizure activity noted. She describes limited movement on the right side since she had polio @ 17. She states she has had breathing issues throughout her life. She feels this might be related to a previous history of diphtheria. She describes chronic anxiety. She also has occasional insomnia is a associated sleep apnea symptoms.  Physical exam:  Pertinent or positive findings: She has complete dentures. There's a large varicosity over the right chin. Heart sounds are markedly distant. Palpation of the pulse reveals the rhythm to be irregular. She has isolated musical wheezing without increased work of breathing or respiratory distress. Pedal pulses are present but decreased. She has mixed PIP/DIP-DIP changes with flexion contractures of the fourth and fifth right fingers. She has trace-1/2+ edema. She is unable to move the right lower extremity.  General appearance:Adequately nourished.   Lymphatic: No lymphadenopathy about the head, neck, axilla . Eyes: No conjunctival inflammation or lid edema is present. There is no scleral icterus. Ears:  External ear exam shows no significant lesions or deformities.   Nose:  External nasal examination shows no deformity or inflammation. Nasal mucosa are pink and moist without lesions ,exudates Oral exam: lips and  gums are healthy appearing.There is no oropharyngeal  erythema or exudate . Neck:  No thyromegaly, masses, tenderness noted.    Heart:  No gallop, murmur, click, rub .  Abdomen:Bowel sounds are normal. Abdomen is soft and nontender with no organomegaly, hernias,masses. GU: deferred . Extremities:  No cyanosis, clubbing  Neurologic exam : Strength decreased  in upper & left lower extremities Balance,Rhomberg,finger to nose testing could not be completed due to clinical state Skin: Warm & dry w/o tenting. No significant lesions or rash.  See clinical summary under each active problem in the Problem List with associated updated therapeutic plan

## 2016-01-01 NOTE — Assessment & Plan Note (Signed)
Rate controlled him a no change indicated Not a candidate for anticoagulation due to recurrent falls

## 2016-01-01 NOTE — Assessment & Plan Note (Signed)
Check fasting glucoses weekly No intervention unless fasting blood glucoses greater than 200

## 2016-01-07 LAB — TSH: TSH: 3.97 u[IU]/mL (ref 0.41–5.90)

## 2016-01-16 ENCOUNTER — Encounter: Payer: Self-pay | Admitting: Nurse Practitioner

## 2016-01-16 ENCOUNTER — Non-Acute Institutional Stay (SKILLED_NURSING_FACILITY): Payer: Medicare Other | Admitting: Nurse Practitioner

## 2016-01-16 DIAGNOSIS — S2232XD Fracture of one rib, left side, subsequent encounter for fracture with routine healing: Secondary | ICD-10-CM

## 2016-01-16 DIAGNOSIS — I482 Chronic atrial fibrillation, unspecified: Secondary | ICD-10-CM

## 2016-01-16 DIAGNOSIS — I509 Heart failure, unspecified: Secondary | ICD-10-CM | POA: Diagnosis not present

## 2016-01-16 DIAGNOSIS — J449 Chronic obstructive pulmonary disease, unspecified: Secondary | ICD-10-CM

## 2016-01-16 DIAGNOSIS — F330 Major depressive disorder, recurrent, mild: Secondary | ICD-10-CM | POA: Diagnosis not present

## 2016-01-16 DIAGNOSIS — E039 Hypothyroidism, unspecified: Secondary | ICD-10-CM

## 2016-01-16 DIAGNOSIS — I10 Essential (primary) hypertension: Secondary | ICD-10-CM

## 2016-01-16 DIAGNOSIS — R296 Repeated falls: Secondary | ICD-10-CM | POA: Diagnosis not present

## 2016-01-16 NOTE — Progress Notes (Signed)
Nursing Home Location: [name]  Place of Service: SNF (31)  PCP: No primary care provider on file.  Allergies  Allergen Reactions  . Penicillins Other (See Comments)    Reaction:  Unknown  Has patient had a PCN reaction causing immediate rash, facial/tongue/throat swelling, SOB or lightheadedness with hypotension: Unsure Has patient had a PCN reaction causing severe rash involving mucus membranes or skin necrosis: Unsure Has patient had a PCN reaction that required hospitalization Unsure Has patient had a PCN reaction occurring within the last 10 years: Unsure If all of the above answers are "NO", then may proceed with Cephalosporin use.  . Pneumovax [Pneumococcal Polysaccharide Vaccine] Other (See Comments)    Reaction:  Unknown   . Statins Other (See Comments)    Reaction:  Unknown   . Sulfa Antibiotics Other (See Comments)    Reaction:  Unknown     Chief Complaint  Patient presents with  . Discharge Note    HPI:  Patient is a 82 y.o. female seen today at [facility] for discharge back to ALF. Ms Mcraney is a  resident of assisted living facility. She has a  history of A. fib, diabetes mellitus, COPD. She was admitted to the hospital with  failure to thrive with multiple falls. Patient was found to have fracture of the left seventh and eighth ribs in the ED without evidence of pneumothorax. Pt has been found to have severe orthostatic hypotension when she stands up making her very dizzy with PT. Pt has worked with PT but remains a 2 person max assist. conts to have complaints of being swimmy headed and dizziness. Uses WC most of the time.  Pt has been seen by psych due to depression and overall mood has improved. Family is requesting pt be transferred back to ALF at this time.   Review of Systems:  Review of Systems  Constitutional: Negative for activity change, appetite change, fatigue and unexpected weight change.  HENT: Negative for hearing loss.   Eyes:  Negative.   Respiratory: Negative for cough and shortness of breath.   Cardiovascular: Negative for chest pain, palpitations and leg swelling.  Gastrointestinal: Negative for abdominal pain, constipation and diarrhea.  Genitourinary: Negative for difficulty urinating and dysuria.  Musculoskeletal: Positive for arthralgias (right knee pain). Negative for myalgias.  Skin: Negative for wound.  Neurological: Positive for dizziness. Negative for weakness.  Psychiatric/Behavioral: Positive for confusion. Negative for agitation and behavioral problems.    Past Medical History:  Diagnosis Date  . Arthritis    rheumatoid  . Atrial fibrillation (HCC)   . CHF (congestive heart failure) (HCC)   . COPD (chronic obstructive pulmonary disease) (HCC)   . Depression   . Diabetes mellitus without complication (HCC)    type 2  . GERD (gastroesophageal reflux disease)   . Hypertension   . Pacemaker   . Pneumonia   . Polio   . Shortness of breath   . Thyroid disease    hypothyroid   Past Surgical History:  Procedure Laterality Date  . ABDOMINAL HYSTERECTOMY    . CHOLECYSTECTOMY    . EYE SURGERY     Social History:   reports that she has never smoked. She has never used smokeless tobacco. She reports that she does not drink alcohol or use drugs.  Family History  Problem Relation Age of Onset  . Heart disease Mother   . Stroke Neg Hx   . Diabetes Neg Hx   . Cancer Neg  Hx     Medications: Patient's Medications  New Prescriptions   No medications on file  Previous Medications   ACETAMINOPHEN (TYLENOL) 500 MG TABLET    Take 500 mg by mouth 3 (three) times daily as needed for mild pain.   ALBUTEROL (PROVENTIL HFA;VENTOLIN HFA) 108 (90 BASE) MCG/ACT INHALER    Inhale 2 puffs into the lungs every 6 (six) hours as needed for wheezing or shortness of breath.   CALCIUM CARB-CHOLECALCIFEROL (CALCIUM 600+D) 600-800 MG-UNIT TABS    Take 1 tablet by mouth daily.   CARBOXYMETHYLCELL-GLYCERIN PF  (OPTIVE SENSITIVE) 0.5-0.9 % SOLN    Place 1 drop into both eyes every 12 (twelve) hours.   CICLOPIROX (LOPROX) 0.77 % CREAM    Apply 1 application topically 2 (two) times daily as needed (for rash under breasts).   DILTIAZEM (DILACOR XR) 120 MG 24 HR CAPSULE    Take 120 mg by mouth at bedtime. Hold if pulse is less than 55 or if systolic less than 100.   DOCUSATE SODIUM (COLACE) 100 MG CAPSULE    Take 1 capsule (100 mg total) by mouth 2 (two) times daily.   DULOXETINE (CYMBALTA) 20 MG CAPSULE    Take 40 mg by mouth daily.   FLUTICASONE-SALMETEROL (ADVAIR HFA) 115-21 MCG/ACT INHALER    Inhale 2 puffs into the lungs 2 (two) times daily.   FUROSEMIDE (LASIX) 20 MG TABLET    Take 10 mg by mouth daily.   IPRATROPIUM (ATROVENT) 0.02 % NEBULIZER SOLUTION    Take 0.5 mg by nebulization every 6 (six) hours as needed for wheezing or shortness of breath.   LEVOTHYROXINE (SYNTHROID, LEVOTHROID) 100 MCG TABLET    Take 100 mcg by mouth daily before breakfast.   LORATADINE (CLARITIN) 10 MG TABLET    Take 10 mg by mouth daily.   LOSARTAN (COZAAR) 25 MG TABLET    Take 25 mg by mouth daily.   MELATONIN 3 MG TABS    Take 3 mg by mouth at bedtime.   MENTHOL, TOPICAL ANALGESIC, (BIOFREEZE) 4 % GEL    Apply topically 3 (three) times daily. Patient applies to left side and back   PANTOPRAZOLE (PROTONIX) 40 MG TABLET    Take 40 mg by mouth daily.   PSYLLIUM (REGULOID) 0.52 G CAPSULE    Take 0.52 g by mouth at bedtime.   TRAMADOL (ULTRAM) 50 MG TABLET    Give 1/2 to 1 tablet by mouth every 8 hours as needed for pain that is uncontrolled by tylenol.   TRAZODONE (DESYREL) 50 MG TABLET    Take 25 mg by mouth at bedtime as needed for sleep.   Modified Medications   No medications on file  Discontinued Medications   DULOXETINE (CYMBALTA) 30 MG CAPSULE    Take 1 capsule (30 mg total) by mouth daily.   OXYCODONE (OXY IR/ROXICODONE) 5 MG IMMEDIATE RELEASE TABLET    Take 5 mg by mouth every 6 (six) hours as needed for moderate  pain or severe pain. Report mental status changes (PMH of recurrent falls).   SERTRALINE (ZOLOFT) 25 MG TABLET    Take 25 mg by mouth daily.     Physical Exam: Vitals:   01/16/16 1433  BP: 125/60  Pulse: 84  Resp: 18  Temp: (!) 96.9 F (36.1 C)  SpO2: 97%  Weight: 156 lb 12.8 oz (71.1 kg)  Height: 5\' 6"  (1.676 m)    Physical Exam  Constitutional: No distress.  Frail elderly female, NAD  HENT:  Head: Normocephalic and atraumatic.  Mouth/Throat: Oropharynx is clear and moist. No oropharyngeal exudate.  Eyes: Conjunctivae are normal. Pupils are equal, round, and reactive to light.  Neck: Normal range of motion. Neck supple.  Cardiovascular: Normal rate, regular rhythm and normal heart sounds.   Pulmonary/Chest: Effort normal.  Diminished in the bases  Abdominal: Soft. Bowel sounds are normal.  Musculoskeletal: She exhibits no edema or tenderness.  Generalized weakness  Neurological: She is alert.  Skin: Skin is warm and dry. She is not diaphoretic.  Psychiatric: She has a normal mood and affect.    Labs reviewed: Basic Metabolic Panel:  Recent Labs  80/16/55 1710 12/24/15 0428 12/25/15 0441  NA 132* 132* 135  K 3.7 3.1* 3.5  CL 96* 96* 102  CO2 26 26 26   GLUCOSE 126* 94 107*  BUN 14 17 15   CREATININE 0.94 0.83 0.81  CALCIUM 9.3 8.9 8.6*   Liver Function Tests:  Recent Labs  12/23/15 1710  AST 17  ALT 13*  ALKPHOS 79  BILITOT 0.7  PROT 6.7  ALBUMIN 4.1   No results for input(s): LIPASE, AMYLASE in the last 8760 hours. No results for input(s): AMMONIA in the last 8760 hours. CBC:  Recent Labs  12/23/15 1710 12/24/15 0428 12/25/15 0441  WBC 7.5 7.5 6.2  NEUTROABS 5.6  --   --   HGB 13.5 12.4 11.9*  HCT 39.8 35.7* 35.2*  MCV 79.9 79.7 79.6  PLT 245 236 227   TSH:  Recent Labs  12/24/15 0652 01/07/16  TSH 1.517 3.97   A1C: Lab Results  Component Value Date   HGBA1C 6.0 (H) 12/24/2015   Lipid Panel: No results for input(s): CHOL,  HDL, LDLCALC, TRIG, CHOLHDL, LDLDIRECT in the last 8760 hours.   Vitamin B12 01/07/16: 452 Folate, Serum 01/07/16: 8.1 Vitamin D, 25 Hydroxy, Total 01/07/16: 38  Radiological Exams: Ct Head Wo Contrast  Result Date: 12/23/2015 CLINICAL DATA:  Un witnessed fall with dizziness and neck pain, initial encounter EXAM: CT HEAD WITHOUT CONTRAST CT CERVICAL SPINE WITHOUT CONTRAST TECHNIQUE: Multidetector CT imaging of the head and cervical spine was performed following the standard protocol without intravenous contrast. Multiplanar CT image reconstructions of the cervical spine were also generated. COMPARISON:  None. FINDINGS: CT HEAD FINDINGS The bony calvarium is intact. Mild mucosal changes are noted in the left maxillary antrum. Diffuse atrophic changes are seen. No findings to suggest acute hemorrhage, acute infarction or space-occupying mass lesion are noted. CT CERVICAL SPINE FINDINGS Seven cervical segments are well visualized. Vertebral body height is well maintained. Disc space narrowing is noted at C5-6 and C6-7 with associated osteophytic changes. Multilevel facet hypertrophic changes are seen. No acute fracture or acute facet abnormality is noted. The visualized lung apices demonstrate mild scarring bilaterally. A small left pleural effusion is seen. IMPRESSION: CT of the head: Chronic atrophic changes without acute abnormality. CT of the cervical spine: Multilevel degenerative changes without acute bony abnormality. Small left pleural effusion is seen. Electronically Signed   By: Alcide Clever M.D.   On: 12/23/2015 19:02   Ct Angio Chest Pe W And/or Wo Contrast  Result Date: 12/23/2015 CLINICAL DATA:  Recent fall with chest pain and shortness of Breath EXAM: CT ANGIOGRAPHY CHEST WITH CONTRAST TECHNIQUE: Multidetector CT imaging of the chest was performed using the standard protocol during bolus administration of intravenous contrast. Multiplanar CT image reconstructions and MIPs were obtained to  evaluate the vascular anatomy. CONTRAST:  80 mL Isovue 370. COMPARISON:  None.  FINDINGS: Mediastinum/Lymph Nodes: No significant hilar or mediastinal adenopathy is identified. The thoracic inlet is within normal limits. Cardiovascular: Diffuse aortic calcifications are seen without aneurysmal dilatation or dissection. The pulmonary artery shows a normal branching pattern without intraluminal filling defect to suggest pulmonary embolism. Coronary calcifications are noted. Mitral valve replacement is seen Lungs/Pleura: Mild left lower lobe atelectasis is noted with associated small effusion. No pneumothorax is seen. Upper abdomen: No acute abnormality noted. Musculoskeletal: Mildly displaced fractures of the left seventh and eighth ribs Review of the MIP images confirms the above findings. IMPRESSION: No evidence of pulmonary emboli. Fractures of the left seventh and eighth ribs without pneumothorax. Associated left lower lobe atelectasis and effusion is noted. Electronically Signed   By: Alcide Clever M.D.   On: 12/23/2015 19:10   Ct Cervical Spine Wo Contrast  Result Date: 12/23/2015 CLINICAL DATA:  Un witnessed fall with dizziness and neck pain, initial encounter EXAM: CT HEAD WITHOUT CONTRAST CT CERVICAL SPINE WITHOUT CONTRAST TECHNIQUE: Multidetector CT imaging of the head and cervical spine was performed following the standard protocol without intravenous contrast. Multiplanar CT image reconstructions of the cervical spine were also generated. COMPARISON:  None. FINDINGS: CT HEAD FINDINGS The bony calvarium is intact. Mild mucosal changes are noted in the left maxillary antrum. Diffuse atrophic changes are seen. No findings to suggest acute hemorrhage, acute infarction or space-occupying mass lesion are noted. CT CERVICAL SPINE FINDINGS Seven cervical segments are well visualized. Vertebral body height is well maintained. Disc space narrowing is noted at C5-6 and C6-7 with associated osteophytic changes.  Multilevel facet hypertrophic changes are seen. No acute fracture or acute facet abnormality is noted. The visualized lung apices demonstrate mild scarring bilaterally. A small left pleural effusion is seen. IMPRESSION: CT of the head: Chronic atrophic changes without acute abnormality. CT of the cervical spine: Multilevel degenerative changes without acute bony abnormality. Small left pleural effusion is seen. Electronically Signed   By: Alcide Clever M.D.   On: 12/23/2015 19:02    Assessment/Plan 1. Recurrent falls conts to be a fall risk due to orthostatic and weakness. Pt a resident at ALF who will provide max assist. Will dc to ALF with orders for [facility] PT/OT  2. Rib fractures, left, with routine healing, subsequent encounter Pain well controlled, has tramadol PRN  3. Chronic atrial fibrillation (HCC) Rate controlled, no on anticoagulation due to falls   4. Mild episode of recurrent major depressive disorder (HCC) With recent adjustment in medication depression has improved. conts on cymbalta 40 mg daily   5. Chronic obstructive pulmonary disease, unspecified COPD type (HCC) COPD remains stable, cont current regimen   6. Hypothyroidism, unspecified hypothyroidism type Stable on current synthroid dose   7. Essential (primary) hypertension With orthostatic hypotension, to change positions slowly with assistance. Will cont current regimen as she has frequent elevation in blood pressures as well  8. Congestive heart failure, unspecified congestive heart failure chronicity, unspecified congestive heart failure type (HCC) Euvolemic, conts on lasix  pt is stable for discharge back to ALF at the request of family, ALF willing to accept patient. -will need PT/OT per home health. No DME needed. Rx written.  will need to follow up with PCP within 2 weeks.     Janene Harvey. Biagio Borg  [facility] [phone] 8 am - 5 pm) [phone] (after hours)

## 2016-01-25 ENCOUNTER — Encounter (HOSPITAL_COMMUNITY): Payer: Self-pay | Admitting: Nurse Practitioner

## 2016-01-25 ENCOUNTER — Emergency Department (HOSPITAL_COMMUNITY): Payer: Medicare Other

## 2016-01-25 ENCOUNTER — Emergency Department (HOSPITAL_COMMUNITY)
Admission: EM | Admit: 2016-01-25 | Discharge: 2016-01-25 | Disposition: A | Discharge status: Home / Self Care | Payer: Medicare Other | Attending: Emergency Medicine | Admitting: Emergency Medicine

## 2016-01-25 DIAGNOSIS — R0602 Shortness of breath: Secondary | ICD-10-CM | POA: Insufficient documentation

## 2016-01-25 DIAGNOSIS — E039 Hypothyroidism, unspecified: Secondary | ICD-10-CM | POA: Insufficient documentation

## 2016-01-25 DIAGNOSIS — J449 Chronic obstructive pulmonary disease, unspecified: Secondary | ICD-10-CM | POA: Diagnosis not present

## 2016-01-25 DIAGNOSIS — R55 Syncope and collapse: Secondary | ICD-10-CM | POA: Diagnosis present

## 2016-01-25 DIAGNOSIS — I110 Hypertensive heart disease with heart failure: Secondary | ICD-10-CM | POA: Insufficient documentation

## 2016-01-25 DIAGNOSIS — N390 Urinary tract infection, site not specified: Secondary | ICD-10-CM

## 2016-01-25 DIAGNOSIS — E119 Type 2 diabetes mellitus without complications: Secondary | ICD-10-CM | POA: Diagnosis not present

## 2016-01-25 DIAGNOSIS — Z950 Presence of cardiac pacemaker: Secondary | ICD-10-CM | POA: Diagnosis not present

## 2016-01-25 DIAGNOSIS — R079 Chest pain, unspecified: Secondary | ICD-10-CM | POA: Insufficient documentation

## 2016-01-25 DIAGNOSIS — I509 Heart failure, unspecified: Secondary | ICD-10-CM | POA: Diagnosis not present

## 2016-01-25 DIAGNOSIS — Z79899 Other long term (current) drug therapy: Secondary | ICD-10-CM | POA: Diagnosis not present

## 2016-01-25 LAB — URINE MICROSCOPIC-ADD ON

## 2016-01-25 LAB — COMPREHENSIVE METABOLIC PANEL
ALBUMIN: 3.7 g/dL (ref 3.5–5.0)
ALK PHOS: 88 U/L (ref 38–126)
ALT: 13 U/L — AB (ref 14–54)
AST: 15 U/L (ref 15–41)
Anion gap: 8 (ref 5–15)
BILIRUBIN TOTAL: 0.5 mg/dL (ref 0.3–1.2)
BUN: 9 mg/dL (ref 6–20)
CALCIUM: 9.1 mg/dL (ref 8.9–10.3)
CO2: 28 mmol/L (ref 22–32)
CREATININE: 0.81 mg/dL (ref 0.44–1.00)
Chloride: 95 mmol/L — ABNORMAL LOW (ref 101–111)
GFR calc Af Amer: >60 mL/min (ref >60)
GFR calc non Af Amer: >60 mL/min (ref >60)
GLUCOSE: 149 mg/dL — AB (ref 65–99)
POTASSIUM: 4 mmol/L (ref 3.5–5.1)
Sodium: 131 mmol/L — ABNORMAL LOW (ref 135–145)
TOTAL PROTEIN: 6.7 g/dL (ref 6.5–8.1)

## 2016-01-25 LAB — I-STAT TROPONIN, ED: TROPONIN I, POC: 0.02 ng/mL (ref 0.00–0.08)

## 2016-01-25 LAB — URINALYSIS, ROUTINE W REFLEX MICROSCOPIC
Bilirubin Urine: NEGATIVE
Glucose, UA: NEGATIVE mg/dL
Ketones, ur: NEGATIVE mg/dL
NITRITE: POSITIVE — AB
PROTEIN: NEGATIVE mg/dL
Specific Gravity, Urine: 1.01 (ref 1.005–1.030)
pH: 6.5 (ref 5.0–8.0)

## 2016-01-25 LAB — CBC WITH DIFFERENTIAL/PLATELET
BASOS ABS: 0 10*3/uL (ref 0.0–0.1)
BASOS PCT: 0 %
Eosinophils Absolute: 0.2 10*3/uL (ref 0.0–0.7)
Eosinophils Relative: 2 %
HEMATOCRIT: 36.9 % (ref 36.0–46.0)
HEMOGLOBIN: 12.8 g/dL (ref 12.0–15.0)
LYMPHS PCT: 10 %
Lymphs Abs: 0.8 10*3/uL (ref 0.7–4.0)
MCH: 27.6 pg (ref 26.0–34.0)
MCHC: 34.7 g/dL (ref 30.0–36.0)
MCV: 79.7 fL (ref 78.0–100.0)
Monocytes Absolute: 0.6 10*3/uL (ref 0.1–1.0)
Monocytes Relative: 8 %
NEUTROS ABS: 6.1 10*3/uL (ref 1.7–7.7)
NEUTROS PCT: 80 %
Platelets: 225 10*3/uL (ref 150–400)
RBC: 4.63 MIL/uL (ref 3.87–5.11)
RDW: 14 % (ref 11.5–15.5)
WBC: 7.7 10*3/uL (ref 4.0–10.5)

## 2016-01-25 LAB — BRAIN NATRIURETIC PEPTIDE: B NATRIURETIC PEPTIDE 5: 493.6 pg/mL — AB (ref 0.0–100.0)

## 2016-01-25 LAB — LIPASE, BLOOD: Lipase: 24 U/L (ref 11–51)

## 2016-01-25 MED ORDER — CEPHALEXIN 500 MG PO CAPS: 500.0000 mg | ORAL_CAPSULE | Freq: Two times a day (BID) | ORAL | 0 refills | Status: AC

## 2016-01-25 NOTE — ED Notes (Signed)
Unable to collect labs patient is not in the room 

## 2016-01-25 NOTE — ED Notes (Signed)
[facility] called for transport.  

## 2016-01-25 NOTE — ED Notes (Signed)
Pt reports pain in her right leg but is not able to give a straight answer about a pain score.

## 2016-01-25 NOTE — ED Notes (Signed)
Bed: WA11 Expected date:  Expected time:  Means of arrival:  Comments: Ems syncope 

## 2016-01-25 NOTE — ED Notes (Signed)
[facility] here to transport pt back to the NH facility.

## 2016-01-25 NOTE — ED Notes (Signed)
Pt cannot use restroom at this time, aware urine specimen is needed.  

## 2016-01-25 NOTE — ED Provider Notes (Signed)
[facility]-EMERGENCY DEPT Provider Note   CSN: [id number] Arrival date & time: 01/25/16  1204     History   Chief Complaint Chief Complaint  Patient presents with  . Emesis  . Loss of Consciousness  . Diarrhea    HPI Kathy Peterson is a 82 y.o. female.  HPI Kathy Peterson is a 82 y.o. female with PMH significant for AF, CHF, COPD, DM, GERD, HTN who presents with N/V/D since today.  Patient is a poor historian.  BIB EMS for syncopal episode while on the toilet.  She was assisted to the floor without sustaining head injury.  Associated symptoms include chest pain and shortness of breath.  Denies fever, abdominal pain, or urinary symptoms.  Symptoms are intermittent and mild.   Past Medical History:  Diagnosis Date  . Arthritis    rheumatoid  . Atrial fibrillation (HCC)   . CHF (congestive heart failure) (HCC)   . COPD (chronic obstructive pulmonary disease) (HCC)   . Depression   . Diabetes mellitus without complication (HCC)    type 2  . GERD (gastroesophageal reflux disease)   . Hypertension   . Pacemaker   . Pneumonia   . Polio   . Shortness of breath   . Thyroid disease    hypothyroid    Patient Active Problem List   Diagnosis Date Noted  . Episode of recurrent major depressive disorder (HCC)   . FTT (failure to thrive) in adult   . Hyponatremia 12/24/2015  . Rib fractures 12/23/2015  . Atrial fibrillation (HCC) 12/23/2015  . CHF (congestive heart failure) (HCC) 12/23/2015  . COPD (chronic obstructive pulmonary disease) (HCC) 12/23/2015  . Pre-diabetes 12/23/2015  . Essential (primary) hypertension 12/23/2015  . Hypothyroidism 12/23/2015  . Recurrent falls 12/23/2015    Past Surgical History:  Procedure Laterality Date  . ABDOMINAL HYSTERECTOMY    . CHOLECYSTECTOMY    . EYE SURGERY      OB History    Gravida Para Term Preterm AB Living   2 2 2     2    SAB TAB Ectopic Multiple Live Births           2       Home Medications    Prior to Admission  medications   Medication Sig Start Date End Date Taking? Authorizing Provider  acetaminophen (TYLENOL) 500 MG tablet Take 500 mg by mouth 3 (three) times daily.    Yes Historical Provider, MD  albuterol (PROVENTIL HFA;VENTOLIN HFA) 108 (90 Base) MCG/ACT inhaler Inhale 2 puffs into the lungs every 6 (six) hours as needed for wheezing or shortness of breath.   Yes Historical Provider, MD  Calcium Carb-Cholecalciferol (CALCIUM 600+D) 600-800 MG-UNIT TABS Take 1 tablet by mouth daily.   Yes Historical Provider, MD  Carboxymethylcell-Glycerin PF (OPTIVE SENSITIVE) 0.5-0.9 % SOLN Place 1 drop into both eyes every 12 (twelve) hours.   Yes Historical Provider, MD  diltiazem (DILACOR XR) 120 MG 24 hr capsule Take 120 mg by mouth at bedtime. Hold if pulse is less than 55 or if systolic less than 100.   Yes Historical Provider, MD  docusate sodium (COLACE) 100 MG capsule Take 1 capsule (100 mg total) by mouth 2 (two) times daily. 12/27/15  Yes Nishant Dhungel, MD  DULoxetine (CYMBALTA) 20 MG capsule Take 40 mg by mouth daily.   Yes Historical Provider, MD  fluticasone-salmeterol (ADVAIR HFA) 115-21 MCG/ACT inhaler Inhale 2 puffs into the lungs 2 (two) times daily.   Yes Historical Provider, MD  furosemide (LASIX) 20 MG tablet Take 10 mg by mouth daily.   Yes Historical Provider, MD  ipratropium (ATROVENT) 0.02 % nebulizer solution Take 0.5 mg by nebulization every 6 (six) hours as needed for wheezing or shortness of breath.   Yes Historical Provider, MD  levothyroxine (SYNTHROID, LEVOTHROID) 100 MCG tablet Take 100 mcg by mouth daily before breakfast.   Yes Historical Provider, MD  loratadine (CLARITIN) 10 MG tablet Take 10 mg by mouth daily.   Yes Historical Provider, MD  losartan (COZAAR) 25 MG tablet Take 25 mg by mouth daily.   Yes Historical Provider, MD  Melatonin 3 MG TABS Take 3 mg by mouth at bedtime.   Yes Historical Provider, MD  Menthol, Topical Analgesic, (BIOFREEZE) 4 % GEL Apply topically 3  (three) times daily. Patient applies to left side and back   Yes Historical Provider, MD  Menthol, Topical Analgesic, (BIOFREEZE) 4 % GEL Apply 1 application topically 3 (three) times daily. To right knee x 2 weeks. Start date 01/22/16 end date 02/05/16   Yes Historical Provider, MD  pantoprazole (PROTONIX) 20 MG tablet Take 20 mg by mouth daily.   Yes Historical Provider, MD  psyllium (REGULOID) 0.52 g capsule Take 0.52 g by mouth at bedtime.   Yes Historical Provider, MD  traMADol (ULTRAM) 50 MG tablet Give 1/2 to 1 tablet by mouth every 8 hours as needed for pain that is uncontrolled by tylenol.   Yes Historical Provider, MD  traZODone (DESYREL) 50 MG tablet Take 25 mg by mouth at bedtime as needed for sleep.    Yes Historical Provider, MD  cephALEXin (KEFLEX) 500 MG capsule Take 1 capsule (500 mg total) by mouth 2 (two) times daily. 01/25/16 02/01/16  Cheri Fowler, PA-C    Family History Family History  Problem Relation Age of Onset  . Heart disease Mother   . Stroke Neg Hx   . Diabetes Neg Hx   . Cancer Neg Hx     Social History Social History  Substance Use Topics  . Smoking status: Never Smoker  . Smokeless tobacco: Never Used  . Alcohol use No     Allergies   Penicillins; Pneumovax [pneumococcal polysaccharide vaccine]; Statins; and Sulfa antibiotics   Review of Systems Review of Systems All other systems negative unless otherwise stated in HPI   Physical Exam Updated Vital Signs BP 132/72 (BP Location: Right Arm)   Pulse 99   Temp 97.8 F (36.6 C) (Oral)   Resp 18   Ht 5\' 6"  (1.676 m)   Wt 70.8 kg   LMP  (LMP Unknown)   SpO2 97%   BMI 25.18 kg/m   Physical Exam  Constitutional: She is oriented to person, place, and time. She appears well-developed and well-nourished.  Non-toxic appearance. She does not have a sickly appearance. She does not appear ill.  HENT:  Head: Normocephalic and atraumatic.  Mouth/Throat: Oropharynx is clear and moist.  Eyes: Conjunctivae  are normal. Pupils are equal, round, and reactive to light.  Neck: Normal range of motion. Neck supple.  Cardiovascular: Normal rate and regular rhythm.   Pulmonary/Chest: Effort normal. No accessory muscle usage or stridor. No respiratory distress. She has no wheezes. She has no rhonchi. She has no rales.  Diminished at the bases.   Abdominal: Soft. Bowel sounds are normal. She exhibits no distension. There is no tenderness. There is no rebound and no guarding.  Musculoskeletal: Normal range of motion.  Lymphadenopathy:    She has no cervical  adenopathy.  Neurological: She is alert and oriented to person, place, and time.  Speech clear without dysarthria.  Skin: Skin is warm and dry.  Psychiatric: She has a normal mood and affect. Her behavior is normal.     ED Treatments / Results  Labs (all labs ordered are listed, but only abnormal results are displayed) Labs Reviewed  COMPREHENSIVE METABOLIC PANEL - Abnormal; Notable for the following:       Result Value   Sodium 131 (*)    Chloride 95 (*)    Glucose, Bld 149 (*)    ALT 13 (*)    All other components within normal limits  BRAIN NATRIURETIC PEPTIDE - Abnormal; Notable for the following:    B Natriuretic Peptide 493.6 (*)    All other components within normal limits  URINALYSIS, ROUTINE W REFLEX MICROSCOPIC (NOT AT [facility]) - Abnormal; Notable for the following:    APPearance CLOUDY (*)    Hgb urine dipstick MODERATE (*)    Nitrite POSITIVE (*)    Leukocytes, UA LARGE (*)    All other components within normal limits  URINE MICROSCOPIC-ADD ON - Abnormal; Notable for the following:    Squamous Epithelial / LPF 6-30 (*)    Bacteria, UA MANY (*)    All other components within normal limits  URINE CULTURE  CBC WITH DIFFERENTIAL/PLATELET  LIPASE, BLOOD  I-STAT TROPOININ, ED    EKG  EKG Interpretation  Date/Time:  Friday January 25 2016 12:18:36 EDT Ventricular Rate:  78 PR Interval:    QRS Duration: 160 QT  Interval:  489 QTC Calculation: 558 R Axis:   -58 Text Interpretation:  Atrial-sensed ventricular-paced complexes No further analysis attempted due to paced rhythm No significant change since last tracing Confirmed by KNAPP  MD-J, JON ([id number]) on 01/25/2016 4:02:10 PM       Radiology Dg Chest 2 View  Result Date: 01/25/2016 CLINICAL DATA:  Syncope.  History CHF.  History diabetes. EXAM: CHEST  2 VIEW COMPARISON:  CT chest 12/23/2015 FINDINGS: There is mild bilateral interstitial thickening. There is no focal parenchymal opacity. There is no pleural effusion or pneumothorax. The heart and mediastinal contours are unremarkable. There is a dual lead AICD. The osseous structures are unremarkable. IMPRESSION: No active cardiopulmonary disease. Electronically Signed   By: Elige Ko   On: 01/25/2016 13:12    Procedures Procedures (including critical care time)  Medications Ordered in ED Medications - No data to display   Initial Impression / Assessment and Plan / ED Course  I have reviewed the triage vital signs and the nursing notes.  Pertinent labs & imaging results that were available during my care of the patient were reviewed by me and considered in my medical decision making (see chart for details).  Clinical Course  Comment By Time  Seen and examined.  No vomiting since she has been here.  Will try oral challenge Linwood Dibbles, MD 09/08 [phone]   CTA chest 8/6 negative for PE.  Patient presents with N/V/D and possible syncopal episode.  VSS, NAD.  Patient appears well.  Heart RRR, lungs CTAB, abdomen soft and benign.  Lab work without acute abnormalities, reassuring.  EKG is not acute.  CXR unremarkable. Chest CTA negative 8/6. BNP mildly elevated, but no signs of volume overload.  Syncope likely vagal in nature.  UA appears infectious with positive nitrite, many bacteria, and large leuks.  Sent for culture.  Patient has had Keflex before without difficulty.  Patient able to tolerate  PO  without difficulty.  Return precautions discussed.  Stable for discharge.   Case has been discussed with and seen by Dr. Lynelle Doctor who agrees with the above plan for discharge.  Final Clinical Impressions(s) / ED Diagnoses   Final diagnoses:  UTI (lower urinary tract infection)    New Prescriptions New Prescriptions   CEPHALEXIN (KEFLEX) 500 MG CAPSULE    Take 1 capsule (500 mg total) by mouth 2 (two) times daily.     Cheri Fowler, PA-C 01/25/16 1732    Linwood Dibbles, MD 01/27/16 1600

## 2016-01-25 NOTE — ED Triage Notes (Signed)
Pt on the toilet vagaled down passed out. Family laid her down and she woke right back up, and then had diarrhea.

## 2016-01-25 NOTE — ED Notes (Signed)
Pt given water to drink and some crackers to eat.

## 2016-01-26 LAB — URINE CULTURE

## 2016-04-07 ENCOUNTER — Inpatient Hospital Stay (HOSPITAL_COMMUNITY)
Admission: EM | Admit: 2016-04-07 | Discharge: 2016-04-12 | DRG: 191 | Disposition: A | Discharge status: Home / Self Care | Payer: Medicare Other | Attending: Internal Medicine | Admitting: Internal Medicine

## 2016-04-07 ENCOUNTER — Emergency Department (HOSPITAL_COMMUNITY): Payer: Medicare Other

## 2016-04-07 ENCOUNTER — Encounter (HOSPITAL_COMMUNITY): Payer: Self-pay | Admitting: Emergency Medicine

## 2016-04-07 DIAGNOSIS — I10 Essential (primary) hypertension: Secondary | ICD-10-CM | POA: Diagnosis present

## 2016-04-07 DIAGNOSIS — Z950 Presence of cardiac pacemaker: Secondary | ICD-10-CM

## 2016-04-07 DIAGNOSIS — I509 Heart failure, unspecified: Secondary | ICD-10-CM

## 2016-04-07 DIAGNOSIS — J441 Chronic obstructive pulmonary disease with (acute) exacerbation: Secondary | ICD-10-CM | POA: Diagnosis not present

## 2016-04-07 DIAGNOSIS — N3001 Acute cystitis with hematuria: Secondary | ICD-10-CM | POA: Diagnosis present

## 2016-04-07 DIAGNOSIS — E039 Hypothyroidism, unspecified: Secondary | ICD-10-CM | POA: Diagnosis present

## 2016-04-07 DIAGNOSIS — Z79899 Other long term (current) drug therapy: Secondary | ICD-10-CM

## 2016-04-07 DIAGNOSIS — F332 Major depressive disorder, recurrent severe without psychotic features: Secondary | ICD-10-CM | POA: Diagnosis not present

## 2016-04-07 DIAGNOSIS — R627 Adult failure to thrive: Secondary | ICD-10-CM | POA: Diagnosis present

## 2016-04-07 DIAGNOSIS — Z8249 Family history of ischemic heart disease and other diseases of the circulatory system: Secondary | ICD-10-CM

## 2016-04-07 DIAGNOSIS — R296 Repeated falls: Secondary | ICD-10-CM

## 2016-04-07 DIAGNOSIS — Z880 Allergy status to penicillin: Secondary | ICD-10-CM | POA: Diagnosis not present

## 2016-04-07 DIAGNOSIS — I110 Hypertensive heart disease with heart failure: Secondary | ICD-10-CM | POA: Diagnosis present

## 2016-04-07 DIAGNOSIS — J029 Acute pharyngitis, unspecified: Secondary | ICD-10-CM | POA: Diagnosis present

## 2016-04-07 DIAGNOSIS — H9190 Unspecified hearing loss, unspecified ear: Secondary | ICD-10-CM | POA: Diagnosis present

## 2016-04-07 DIAGNOSIS — F32A Depression, unspecified: Secondary | ICD-10-CM

## 2016-04-07 DIAGNOSIS — J45901 Unspecified asthma with (acute) exacerbation: Secondary | ICD-10-CM | POA: Diagnosis present

## 2016-04-07 DIAGNOSIS — K219 Gastro-esophageal reflux disease without esophagitis: Secondary | ICD-10-CM | POA: Diagnosis present

## 2016-04-07 DIAGNOSIS — I480 Paroxysmal atrial fibrillation: Secondary | ICD-10-CM | POA: Diagnosis present

## 2016-04-07 DIAGNOSIS — R45851 Suicidal ideations: Secondary | ICD-10-CM | POA: Diagnosis present

## 2016-04-07 DIAGNOSIS — Z888 Allergy status to other drugs, medicaments and biological substances status: Secondary | ICD-10-CM | POA: Diagnosis not present

## 2016-04-07 DIAGNOSIS — I4581 Long QT syndrome: Secondary | ICD-10-CM | POA: Diagnosis present

## 2016-04-07 DIAGNOSIS — I4891 Unspecified atrial fibrillation: Secondary | ICD-10-CM | POA: Diagnosis present

## 2016-04-07 DIAGNOSIS — F329 Major depressive disorder, single episode, unspecified: Secondary | ICD-10-CM

## 2016-04-07 DIAGNOSIS — E119 Type 2 diabetes mellitus without complications: Secondary | ICD-10-CM | POA: Diagnosis present

## 2016-04-07 DIAGNOSIS — E871 Hypo-osmolality and hyponatremia: Secondary | ICD-10-CM | POA: Diagnosis present

## 2016-04-07 DIAGNOSIS — R9431 Abnormal electrocardiogram [ECG] [EKG]: Secondary | ICD-10-CM | POA: Diagnosis present

## 2016-04-07 HISTORY — DX: Adult failure to thrive: R62.7

## 2016-04-07 HISTORY — DX: Contact with and (suspected) exposure to environmental tobacco smoke (acute) (chronic): Z77.22

## 2016-04-07 HISTORY — DX: Orthostatic hypotension: I95.1

## 2016-04-07 HISTORY — DX: Repeated falls: R29.6

## 2016-04-07 HISTORY — DX: Paroxysmal atrial fibrillation: I48.0

## 2016-04-07 HISTORY — DX: Hypo-osmolality and hyponatremia: E87.1

## 2016-04-07 LAB — URINALYSIS, ROUTINE W REFLEX MICROSCOPIC
BILIRUBIN URINE: NEGATIVE
Glucose, UA: NEGATIVE mg/dL
Ketones, ur: NEGATIVE mg/dL
NITRITE: NEGATIVE
PH: 6.5 (ref 5.0–8.0)
Protein, ur: NEGATIVE mg/dL
SPECIFIC GRAVITY, URINE: 1.011 (ref 1.005–1.030)

## 2016-04-07 LAB — RAPID URINE DRUG SCREEN, HOSP PERFORMED
AMPHETAMINES: NOT DETECTED
BENZODIAZEPINES: NOT DETECTED
Barbiturates: NOT DETECTED
COCAINE: NOT DETECTED
OPIATES: NOT DETECTED
Tetrahydrocannabinol: NOT DETECTED

## 2016-04-07 LAB — COMPREHENSIVE METABOLIC PANEL
ALK PHOS: 71 U/L (ref 38–126)
ALT: 11 U/L — ABNORMAL LOW (ref 14–54)
ANION GAP: 7 (ref 5–15)
AST: 15 U/L (ref 15–41)
Albumin: 3.8 g/dL (ref 3.5–5.0)
BUN: 11 mg/dL (ref 6–20)
CALCIUM: 9 mg/dL (ref 8.9–10.3)
CO2: 25 mmol/L (ref 22–32)
CREATININE: 0.59 mg/dL (ref 0.44–1.00)
Chloride: 97 mmol/L — ABNORMAL LOW (ref 101–111)
Glucose, Bld: 109 mg/dL — ABNORMAL HIGH (ref 65–99)
Potassium: 4 mmol/L (ref 3.5–5.1)
Sodium: 129 mmol/L — ABNORMAL LOW (ref 135–145)
Total Bilirubin: 0.5 mg/dL (ref 0.3–1.2)
Total Protein: 6.2 g/dL — ABNORMAL LOW (ref 6.5–8.1)

## 2016-04-07 LAB — CBC WITH DIFFERENTIAL/PLATELET
BASOS ABS: 0 10*3/uL (ref 0.0–0.1)
BASOS PCT: 1 %
EOS ABS: 0.2 10*3/uL (ref 0.0–0.7)
EOS PCT: 4 %
HCT: 37.7 % (ref 36.0–46.0)
Hemoglobin: 13 g/dL (ref 12.0–15.0)
Lymphocytes Relative: 29 %
Lymphs Abs: 1.9 10*3/uL (ref 0.7–4.0)
MCH: 28.4 pg (ref 26.0–34.0)
MCHC: 34.5 g/dL (ref 30.0–36.0)
MCV: 82.5 fL (ref 78.0–100.0)
Monocytes Absolute: 0.7 10*3/uL (ref 0.1–1.0)
Monocytes Relative: 11 %
Neutro Abs: 3.5 10*3/uL (ref 1.7–7.7)
Neutrophils Relative %: 55 %
PLATELETS: 263 10*3/uL (ref 150–400)
RBC: 4.57 MIL/uL (ref 3.87–5.11)
RDW: 14 % (ref 11.5–15.5)
WBC: 6.4 10*3/uL (ref 4.0–10.5)

## 2016-04-07 LAB — URINE MICROSCOPIC-ADD ON
RBC / HPF: NONE SEEN RBC/hpf (ref 0–5)
Squamous Epithelial / LPF: NONE SEEN

## 2016-04-07 LAB — ETHANOL

## 2016-04-07 LAB — TSH: TSH: 4.546 u[IU]/mL — ABNORMAL HIGH (ref 0.350–4.500)

## 2016-04-07 MED ORDER — TRAZODONE HCL 50 MG PO TABS
25.0000 mg | ORAL_TABLET | ORAL | Status: DC
  Administered 2016-04-07 – 2016-04-08 (×2): 25 mg via ORAL
  Filled 2016-04-07 (×2): qty 1

## 2016-04-07 MED ORDER — LOSARTAN POTASSIUM 50 MG PO TABS
25.0000 mg | ORAL_TABLET | Freq: Every day | ORAL | Status: DC
  Administered 2016-04-08 – 2016-04-11 (×4): 25 mg via ORAL
  Filled 2016-04-07 (×4): qty 1

## 2016-04-07 MED ORDER — DULOXETINE HCL 20 MG PO CPEP
20.0000 mg | ORAL_CAPSULE | Freq: Every day | ORAL | Status: DC
  Administered 2016-04-08 – 2016-04-11 (×4): 20 mg via ORAL
  Filled 2016-04-07 (×5): qty 1

## 2016-04-07 MED ORDER — LEVOTHYROXINE SODIUM 75 MCG PO TABS
75.0000 ug | ORAL_TABLET | Freq: Every day | ORAL | Status: DC
  Administered 2016-04-08 – 2016-04-09 (×2): 75 ug via ORAL
  Filled 2016-04-07 (×2): qty 1

## 2016-04-07 MED ORDER — TRAMADOL HCL 50 MG PO TABS
25.0000 mg | ORAL_TABLET | Freq: Three times a day (TID) | ORAL | Status: DC | PRN
  Administered 2016-04-08 – 2016-04-11 (×3): 25 mg via ORAL
  Filled 2016-04-07 (×3): qty 1

## 2016-04-07 MED ORDER — DILTIAZEM HCL ER 120 MG PO CP24
120.0000 mg | ORAL_CAPSULE | Freq: Every day | ORAL | Status: DC
  Administered 2016-04-07 – 2016-04-11 (×5): 120 mg via ORAL
  Filled 2016-04-07 (×5): qty 1

## 2016-04-07 MED ORDER — FUROSEMIDE 20 MG PO TABS
10.0000 mg | ORAL_TABLET | Freq: Every day | ORAL | Status: DC
  Administered 2016-04-08 – 2016-04-09 (×2): 10 mg via ORAL
  Filled 2016-04-07: qty 1
  Filled 2016-04-07: qty 0.5

## 2016-04-07 MED ORDER — CEPHALEXIN 250 MG PO CAPS
250.0000 mg | ORAL_CAPSULE | Freq: Two times a day (BID) | ORAL | Status: AC
  Administered 2016-04-07 – 2016-04-11 (×10): 250 mg via ORAL
  Filled 2016-04-07 (×10): qty 1

## 2016-04-07 MED ORDER — ACETAMINOPHEN 500 MG PO TABS
500.0000 mg | ORAL_TABLET | Freq: Two times a day (BID) | ORAL | Status: DC
  Administered 2016-04-07 – 2016-04-11 (×9): 500 mg via ORAL
  Filled 2016-04-07 (×9): qty 1

## 2016-04-07 MED ORDER — ALBUTEROL SULFATE HFA 108 (90 BASE) MCG/ACT IN AERS
2.0000 | INHALATION_SPRAY | Freq: Four times a day (QID) | RESPIRATORY_TRACT | Status: DC | PRN
  Administered 2016-04-08: 2 via RESPIRATORY_TRACT
  Filled 2016-04-07: qty 6.7

## 2016-04-07 NOTE — ED Provider Notes (Signed)
EKG Interpretation #2  Date/Time:  Monday April 07 2016 22:15:01 EST Ventricular Rate:  86 PR Interval:    QRS Duration: 162 QT Interval:  505 QTC Calculation: 671 R Axis:   111 Text Interpretation:  Atrial-sensed ventricular-paced complexes No further rhythm analysis attempted due to paced rhythm IVCD, consider atypical RBBB Nonspecific ST depression Baseline wander in lead(s) I aVR No significant change was found Confirmed by Kaydance Bowie  MD, Shantinique Picazo ([id number]) on 04/07/2016 10:53:51 PM         Azalia Bilis, MD 04/07/16 [phone]

## 2016-04-07 NOTE — ED Notes (Signed)
Patient dressed out in paper scrubs. Patient and belongings wanded by security.

## 2016-04-07 NOTE — [facility] Counselor (Addendum)
Kathy Freeze, FNP recommends INPT [facility] placement. Patient referred to [place], [place], [place], and [facility]. Patient's TSH, EKG, and chest xray will be ordered by EDP-Dr. Dessa Phi. Once resulted the information will need to be faxed to [facility].

## 2016-04-07 NOTE — ED Provider Notes (Signed)
[facility]-EMERGENCY DEPT Provider Note   CSN: [id number] Arrival date & time: 04/07/16  1326     History   Chief Complaint Chief Complaint  Patient presents with  . Urinary Frequency  . Psychiatric Evaluation    HPI Kathy Peterson is a 82 y.o. female.  The history is provided by the patient.  Dysuria   This is a new problem. The current episode started more than 1 week ago. The problem occurs every urination. The problem has not changed since onset.The pain is moderate. There has been no fever. She is not sexually active. Associated symptoms include frequency. Pertinent negatives include no chills, no nausea, no vomiting, no discharge, no hematuria and no flank pain.  Mental Health Problem  Presenting symptoms: suicidal thoughts   Degree of incapacity (severity):  Moderate Onset quality:  Gradual Duration: several months. Timing:  Intermittent Progression:  Waxing and waning Chronicity:  Recurrent Associated symptoms: no abdominal pain and no chest pain     Past Medical History:  Diagnosis Date  . Arthritis    rheumatoid  . Atrial fibrillation (HCC)   . CHF (congestive heart failure) (HCC)   . COPD (chronic obstructive pulmonary disease) (HCC)   . Depression   . Diabetes mellitus without complication (HCC)    type 2  . GERD (gastroesophageal reflux disease)   . Hypertension   . Pacemaker   . Pneumonia   . Polio   . Shortness of breath   . Thyroid disease    hypothyroid    Patient Active Problem List   Diagnosis Date Noted  . Episode of recurrent major depressive disorder (HCC)   . FTT (failure to thrive) in adult   . Hyponatremia 12/24/2015  . Rib fractures 12/23/2015  . Atrial fibrillation (HCC) 12/23/2015  . CHF (congestive heart failure) (HCC) 12/23/2015  . COPD (chronic obstructive pulmonary disease) (HCC) 12/23/2015  . Pre-diabetes 12/23/2015  . Essential (primary) hypertension 12/23/2015  . Hypothyroidism 12/23/2015  . Recurrent falls 12/23/2015     Past Surgical History:  Procedure Laterality Date  . ABDOMINAL HYSTERECTOMY    . CHOLECYSTECTOMY    . EYE SURGERY      OB History    Gravida Para Term Preterm AB Living   2 2 2     2    SAB TAB Ectopic Multiple Live Births           2       Home Medications    Prior to Admission medications   Medication Sig Start Date End Date Taking? Authorizing Provider  acetaminophen (TYLENOL) 500 MG tablet Take 500 mg by mouth 2 (two) times daily.    Yes Historical Provider, MD  albuterol (PROVENTIL HFA;VENTOLIN HFA) 108 (90 Base) MCG/ACT inhaler Inhale 2 puffs into the lungs every 6 (six) hours as needed for wheezing or shortness of breath.   Yes Historical Provider, MD  Calcium Carb-Cholecalciferol (CALCIUM 600+D) 600-800 MG-UNIT TABS Take 1 tablet by mouth daily.   Yes Historical Provider, MD  Carboxymethylcell-Glycerin PF (OPTIVE SENSITIVE) 0.5-0.9 % SOLN Place 1 drop into both eyes every 12 (twelve) hours.   Yes Historical Provider, MD  diltiazem (DILACOR XR) 120 MG 24 hr capsule Take 120 mg by mouth at bedtime. Hold if pulse is less than 55 or if systolic less than 100.   Yes Historical Provider, MD  docusate sodium (COLACE) 100 MG capsule Take 1 capsule (100 mg total) by mouth 2 (two) times daily. Patient taking differently: Take 100 mg by mouth every  Monday, Wednesday, and Friday.  12/27/15  Yes Nishant Dhungel, MD  DULoxetine (CYMBALTA) 20 MG capsule Take 20 mg by mouth daily.   Yes Historical Provider, MD  fluticasone-salmeterol (ADVAIR HFA) 115-21 MCG/ACT inhaler Inhale 2 puffs into the lungs 2 (two) times daily.   Yes Historical Provider, MD  furosemide (LASIX) 20 MG tablet Take 10 mg by mouth daily.   Yes Historical Provider, MD  ipratropium (ATROVENT) 0.02 % nebulizer solution Take 0.5 mg by nebulization every 6 (six) hours as needed for wheezing or shortness of breath.   Yes Historical Provider, MD  levothyroxine (SYNTHROID, LEVOTHROID) 75 MCG tablet Take 75 mcg by mouth daily  before breakfast.   Yes Historical Provider, MD  loperamide (IMODIUM A-D) 2 MG tablet Take 2 mg by mouth every 6 (six) hours as needed for diarrhea or loose stools.   Yes Historical Provider, MD  loratadine (CLARITIN) 10 MG tablet Take 10 mg by mouth daily.   Yes Historical Provider, MD  losartan (COZAAR) 25 MG tablet Take 25 mg by mouth daily.   Yes Historical Provider, MD  Melatonin 3 MG TABS Take 3 mg by mouth at bedtime.   Yes Historical Provider, MD  Menthol, Topical Analgesic, (BIOFREEZE) 4 % GEL Apply topically 3 (three) times daily. Patient applies to left side and back   Yes Historical Provider, MD  Menthol, Topical Analgesic, (BIOFREEZE) 4 % GEL Apply 1 application topically 3 (three) times daily. To right knee x 2 weeks. Start date 01/22/16 end date 02/05/16   Yes Historical Provider, MD  pantoprazole (PROTONIX) 20 MG tablet Take 20 mg by mouth daily.   Yes Historical Provider, MD  psyllium (REGULOID) 0.52 g capsule Take 0.52 g by mouth at bedtime.   Yes Historical Provider, MD  traMADol (ULTRAM) 50 MG tablet Take 25 mg by mouth every 8 (eight) hours as needed (pain). Give 1/2 to 1 tablet by mouth every 8 hours as needed for pain that is uncontrolled by tylenol.    Yes Historical Provider, MD  traZODone (DESYREL) 50 MG tablet Take 25 mg by mouth daily. As needed For depression   Yes Historical Provider, MD    Family History Family History  Problem Relation Age of Onset  . Heart disease Mother   . Stroke Neg Hx   . Diabetes Neg Hx   . Cancer Neg Hx     Social History Social History  Substance Use Topics  . Smoking status: Never Smoker  . Smokeless tobacco: Never Used  . Alcohol use No     Allergies   Penicillins; Pneumovax [pneumococcal polysaccharide vaccine]; Statins; and Sulfa antibiotics   Review of Systems Review of Systems  Constitutional: Negative for chills and fever.  HENT: Negative for ear pain and sore throat.   Eyes: Negative for pain and visual disturbance.   Respiratory: Negative for cough and shortness of breath.   Cardiovascular: Negative for chest pain and palpitations.  Gastrointestinal: Negative for abdominal pain, nausea and vomiting.  Genitourinary: Positive for dysuria and frequency. Negative for flank pain and hematuria.  Musculoskeletal: Negative for arthralgias and back pain.  Skin: Negative for color change and rash.  Neurological: Positive for weakness (RLE (baseline from polio)). Negative for seizures and syncope.  Psychiatric/Behavioral: Positive for suicidal ideas.  All other systems reviewed and are negative.    Physical Exam Updated Vital Signs BP 167/93   Pulse 81   Temp 98.3 F (36.8 C) (Oral)   Resp 18   Ht 5\' 6"  (1.676  m)   LMP  (LMP Unknown)   SpO2 97%   Physical Exam  Constitutional: She is oriented to person, place, and time. She appears well-developed and well-nourished. No distress.  HENT:  Head: Normocephalic and atraumatic.  Nose: Nose normal.  Eyes: Conjunctivae and EOM are normal. Pupils are equal, round, and reactive to light. Right eye exhibits no discharge. Left eye exhibits no discharge. No scleral icterus.  Neck: Normal range of motion. Neck supple.  Cardiovascular: Normal rate and regular rhythm.  Exam reveals no gallop and no friction rub.   No murmur heard. Pulmonary/Chest: Effort normal and breath sounds normal. No stridor. No respiratory distress. She has no rales.  Abdominal: Soft. She exhibits no distension. There is no tenderness.  Musculoskeletal: She exhibits no edema or tenderness.  Neurological: She is alert and oriented to person, place, and time.  Skin: Skin is warm and dry. No rash noted. She is not diaphoretic. No erythema.  Psychiatric: She has a normal mood and affect.  Vitals reviewed.    ED Treatments / Results  Labs (all labs ordered are listed, but only abnormal results are displayed) Labs Reviewed  COMPREHENSIVE METABOLIC PANEL - Abnormal; Notable for the  following:       Result Value   Sodium 129 (*)    Chloride 97 (*)    Glucose, Bld 109 (*)    Total Protein 6.2 (*)    ALT 11 (*)    All other components within normal limits  URINALYSIS, ROUTINE W REFLEX MICROSCOPIC (NOT AT [facility]) - Abnormal; Notable for the following:    APPearance CLOUDY (*)    Hgb urine dipstick TRACE (*)    Leukocytes, UA MODERATE (*)    All other components within normal limits  URINE MICROSCOPIC-ADD ON - Abnormal; Notable for the following:    Bacteria, UA MANY (*)    All other components within normal limits  URINE CULTURE  ETHANOL  CBC WITH DIFFERENTIAL/PLATELET  RAPID URINE DRUG SCREEN, HOSP PERFORMED    EKG  EKG Interpretation  Date/Time:  Monday April 07 2016 14:27:43 EST Ventricular Rate:  84 PR Interval:    QRS Duration: 167 QT Interval:  486 QTC Calculation: 536 R Axis:   -68 Text Interpretation:  Atrial-sensed ventricular-paced complexes No further analysis attempted due to paced rhythm Baseline wander in lead(s) I aVR No significant change since last tracing Confirmed by [facility] MD, PEDRO ([id number]) on 04/07/2016 3:33:26 PM       Radiology No results found.  Procedures Procedures (including critical care time)  Medications Ordered in ED Medications  cephALEXin (KEFLEX) capsule 250 mg (not administered)  acetaminophen (TYLENOL) tablet 500 mg (not administered)  albuterol (PROVENTIL HFA;VENTOLIN HFA) 108 (90 Base) MCG/ACT inhaler 2 puff (not administered)  diltiazem (DILACOR XR) 24 hr capsule 120 mg (not administered)  DULoxetine (CYMBALTA) DR capsule 20 mg (not administered)  furosemide (LASIX) tablet 10 mg (not administered)  levothyroxine (SYNTHROID, LEVOTHROID) tablet 75 mcg (not administered)  losartan (COZAAR) tablet 25 mg (not administered)  traMADol (ULTRAM) tablet 25 mg (not administered)  traZODone (DESYREL) tablet 25 mg (not administered)     Initial Impression / Assessment and Plan / ED Course  I have reviewed the  triage vital signs and the nursing notes.  Pertinent labs & imaging results that were available during my care of the patient were reviewed by me and considered in my medical decision making (see chart for details).  Clinical Course     Workup with evidence of  urinary tract infection. We'll send cultures. Patient allergic to penicillin however has received Ancef was points in the past. We'll treat with Keflex. Patient is medical cleared for behavioral health evaluation for suicidal ideation/depression.  Final Clinical Impressions(s) / ED Diagnoses   Final diagnoses:  Acute cystitis with hematuria  Suicidal ideation  Depression, unspecified depression type      Nira Conn, MD 04/07/16 1544

## 2016-04-07 NOTE — BH Assessment (Addendum)
Assessment Note  Kathy Peterson is an 82 y.o. female that presents this date with thoughts of self harm. Patient stated she wanted to "fall out of the bed and break her neck." Patient does not seem to process the content of this writer's questions. This writer attempted to ask patient each question several times with limited response. Patient did answer "yes" when asked if patient was here voluntarily. Patient admits to being hearing impaired. Information for the assessment was gathered from admission notes. Admission note stated: "Per EMS, pt from [facility], per staff, patient stated she "wanted to fall and break her neck and die." Pt also reports urinary frequency and burning with urination. 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 A&Ox3. Patient anxious and tearful upon assessment. Pt reports SI without plan, stating "I would find a way." Hx of depression." Case was staffed with Link Snuffer FNP who recommended a Gero-Psych admission as appropriate bed placement is investigated.  Diagnosis: MDD without psychotic features, severe  Past Medical History:  Past Medical History:  Diagnosis Date  . Arthritis    rheumatoid  . Atrial fibrillation (HCC)   . CHF (congestive heart failure) (HCC)   . COPD (chronic obstructive pulmonary disease) (HCC)   . Depression   . Diabetes mellitus without complication (HCC)    type 2  . GERD (gastroesophageal reflux disease)   . Hypertension   . Pacemaker   . Pneumonia   . Polio   . Shortness of breath   . Thyroid disease    hypothyroid    Past Surgical History:  Procedure Laterality Date  . ABDOMINAL HYSTERECTOMY    . CHOLECYSTECTOMY    . EYE SURGERY      Family History:  Family History  Problem Relation Age of Onset  . Heart disease Mother   . Stroke Neg Hx   . Diabetes Neg Hx   . Cancer Neg Hx     Social History:  reports that she has never smoked. She has never used smokeless tobacco. She reports that she does not drink alcohol or use drugs.  Additional  Social History:  Alcohol / Drug Use Pain Medications: See MAR Prescriptions: See MAR Over the Counter: See MAR History of alcohol / drug use?: No history of alcohol / drug abuse  CIWA: CIWA-Ar BP: 136/79 Pulse Rate: 93 COWS:    Allergies:  Allergies  Allergen Reactions  . Penicillins Other (See Comments)    Reaction:  Unknown  Has patient had a PCN reaction causing immediate rash, facial/tongue/throat swelling, SOB or lightheadedness with hypotension: Unsure Has patient had a PCN reaction causing severe rash involving mucus membranes or skin necrosis: Unsure Has patient had a PCN reaction that required hospitalization Unsure Has patient had a PCN reaction occurring within the last 10 years: Unsure If all of the above answers are "NO", then may proceed with Cephalosporin use.  . Pneumovax [Pneumococcal Polysaccharide Vaccine] Other (See Comments)    Reaction:  Unknown   . Statins Other (See Comments)    Reaction:  Unknown   . Sulfa Antibiotics Other (See Comments)    Reaction:  Unknown     Home Medications:  (Not in a hospital admission)  OB/GYN Status:  No LMP recorded (lmp unknown). Patient has had a hysterectomy.  General Assessment Data Location of Assessment: [facility] ED TTS Assessment: In system Is this a Tele or Face-to-Face Assessment?: Face-to-Face Is this an Initial Assessment or a Re-assessment for this encounter?: Initial Assessment Marital status: Widowed [place] name: na Is patient  pregnant?: No Pregnancy Status: No Living Arrangements: Other (Comment) (asssited living) Can pt return to current living arrangement?: Yes Admission Status: Voluntary Is patient capable of signing voluntary admission?: Yes Referral Source: Self/Family/Friend Insurance type: Medicaid  Medical Screening Exam [facility] Walk-in ONLY) Medical Exam completed: Yes  Crisis Care Plan Living Arrangements: Other (Comment) (asssited living) Legal Guardian:  (na) Name of Psychiatrist:  None Name of Therapist: None  Education Status Is patient currently in school?: No Current Grade: na Highest grade of school patient has completed: 10 Name of school: na Contact person: na  Risk to self with the past 6 months Suicidal Ideation: Yes-Currently Present Has patient been a risk to self within the past 6 months prior to admission? : No Suicidal Intent: No Has patient had any suicidal intent within the past 6 months prior to admission? : No Is patient at risk for suicide?: Yes Suicidal Plan?: Yes-Currently Present Has patient had any suicidal plan within the past 6 months prior to admission? : No Specify Current Suicidal Plan: Fall out of bed and break her neck Access to Means: No What has been your use of drugs/alcohol within the last 12 months?: denies Previous Attempts/Gestures: No How many times?: 0 Other Self Harm Risks: None Triggers for Past Attempts: Unknown Intentional Self Injurious Behavior: None Family Suicide History: No Recent stressful life event(s): Other (Comment) (new assited living home) Persecutory voices/beliefs?: No Depression: Yes Depression Symptoms: Loss of interest in usual pleasures, Feeling worthless/self pity Substance abuse history and/or treatment for substance abuse?: No Suicide prevention information given to non-admitted patients: Not applicable  Risk to Others within the past 6 months Homicidal Ideation: No Does patient have any lifetime risk of violence toward others beyond the six months prior to admission? : No Thoughts of Harm to Others: No Current Homicidal Intent: No Current Homicidal Plan: No Access to Homicidal Means: No Identified Victim: na History of harm to others?: No Assessment of Violence: None Noted Violent Behavior Description: na Does patient have access to weapons?: No Criminal Charges Pending?: No Does patient have a court date: No Is patient on probation?: No  Psychosis Hallucinations: None  noted Delusions: None noted  Mental Status Report Appearance/Hygiene: In hospital gown Eye Contact: Fair Motor Activity: Unremarkable Speech: Soft, Slow Level of Consciousness: Drowsy Mood: Depressed Affect: Depressed Anxiety Level: Minimal Thought Processes: Irrelevant Judgement: Partial Orientation: Place Obsessive Compulsive Thoughts/Behaviors: None  Cognitive Functioning Concentration: Decreased Memory: Remote Intact IQ: Average Insight: Fair Impulse Control: Fair Appetite: Fair Weight Loss: 0 Weight Gain: 0  ADLScreening [facility] Assessment Services) Patient's cognitive ability adequate to safely complete daily activities?: No Patient able to express need for assistance with ADLs?: Yes Independently performs ADLs?: No  Prior Inpatient Therapy Prior Inpatient Therapy: No Prior Therapy Dates: na Prior Therapy Facilty/Provider(s): na Reason for Treatment: na  Prior Outpatient Therapy Prior Outpatient Therapy: No Prior Therapy Dates: na Prior Therapy Facilty/Provider(s): na Reason for Treatment: na Does patient have an ACCT team?: No Does patient have Intensive In-House Services?  : No Does patient have [facility] services? : No Does patient have P4CC services?: No  ADL Screening (condition at time of admission) Patient's cognitive ability adequate to safely complete daily activities?: No Is the patient deaf or have difficulty hearing?: Yes Does the patient have difficulty seeing, even when wearing glasses/contacts?: Yes Does the patient have difficulty concentrating, remembering, or making decisions?: Yes Patient able to express need for assistance with ADLs?: Yes Does the patient have difficulty dressing or bathing?: Yes  Independently performs ADLs?: No Communication: Independent Dressing (OT): Needs assistance Is this a change from baseline?: Pre-admission baseline Grooming: Needs assistance Is this a change from baseline?: Pre-admission baseline Feeding:  Needs assistance Is this a change from baseline?: Pre-admission baseline Bathing: Needs assistance Is this a change from baseline?: Pre-admission baseline Toileting: Needs assistance Is this a change from baseline?: Pre-admission baseline In/Out Bed: Needs assistance Is this a change from baseline?: Pre-admission baseline Walks in Home: Needs assistance Is this a change from baseline?: Pre-admission baseline Does the patient have difficulty walking or climbing stairs?: Yes Weakness of Legs: Both Weakness of Arms/Hands: Both  Home Assistive Devices/Equipment Home Assistive Devices/Equipment: Wheelchair, Environmental consultant (specify type)  Therapy Consults (therapy consults require a physician order) PT Evaluation Needed: No OT Evalulation Needed: No SLP Evaluation Needed: No Abuse/Neglect Assessment (Assessment to be complete while patient is alone) Physical Abuse: Denies Verbal Abuse: Denies Sexual Abuse: Denies Exploitation of patient/patient's resources: Denies Self-Neglect: Denies Values / Beliefs Cultural Requests During Hospitalization: None Spiritual Requests During Hospitalization: None Consults Spiritual Care Consult Needed: No Social Work Consult Needed: No Merchant navy officer (For Healthcare) Does patient have an advance directive?: No Would patient like information on creating an advanced directive?: No - patient declined information    Additional Information 1:1 In Past 12 Months?: No CIRT Risk: No Elopement Risk: No Does patient have medical clearance?: No     Disposition: Case was staffed with Link Snuffer FNP who recommended a Gero-Psych admission as appropriate bed placement is investigated. Disposition Initial Assessment Completed for this Encounter: Yes Disposition of Patient: Inpatient treatment program Type of inpatient treatment program: Adult  On Site Evaluation by:   Reviewed with Physician:    Alfredia Ferguson 04/07/2016 5:59 PM

## 2016-04-07 NOTE — ED Triage Notes (Addendum)
Per EMS, pt from [facility], per staff, patient stated she "wanted to fall and break her neck and die." Pt also reports urinary frequency and burning with urination. 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 A&Ox3.  Patient anxious and tearful upon assessment. Pt reports SI without plan, stating "I would find a way." Hx depression.

## 2016-04-07 NOTE — BH Assessment (Signed)
[facility] Assessment Progress Note   Case was staffed with Link Snuffer FNP who recommended a Gero-Psych admission as appropriate bed placement is investigated.

## 2016-04-08 ENCOUNTER — Encounter (HOSPITAL_COMMUNITY): Payer: Self-pay | Admitting: Physician Assistant

## 2016-04-08 DIAGNOSIS — R9431 Abnormal electrocardiogram [ECG] [EKG]: Secondary | ICD-10-CM | POA: Diagnosis present

## 2016-04-08 DIAGNOSIS — R45851 Suicidal ideations: Secondary | ICD-10-CM

## 2016-04-08 DIAGNOSIS — Z950 Presence of cardiac pacemaker: Secondary | ICD-10-CM

## 2016-04-08 DIAGNOSIS — F332 Major depressive disorder, recurrent severe without psychotic features: Secondary | ICD-10-CM | POA: Diagnosis not present

## 2016-04-08 DIAGNOSIS — Z79899 Other long term (current) drug therapy: Secondary | ICD-10-CM | POA: Diagnosis not present

## 2016-04-08 DIAGNOSIS — J441 Chronic obstructive pulmonary disease with (acute) exacerbation: Secondary | ICD-10-CM | POA: Diagnosis not present

## 2016-04-08 DIAGNOSIS — Z882 Allergy status to sulfonamides status: Secondary | ICD-10-CM | POA: Diagnosis not present

## 2016-04-08 DIAGNOSIS — Z888 Allergy status to other drugs, medicaments and biological substances status: Secondary | ICD-10-CM | POA: Diagnosis not present

## 2016-04-08 DIAGNOSIS — Z880 Allergy status to penicillin: Secondary | ICD-10-CM | POA: Diagnosis not present

## 2016-04-08 DIAGNOSIS — Z8249 Family history of ischemic heart disease and other diseases of the circulatory system: Secondary | ICD-10-CM | POA: Diagnosis not present

## 2016-04-08 LAB — CBC
HEMATOCRIT: 37.4 % (ref 36.0–46.0)
Hemoglobin: 13.1 g/dL (ref 12.0–15.0)
MCH: 28.5 pg (ref 26.0–34.0)
MCHC: 35 g/dL (ref 30.0–36.0)
MCV: 81.5 fL (ref 78.0–100.0)
PLATELETS: 259 10*3/uL (ref 150–400)
RBC: 4.59 MIL/uL (ref 3.87–5.11)
RDW: 14 % (ref 11.5–15.5)
WBC: 7.7 10*3/uL (ref 4.0–10.5)

## 2016-04-08 LAB — CREATININE, SERUM
Creatinine, Ser: 0.8 mg/dL (ref 0.44–1.00)
GFR calc non Af Amer: >60 mL/min (ref >60)

## 2016-04-08 LAB — URINE CULTURE

## 2016-04-08 MED ORDER — MELATONIN 3 MG PO TABS: 3.0000 mg | ORAL_TABLET | Freq: Every day | ORAL | Status: DC

## 2016-04-08 MED ORDER — LORATADINE 10 MG PO TABS
10.0000 mg | ORAL_TABLET | Freq: Every day | ORAL | Status: DC
  Administered 2016-04-08 – 2016-04-11 (×4): 10 mg via ORAL
  Filled 2016-04-08 (×4): qty 1

## 2016-04-08 MED ORDER — ENSURE ENLIVE PO LIQD
237.0000 mL | Freq: Two times a day (BID) | ORAL | Status: DC
  Administered 2016-04-08 – 2016-04-11 (×7): 237 mL via ORAL

## 2016-04-08 MED ORDER — ENOXAPARIN SODIUM 40 MG/0.4ML ~~LOC~~ SOLN
40.0000 mg | SUBCUTANEOUS | Status: DC
  Administered 2016-04-08 – 2016-04-11 (×4): 40 mg via SUBCUTANEOUS
  Filled 2016-04-08 (×4): qty 0.4

## 2016-04-08 MED ORDER — PREDNISONE 20 MG PO TABS
60.0000 mg | ORAL_TABLET | Freq: Once | ORAL | Status: AC
  Administered 2016-04-08: 60 mg via ORAL
  Filled 2016-04-08: qty 3

## 2016-04-08 MED ORDER — ALBUTEROL SULFATE (2.5 MG/3ML) 0.083% IN NEBU
5.0000 mg | INHALATION_SOLUTION | Freq: Once | RESPIRATORY_TRACT | Status: AC
  Administered 2016-04-08: 5 mg via RESPIRATORY_TRACT
  Filled 2016-04-08: qty 6

## 2016-04-08 MED ORDER — PANTOPRAZOLE SODIUM 20 MG PO TBEC
20.0000 mg | DELAYED_RELEASE_TABLET | Freq: Every day | ORAL | Status: DC
  Administered 2016-04-08 – 2016-04-11 (×4): 20 mg via ORAL
  Filled 2016-04-08 (×5): qty 1

## 2016-04-08 MED ORDER — MENTHOL (TOPICAL ANALGESIC) 4 % EX GEL: Freq: Three times a day (TID) | CUTANEOUS | Status: DC

## 2016-04-08 MED ORDER — IPRATROPIUM-ALBUTEROL 0.5-2.5 (3) MG/3ML IN SOLN
3.0000 mL | Freq: Two times a day (BID) | RESPIRATORY_TRACT | Status: DC
  Administered 2016-04-09 – 2016-04-12 (×6): 3 mL via RESPIRATORY_TRACT
  Filled 2016-04-08 (×7): qty 3

## 2016-04-08 MED ORDER — MUSCLE RUB 10-15 % EX CREA
TOPICAL_CREAM | Freq: Three times a day (TID) | CUTANEOUS | Status: DC
  Administered 2016-04-08 – 2016-04-10 (×6): via TOPICAL
  Administered 2016-04-10: 1 via TOPICAL
  Administered 2016-04-11 (×3): via TOPICAL
  Filled 2016-04-08: qty 85

## 2016-04-08 MED ORDER — IPRATROPIUM-ALBUTEROL 0.5-2.5 (3) MG/3ML IN SOLN
3.0000 mL | Freq: Four times a day (QID) | RESPIRATORY_TRACT | Status: DC
  Administered 2016-04-08: 3 mL via RESPIRATORY_TRACT
  Filled 2016-04-08: qty 3

## 2016-04-08 MED ORDER — TRAZODONE HCL 50 MG PO TABS
25.0000 mg | ORAL_TABLET | Freq: Every day | ORAL | Status: DC
  Administered 2016-04-09 – 2016-04-11 (×3): 25 mg via ORAL
  Filled 2016-04-08 (×3): qty 1

## 2016-04-08 MED ORDER — PREDNISONE 50 MG PO TABS
50.0000 mg | ORAL_TABLET | Freq: Every day | ORAL | Status: AC
  Administered 2016-04-09 – 2016-04-12 (×4): 50 mg via ORAL
  Filled 2016-04-08 (×4): qty 1

## 2016-04-08 MED ORDER — ALBUTEROL SULFATE (2.5 MG/3ML) 0.083% IN NEBU: 2.5000 mg | INHALATION_SOLUTION | Freq: Four times a day (QID) | RESPIRATORY_TRACT | Status: DC | PRN

## 2016-04-08 MED ORDER — PSYLLIUM 95 % PO PACK
1.0000 | PACK | Freq: Every day | ORAL | Status: DC
  Administered 2016-04-08 – 2016-04-09 (×2): 1 via ORAL
  Filled 2016-04-08 (×4): qty 1

## 2016-04-08 MED ORDER — MOMETASONE FURO-FORMOTEROL FUM 200-5 MCG/ACT IN AERO
2.0000 | INHALATION_SPRAY | Freq: Two times a day (BID) | RESPIRATORY_TRACT | Status: DC
  Administered 2016-04-08 – 2016-04-12 (×7): 2 via RESPIRATORY_TRACT
  Filled 2016-04-08: qty 8.8

## 2016-04-08 MED ORDER — BENZONATATE 100 MG PO CAPS
100.0000 mg | ORAL_CAPSULE | Freq: Three times a day (TID) | ORAL | Status: DC | PRN
  Administered 2016-04-09 – 2016-04-11 (×2): 100 mg via ORAL
  Filled 2016-04-08 (×2): qty 1

## 2016-04-08 MED ORDER — ALBUTEROL SULFATE (2.5 MG/3ML) 0.083% IN NEBU
2.5000 mg | INHALATION_SOLUTION | RESPIRATORY_TRACT | Status: DC | PRN
  Administered 2016-04-09: 2.5 mg via RESPIRATORY_TRACT
  Filled 2016-04-08: qty 3

## 2016-04-08 MED ORDER — SODIUM CHLORIDE 0.9% FLUSH
3.0000 mL | Freq: Two times a day (BID) | INTRAVENOUS | Status: DC
  Administered 2016-04-08 – 2016-04-10 (×4): 3 mL via INTRAVENOUS

## 2016-04-08 NOTE — H&P (Signed)
History and Physical   Marveline Profeta ZOX:[id number] DOB: Apr 26, 1927 DOA: 04/07/2016  Referring MD/NP/PA: Dr. Criss Alvine, EDP PCP: [name], MD  Patient coming from: [facility]  Chief Complaint: "wanted to fall and break her neck and die," urinary frequency, and dysuria.  HPI: Arayla Kruschke is a 82 y.o. female with a history of COPD, MDD, failure to thrive, AFib s/p PPM, DM, hypothyroidism who was brought from ALF on 11/20 due to suicidal ideation and UTI symptoms. She was evaluated in the ED for psychiatric complaints, started on keflex for UTI and was awaiting inpatient psych placement. On 11/21 she developed wheezing and rhonchi which she reports occurs intermittently. Onset was gradual over hours, now with nonproductive cough, no fever, no chest pain, and no dyspnea. She has associated sore throat but no rhinorrhea, or congestion. Nebulizer treatments helped very little. She was given steroids. CXR on arrival had been negative and she had no leukocytosis and remained afebrile. TRH was asked to admit for COPD exacerbation.   Review of Systems: Denies fever, chills, weight loss, changes in vision or hearing, chest pain, palpitations, shortness of breath, abdominal pain, nausea, vomiting, changes in bowel habits, blood in stool, myalgias, arthralgias, and rash. And per HPI. All others reviewed and are negative.   Past Medical History:  Diagnosis Date  . Arthritis    rheumatoid  . CHF (congestive heart failure) (HCC)   . COPD (chronic obstructive pulmonary disease) (HCC)   . Depression   . Diabetes mellitus without complication (HCC)    type 2  . Failure to thrive in adult   . Frequent falls   . GERD (gastroesophageal reflux disease)   . Hypertension   . Hyponatremia   . Orthostatic hypotension   . Pacemaker   . PAF (paroxysmal atrial fibrillation) (HCC)    a. ? mentioned in notes, no further details available.  . Pneumonia   . Polio   . Secondhand smoke exposure   . Shortness of  breath   . Thyroid disease    hypothyroid    Past Surgical History:  Procedure Laterality Date  . ABDOMINAL HYSTERECTOMY    . CHOLECYSTECTOMY    . EYE SURGERY     - Nonsmoker, no EtOH, no illicit drugs, lives at [facility].   Allergies  Allergen Reactions  . Penicillins Other (See Comments)    Reaction:  Unknown  Has patient had a PCN reaction causing immediate rash, facial/tongue/throat swelling, SOB or lightheadedness with hypotension: Unsure Has patient had a PCN reaction causing severe rash involving mucus membranes or skin necrosis: Unsure Has patient had a PCN reaction that required hospitalization Unsure Has patient had a PCN reaction occurring within the last 10 years: Unsure If all of the above answers are "NO", then may proceed with Cephalosporin use.  . Pneumovax [Pneumococcal Polysaccharide Vaccine] Other (See Comments)    Reaction:  Unknown   . Statins Other (See Comments)    Reaction:  Unknown   . Sulfa Antibiotics Other (See Comments)    Reaction:  Unknown     Family History  Problem Relation Age of Onset  . Heart disease Mother   . Stroke Neg Hx   . Diabetes Neg Hx   . Cancer Neg Hx    - Family history otherwise reviewed and not pertinent.  Prior to Admission medications   Medication Sig Start Date End Date Taking? Authorizing Provider  acetaminophen (TYLENOL) 500 MG tablet Take 500 mg by mouth 2 (two) times daily.  Yes Historical Provider, MD  albuterol (PROVENTIL HFA;VENTOLIN HFA) 108 (90 Base) MCG/ACT inhaler Inhale 2 puffs into the lungs every 6 (six) hours as needed for wheezing or shortness of breath.   Yes Historical Provider, MD  Calcium Carb-Cholecalciferol (CALCIUM 600+D) 600-800 MG-UNIT TABS Take 1 tablet by mouth daily.   Yes Historical Provider, MD  Carboxymethylcell-Glycerin PF (OPTIVE SENSITIVE) 0.5-0.9 % SOLN Place 1 drop into both eyes every 12 (twelve) hours.   Yes Historical Provider, MD  diltiazem (DILACOR XR) 120 MG 24 hr  capsule Take 120 mg by mouth at bedtime. Hold if pulse is less than 55 or if systolic less than 100.   Yes Historical Provider, MD  docusate sodium (COLACE) 100 MG capsule Take 1 capsule (100 mg total) by mouth 2 (two) times daily. Patient taking differently: Take 100 mg by mouth every Monday, Wednesday, and Friday.  12/27/15  Yes Nishant Dhungel, MD  DULoxetine (CYMBALTA) 20 MG capsule Take 20 mg by mouth daily.   Yes Historical Provider, MD  fluticasone-salmeterol (ADVAIR HFA) 115-21 MCG/ACT inhaler Inhale 2 puffs into the lungs 2 (two) times daily.   Yes Historical Provider, MD  furosemide (LASIX) 20 MG tablet Take 10 mg by mouth daily.   Yes Historical Provider, MD  ipratropium (ATROVENT) 0.02 % nebulizer solution Take 0.5 mg by nebulization every 6 (six) hours as needed for wheezing or shortness of breath.   Yes Historical Provider, MD  levothyroxine (SYNTHROID, LEVOTHROID) 75 MCG tablet Take 75 mcg by mouth daily before breakfast.   Yes Historical Provider, MD  loperamide (IMODIUM A-D) 2 MG tablet Take 2 mg by mouth every 6 (six) hours as needed for diarrhea or loose stools.   Yes Historical Provider, MD  loratadine (CLARITIN) 10 MG tablet Take 10 mg by mouth daily.   Yes Historical Provider, MD  losartan (COZAAR) 25 MG tablet Take 25 mg by mouth daily.   Yes Historical Provider, MD  Melatonin 3 MG TABS Take 3 mg by mouth at bedtime.   Yes Historical Provider, MD  Menthol, Topical Analgesic, (BIOFREEZE) 4 % GEL Apply topically 3 (three) times daily. Patient applies to left side and back   Yes Historical Provider, MD  Menthol, Topical Analgesic, (BIOFREEZE) 4 % GEL Apply 1 application topically 3 (three) times daily. To right knee x 2 weeks. Start date 01/22/16 end date 02/05/16   Yes Historical Provider, MD  pantoprazole (PROTONIX) 20 MG tablet Take 20 mg by mouth daily.   Yes Historical Provider, MD  psyllium (REGULOID) 0.52 g capsule Take 0.52 g by mouth at bedtime.   Yes Historical Provider, MD    traMADol (ULTRAM) 50 MG tablet Take 25 mg by mouth every 8 (eight) hours as needed (pain). Give 1/2 to 1 tablet by mouth every 8 hours as needed for pain that is uncontrolled by tylenol.    Yes Historical Provider, MD  traZODone (DESYREL) 50 MG tablet Take 25 mg by mouth daily. As needed For depression   Yes Historical Provider, MD    Physical Exam: Vitals:   04/08/16 1218 04/08/16 1301 04/08/16 1524 04/08/16 1632  BP: 149/81  152/80 121/79  Pulse: 94  88 94  Resp: 22  20 24   Temp:      TempSrc:      SpO2: 97% 95% 97% 95%  Height:       Constitutional: Elderly female in no distress, calm demeanor Eyes: Lids and conjunctivae normal, PERRL ENMT: Mucous membranes are moist. Upper dentures in place. Posterior  pharynx erythematous without enlarged tonsils or exudates. TMs without inflammation or effusion.  Neck: Supple with tender anterior cervical lymphadenopathy, no thyromegaly.  Respiratory: Non-labored breathing without accessory muscle use, 98% on room air. Diffuse inspiratory and expiratory rhochi, and expiratory wheezes bilaterally without expiratory prolongation. Good air movement.  Cardiovascular: Irregular, no murmurs, rubs, or gallops. No carotid bruits. No JVD. No LE edema. 1+ pedal pulses. Abdomen: Normoactive bowel sounds. No tenderness, non-distended, and no masses palpated. No hepatosplenomegaly. GU: No indwelling catheter, wearing depends. Musculoskeletal: No clubbing / cyanosis. No joint deformity upper and lower extremities. Good ROM, no contractures. Normal muscle tone.  Skin: Warm, dry. No rashes, wounds, no ulcers. No significant lesions noted.  Neurologic: CN II-XII grossly intact. Gait not assessed. No aphasia. No focal deficits in motor strength or sensation in all extremities.  Psychiatric: Normal affect, responding appropriately.   Labs on Admission: I have personally reviewed following labs and imaging studies  CBC:  Recent Labs Lab 04/07/16 1450  WBC 6.4   NEUTROABS 3.5  HGB 13.0  HCT 37.7  MCV 82.5  PLT 263   Basic Metabolic Panel:  Recent Labs Lab 04/07/16 1450  NA 129*  K 4.0  CL 97*  CO2 25  GLUCOSE 109*  BUN 11  CREATININE 0.59  CALCIUM 9.0   GFR: CrCl cannot be calculated (Unknown ideal weight.). Liver Function Tests:  Recent Labs Lab 04/07/16 1450  AST 15  ALT 11*  ALKPHOS 71  BILITOT 0.5  PROT 6.2*  ALBUMIN 3.8   No results for input(s): LIPASE, AMYLASE in the last 168 hours. No results for input(s): AMMONIA in the last 168 hours. Coagulation Profile: No results for input(s): INR, PROTIME in the last 168 hours. Cardiac Enzymes: No results for input(s): CKTOTAL, CKMB, CKMBINDEX, TROPONINI in the last 168 hours. BNP (last 3 results) No results for input(s): PROBNP in the last 8760 hours. HbA1C: No results for input(s): HGBA1C in the last 72 hours. CBG: No results for input(s): GLUCAP in the last 168 hours. Lipid Profile: No results for input(s): CHOL, HDL, LDLCALC, TRIG, CHOLHDL, LDLDIRECT in the last 72 hours. Thyroid Function Tests:  Recent Labs  04/07/16 2227  TSH 4.546*   Anemia Panel: No results for input(s): VITAMINB12, FOLATE, FERRITIN, TIBC, IRON, RETICCTPCT in the last 72 hours. Urine analysis:    Component Value Date/Time   COLORURINE YELLOW 04/07/2016 1412   APPEARANCEUR CLOUDY (A) 04/07/2016 1412   LABSPEC 1.011 04/07/2016 1412   PHURINE 6.5 04/07/2016 1412   GLUCOSEU NEGATIVE 04/07/2016 1412   HGBUR TRACE (A) 04/07/2016 1412   BILIRUBINUR NEGATIVE 04/07/2016 1412   KETONESUR NEGATIVE 04/07/2016 1412   PROTEINUR NEGATIVE 04/07/2016 1412   NITRITE NEGATIVE 04/07/2016 1412   LEUKOCYTESUR MODERATE (A) 04/07/2016 1412   Sepsis Labs: @LABRCNTIP (procalcitonin:4,lacticidven:4) ) Recent Results (from the past 240 hour(s))  Urine culture     Status: Abnormal   Collection Time: 04/07/16  2:12 PM  Result Value Ref Range Status   Specimen Description URINE, CLEAN CATCH  Final    Special Requests NONE  Final   Culture MULTIPLE SPECIES PRESENT, SUGGEST RECOLLECTION (A)  Final   Report Status 04/08/2016 FINAL  Final     Radiological Exams on Admission: Dg Chest 2 View  Result Date: 04/07/2016 CLINICAL DATA:  Medical clearance for psychiatric evaluation. Suicidal ideation. Complains of urinary frequency and burning with urination. EXAM: CHEST  2 VIEW COMPARISON:  01/25/2016 FINDINGS: Cardiac pacemaker. Normal heart size and pulmonary vascularity. No focal airspace disease or  consolidation in the lungs. No blunting of costophrenic angles. No pneumothorax. Mediastinal contours appear intact. Degenerative changes in the spine. Calcified and tortuous aorta. IMPRESSION: No active cardiopulmonary disease. Electronically Signed   By: Burman Nieves M.D.   On: 04/07/2016 22:14    EKG: Independently reviewed. Irregular P waves and V paced wide QRS.  Assessment/Plan Principal Problem:   COPD exacerbation (HCC) Active Problems:   Atrial fibrillation (HCC)   CHF (congestive heart failure) (HCC)   Essential (primary) hypertension   Hypothyroidism   Recurrent falls   Hyponatremia   FTT (failure to thrive) in adult   MDD (major depressive disorder), recurrent severe, without psychosis (HCC)   History of pacemaker   QT prolongation   Acute mild COPD/asthma exacerbation: ?viral URI trigger w/oropharyngeal erythema, tender cervical lymphadenopathy. CXR without consolidation or pulmonary edema, no fever. Euvolemic.  - Check RVP - Steroids x 5 days, continue home breathing treatments, duonebs q6h, q3h prn.  - Without productive cough, nonfocal pulm exam, and negative CXR, hold abx - Recheck CXR in AM - Oxygen by nasal cannula to maintain SpO2 88 - 92% (not currently hypoxemic) - Tessalon prn cough  - COPD gold protocol order set utilized  Suicidal ideation/MDD: UDS negative. EtOH negative.  - Disposition to psychiatric facility once medically stable  UTI: Started on  keflex in ED for symptoms + many bacteria, 6-30 WBCs, culture needs to be recollected.  - Recollect urine culture - Continue keflex x 7 days  History of AFib, QT prolongation s/p PPM: Cardiology consulted by EDP for clearance prior to transfer. Device functioning properly - No acute issues per cardiology, will follow up as outpatient - Telemetry  History of diabetes: HbA1c 6% in Aug 2017.  - Measure CBG with labs  Hyponatremia: Appears chronic, euvolemic, suspicious for psychogenic polydipsia.  - Fluid restriction - Monitor in AM  Hypothyroidism: Chronic, stable. TSH in ED 4.546.  - Continue synthroid.   DVT prophylaxis: Lovenox  Code Status: Full  Family Communication: None at bedside Disposition Plan: Observation for COPD exacerbation, dispo to psych facility once medically stable Consults called: Cardiology by EDP  Admission status: Observation  Hazeline Junker, MD [facility] Pager [phone]  If 7PM-7AM, please contact night-coverage [email or web address] Password [facility] 04/08/2016, 4:37 PM

## 2016-04-08 NOTE — ED Notes (Addendum)
Patient complaining of "not being able to breath" even after inhaler use. Wheezing heard upon auscultation. Dr. Criss Alvine made aware and states he will come assess the patient.

## 2016-04-08 NOTE — Consult Note (Signed)
[facility] Face-to-Face Psychiatry Consult   Reason for Consult:  Psychosis with UTI Referring Physician:  EDP Patient Identification: Kathy Peterson MRN:  [id number] Principal Diagnosis: MDD (major depressive disorder), recurrent severe, without psychosis ([place]) Diagnosis:   Patient Active Problem List   Diagnosis Date Noted  . MDD (major depressive disorder), recurrent severe, without psychosis ([place]) [F33.2] 04/08/2016    Priority: High  . Episode of recurrent major depressive disorder ([place]) [F33.9]   . FTT (failure to thrive) in adult [R62.7]   . Hyponatremia [E87.1] 12/24/2015  . Rib fractures [S22.39XA] 12/23/2015  . Atrial fibrillation ([place]) [I48.91] 12/23/2015  . CHF (congestive heart failure) ([place]) [I50.9] 12/23/2015  . COPD (chronic obstructive pulmonary disease) ([place]) [J44.9] 12/23/2015  . Pre-diabetes [R73.03] 12/23/2015  . Essential (primary) hypertension [I10] 12/23/2015  . Hypothyroidism [E03.9] 12/23/2015  . Recurrent falls [R29.6] 12/23/2015    Total Time spent with patient: 25 minutes  Subjective:   Kathy Peterson is a 82 y.o. female patient admitted with reports of confusion and suicidal ideation with concomitant probable UTI. Pt seen and chart reviewed. Pt is alert/oriented to self and place, but not to time or situation or home environment. Pt is calm, cooperative, and appropriate to situation. Pt denies homicidal ideation and psychosis and does not appear to be responding to internal stimuli. Pt does report some suicidal ideation with plan/intent to fall and break her head or neck. Pt will continue to meet geropsychiatry placement.Marland Kitchen  HPI:  I have reviewed and concur with HPI elements below, modified as follows: Kathy Peterson is an 82 y.o. female that presents this date with thoughts of self harm. Patient stated she wanted to "fall out of the bed and break her neck." Patient does not seem to process the content of this writer's questions. This writer attempted to ask patient each  question several times with limited response. Patient did answer "yes" when asked if patient was here voluntarily. Patient admits to being hearing impaired. Information for the assessment was gathered from admission notes. Admission note stated: "Per EMS, pt from [facility], per staff, patient stated she "wanted to fall and break her neck and die." Pt also reports urinary frequency and burning with urination. 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 A&Ox3. Patient anxious and tearfulupon assessment. Pt reports SI without plan, stating "I would find a way." Hx of depression." Case was staffed with Meryl Crutch FNP who recommended a Gero-Psych admission as appropriate bed placement is investigated.  Today on 04/08/16, pt seen as above. Pt continues to present with suicidal ideation and meets inpatient criteria; see above for details.   Past Psychiatric History: MDD  Risk to Self: Suicidal Ideation: Yes-Currently Present Suicidal Intent: No Is patient at risk for suicide?: Yes Suicidal Plan?: Yes-Currently Present Specify Current Suicidal Plan: Fall out of bed and break her neck Access to Means: No What has been your use of drugs/alcohol within the last 12 months?: denies How many times?: 0 Other Self Harm Risks: None Triggers for Past Attempts: Unknown Intentional Self Injurious Behavior: None Risk to Others: Homicidal Ideation: No Thoughts of Harm to Others: No Current Homicidal Intent: No Current Homicidal Plan: No Access to Homicidal Means: No Identified Victim: na History of harm to others?: No Assessment of Violence: None Noted Violent Behavior Description: na Does patient have access to weapons?: No Criminal Charges Pending?: No Does patient have a court date: No Prior Inpatient Therapy: Prior Inpatient Therapy: No Prior Therapy Dates: na Prior Therapy Facilty/Provider(s): na Reason for Treatment: na Prior Outpatient  Therapy: Prior Outpatient Therapy: No Prior Therapy Dates: na Prior  Therapy Facilty/Provider(s): na Reason for Treatment: na Does patient have an ACCT team?: No Does patient have Intensive In-House Services?  : No Does patient have [facility] services? : No Does patient have P4CC services?: No  Past Medical History:  Past Medical History:  Diagnosis Date  . Arthritis    rheumatoid  . Atrial fibrillation ([place])   . CHF (congestive heart failure) ([place])   . COPD (chronic obstructive pulmonary disease) ([place])   . Depression   . Diabetes mellitus without complication ([place])    type 2  . GERD (gastroesophageal reflux disease)   . Hypertension   . Pacemaker   . Pneumonia   . Polio   . Shortness of breath   . Thyroid disease    hypothyroid    Past Surgical History:  Procedure Laterality Date  . ABDOMINAL HYSTERECTOMY    . CHOLECYSTECTOMY    . EYE SURGERY     Family History:  Family History  Problem Relation Age of Onset  . Heart disease Mother   . Stroke Neg Hx   . Diabetes Neg Hx   . Cancer Neg Hx    Family Psychiatric  History: unknown Social History:  History  Alcohol Use No     History  Drug Use No    Social History   Social History  . Marital status: Widowed    Spouse name: N/A  . Number of children: N/A  . Years of education: N/A   Occupational History  . retired    Social History Main Topics  . Smoking status: Never Smoker  . Smokeless tobacco: Never Used  . Alcohol use No  . Drug use: No  . Sexual activity: No   Other Topics Concern  . None   Social History Narrative  . None   Additional Social History:    Allergies:   Allergies  Allergen Reactions  . Penicillins Other (See Comments)    Reaction:  Unknown  Has patient had a PCN reaction causing immediate rash, facial/tongue/throat swelling, SOB or lightheadedness with hypotension: Unsure Has patient had a PCN reaction causing severe rash involving mucus membranes or skin necrosis: Unsure Has patient had a PCN reaction that required hospitalization  Unsure Has patient had a PCN reaction occurring within the last 10 years: Unsure If all of the above answers are "NO", then may proceed with Cephalosporin use.  . Pneumovax [Pneumococcal Polysaccharide Vaccine] Other (See Comments)    Reaction:  Unknown   . Statins Other (See Comments)    Reaction:  Unknown   . Sulfa Antibiotics Other (See Comments)    Reaction:  Unknown     Labs:  Results for orders placed or performed during the hospital encounter of 04/07/16 (from the past 48 hour(s))  Urine rapid drug screen (hosp performed)not at [facility]     Status: None   Collection Time: 04/07/16  2:12 PM  Result Value Ref Range   Opiates NONE DETECTED NONE DETECTED   Cocaine NONE DETECTED NONE DETECTED   Benzodiazepines NONE DETECTED NONE DETECTED   Amphetamines NONE DETECTED NONE DETECTED   Tetrahydrocannabinol NONE DETECTED NONE DETECTED   Barbiturates NONE DETECTED NONE DETECTED    Comment:        DRUG SCREEN FOR MEDICAL PURPOSES ONLY.  IF CONFIRMATION IS NEEDED FOR ANY PURPOSE, NOTIFY LAB WITHIN 5 DAYS.        LOWEST DETECTABLE LIMITS FOR URINE DRUG SCREEN Drug Class  Cutoff (ng/mL) Amphetamine      1000 Barbiturate      200 Benzodiazepine   017 Tricyclics       510 Opiates          300 Cocaine          300 THC              50   Urinalysis, Routine w reflex microscopic (not at [facility])     Status: Abnormal   Collection Time: 04/07/16  2:12 PM  Result Value Ref Range   Color, Urine YELLOW YELLOW   APPearance CLOUDY (A) CLEAR   Specific Gravity, Urine 1.011 1.005 - 1.030   pH 6.5 5.0 - 8.0   Glucose, UA NEGATIVE NEGATIVE mg/dL   Hgb urine dipstick TRACE (A) NEGATIVE   Bilirubin Urine NEGATIVE NEGATIVE   Ketones, ur NEGATIVE NEGATIVE mg/dL   Protein, ur NEGATIVE NEGATIVE mg/dL   Nitrite NEGATIVE NEGATIVE   Leukocytes, UA MODERATE (A) NEGATIVE  Urine microscopic-add on     Status: Abnormal   Collection Time: 04/07/16  2:12 PM  Result Value Ref Range   Squamous Epithelial  / LPF NONE SEEN NONE SEEN   WBC, UA 6-30 0 - 5 WBC/hpf   RBC / HPF NONE SEEN 0 - 5 RBC/hpf   Bacteria, UA MANY (A) NONE SEEN  Comprehensive metabolic panel     Status: Abnormal   Collection Time: 04/07/16  2:50 PM  Result Value Ref Range   Sodium 129 (L) 135 - 145 mmol/L   Potassium 4.0 3.5 - 5.1 mmol/L   Chloride 97 (L) 101 - 111 mmol/L   CO2 25 22 - 32 mmol/L   Glucose, Bld 109 (H) 65 - 99 mg/dL   BUN 11 6 - 20 mg/dL   Creatinine, Ser 0.59 0.44 - 1.00 mg/dL   Calcium 9.0 8.9 - 10.3 mg/dL   Total Protein 6.2 (L) 6.5 - 8.1 g/dL   Albumin 3.8 3.5 - 5.0 g/dL   AST 15 15 - 41 U/L   ALT 11 (L) 14 - 54 U/L   Alkaline Phosphatase 71 38 - 126 U/L   Total Bilirubin 0.5 0.3 - 1.2 mg/dL   GFR calc non Af Amer >60 >60 mL/min   GFR calc Af Amer >60 >60 mL/min    Comment: (NOTE) The eGFR has been calculated using the CKD EPI equation. This calculation has not been validated in all clinical situations. eGFR's persistently <60 mL/min signify possible Chronic Kidney Disease.    Anion gap 7 5 - 15  Ethanol     Status: None   Collection Time: 04/07/16  2:50 PM  Result Value Ref Range   Alcohol, Ethyl (B) <5 <5 mg/dL    Comment:        LOWEST DETECTABLE LIMIT FOR SERUM ALCOHOL IS 5 mg/dL FOR MEDICAL PURPOSES ONLY   CBC with Diff     Status: None   Collection Time: 04/07/16  2:50 PM  Result Value Ref Range   WBC 6.4 4.0 - 10.5 K/uL   RBC 4.57 3.87 - 5.11 MIL/uL   Hemoglobin 13.0 12.0 - 15.0 g/dL   HCT 37.7 36.0 - 46.0 %   MCV 82.5 78.0 - 100.0 fL   MCH 28.4 26.0 - 34.0 pg   MCHC 34.5 30.0 - 36.0 g/dL   RDW 14.0 11.5 - 15.5 %   Platelets 263 150 - 400 K/uL   Neutrophils Relative % 55 %   Neutro Abs 3.5 1.7 -  7.7 K/uL   Lymphocytes Relative 29 %   Lymphs Abs 1.9 0.7 - 4.0 K/uL   Monocytes Relative 11 %   Monocytes Absolute 0.7 0.1 - 1.0 K/uL   Eosinophils Relative 4 %   Eosinophils Absolute 0.2 0.0 - 0.7 K/uL   Basophils Relative 1 %   Basophils Absolute 0.0 0.0 - 0.1 K/uL  TSH      Status: Abnormal   Collection Time: 04/07/16 10:27 PM  Result Value Ref Range   TSH 4.546 (H) 0.350 - 4.500 uIU/mL    Comment: Performed by a 3rd Generation assay with a functional sensitivity of <=0.01 uIU/mL.    Current Facility-Administered Medications  Medication Dose Route Frequency Provider Last Rate Last Dose  . acetaminophen (TYLENOL) tablet 500 mg  500 mg Oral BID Fatima Blank, MD   500 mg at 04/08/16 0936  . albuterol (PROVENTIL HFA;VENTOLIN HFA) 108 (90 Base) MCG/ACT inhaler 2 puff  2 puff Inhalation Q6H PRN Fatima Blank, MD   2 puff at 04/08/16 0835  . cephALEXin (KEFLEX) capsule 250 mg  250 mg Oral Q12H Fatima Blank, MD   250 mg at 04/08/16 0936  . diltiazem (DILACOR XR) 24 hr capsule 120 mg  120 mg Oral QHS Fatima Blank, MD   120 mg at 04/07/16 2239  . DULoxetine (CYMBALTA) DR capsule 20 mg  20 mg Oral Daily Fatima Blank, MD   20 mg at 04/08/16 8588  . furosemide (LASIX) tablet 10 mg  10 mg Oral Daily Fatima Blank, MD   10 mg at 04/08/16 0936  . levothyroxine (SYNTHROID, LEVOTHROID) tablet 75 mcg  75 mcg Oral QAC breakfast Fatima Blank, MD   75 mcg at 04/08/16 [id number]  . losartan (COZAAR) tablet 25 mg  25 mg Oral Daily Fatima Blank, MD   25 mg at 04/08/16 0936  . traMADol (ULTRAM) tablet 25 mg  25 mg Oral Q8H PRN Fatima Blank, MD      . traZODone (DESYREL) tablet 25 mg  25 mg Oral Q24H Fatima Blank, MD   25 mg at 04/07/16 1642   Current Outpatient Prescriptions  Medication Sig Dispense Refill  . acetaminophen (TYLENOL) 500 MG tablet Take 500 mg by mouth 2 (two) times daily.     Marland Kitchen albuterol (PROVENTIL HFA;VENTOLIN HFA) 108 (90 Base) MCG/ACT inhaler Inhale 2 puffs into the lungs every 6 (six) hours as needed for wheezing or shortness of breath.    . Calcium Carb-Cholecalciferol (CALCIUM 600+D) 600-800 MG-UNIT TABS Take 1 tablet by mouth daily.    . Carboxymethylcell-Glycerin PF (OPTIVE  SENSITIVE) 0.5-0.9 % SOLN Place 1 drop into both eyes every 12 (twelve) hours.    Marland Kitchen diltiazem (DILACOR XR) 120 MG 24 hr capsule Take 120 mg by mouth at bedtime. Hold if pulse is less than 55 or if systolic less than 741.    Marland Kitchen docusate sodium (COLACE) 100 MG capsule Take 1 capsule (100 mg total) by mouth 2 (two) times daily. (Patient taking differently: Take 100 mg by mouth every Monday, Wednesday, and Friday. ) 10 capsule 0  . DULoxetine (CYMBALTA) 20 MG capsule Take 20 mg by mouth daily.    . fluticasone-salmeterol (ADVAIR HFA) 115-21 MCG/ACT inhaler Inhale 2 puffs into the lungs 2 (two) times daily.    . furosemide (LASIX) 20 MG tablet Take 10 mg by mouth daily.    Marland Kitchen ipratropium (ATROVENT) 0.02 % nebulizer solution Take 0.5 mg by nebulization every 6 (six)  hours as needed for wheezing or shortness of breath.    . levothyroxine (SYNTHROID, LEVOTHROID) 75 MCG tablet Take 75 mcg by mouth daily before breakfast.    . loperamide (IMODIUM A-D) 2 MG tablet Take 2 mg by mouth every 6 (six) hours as needed for diarrhea or loose stools.    Marland Kitchen loratadine (CLARITIN) 10 MG tablet Take 10 mg by mouth daily.    Marland Kitchen losartan (COZAAR) 25 MG tablet Take 25 mg by mouth daily.    . Melatonin 3 MG TABS Take 3 mg by mouth at bedtime.    . Menthol, Topical Analgesic, (BIOFREEZE) 4 % GEL Apply topically 3 (three) times daily. Patient applies to left side and back    . Menthol, Topical Analgesic, (BIOFREEZE) 4 % GEL Apply 1 application topically 3 (three) times daily. To right knee x 2 weeks. Start date 01/22/16 end date 02/05/16    . pantoprazole (PROTONIX) 20 MG tablet Take 20 mg by mouth daily.    . psyllium (REGULOID) 0.52 g capsule Take 0.52 g by mouth at bedtime.    . traMADol (ULTRAM) 50 MG tablet Take 25 mg by mouth every 8 (eight) hours as needed (pain). Give 1/2 to 1 tablet by mouth every 8 hours as needed for pain that is uncontrolled by tylenol.     . traZODone (DESYREL) 50 MG tablet Take 25 mg by mouth daily. As  needed For depression      Musculoskeletal: Strength & Muscle Tone: spastic Gait & Station: lying down and reports unsteady gait Patient leans: Backward  Psychiatric Specialty Exam: Physical Exam  Review of Systems  Psychiatric/Behavioral: Positive for depression and suicidal ideas. The patient is nervous/anxious and has insomnia.   All other systems reviewed and are negative.   Blood pressure 149/81, pulse 94, temperature 98.2 F (36.8 C), temperature source Oral, resp. rate 22, height '5\' 6"'  (1.676 m), SpO2 95 %.There is no height or weight on file to calculate BMI.  General Appearance: Casual and Fairly Groomed  Eye Contact:  Fair  Speech:  Clear and Coherent and Normal Rate  Volume:  Normal  Mood:  Anxious  Affect:  Appropriate and Congruent  Thought Process:  Coherent, Linear and Descriptions of Associations: Loose  Orientation:  Self and place  Thought Content:  Focused on finding out where she is and why she is here.  Suicidal Thoughts:  Yes.  with intent/plan  Homicidal Thoughts:  No  Memory:  Immediate;   Fair Recent;   Fair Remote;   Fair  Judgement:  Fair  Insight:  Fair  Psychomotor Activity:  Normal  Concentration:  Concentration: Fair and Attention Span: Fair  Recall:  [employer] of Knowledge:  Fair  Language:  Fair  Akathisia:  No  Handed:    AIMS (if indicated):     Assets:  Communication Skills Desire for Improvement Resilience Social Support  ADL's:  Impaired  Cognition:  Impaired,  Moderate  Sleep:      Treatment Plan Summary: MDD, recurrent, severe, without psychosis, with concomitant UTI, unstable, managed as below:  Disposition: Geropsychiatry inpatient hospitalization  Benjamine Mola, [place] 04/08/2016 1:21 PM  Patient seen face-to-face for psychiatric evaluation, chart reviewed and case discussed with the physician extender and developed treatment plan. Reviewed the information documented and agree with the treatment plan. Corena Pilgrim,  MD

## 2016-04-08 NOTE — Consult Note (Signed)
Cardiology Consultation Note    Patient ID: Kathy Peterson, MRN: [id number], DOB/AGE: 10-08-1926 82 y.o. Admit date: 04/07/2016   Date of Consult: 04/08/2016 Primary Physician: [name], MD Primary Cardiologist: ? [place]  Chief Complaint: suicidal ideation Reason for Consultation: has pacemaker and prolonged QT, requested clearance before transfering to behavioral health Requesting MD: [facility] has requested Dr. Criss Alvine obtain cardiac clearance  HPI: Kathy Peterson is a 82 y.o. female with history of failure to thrive, atrial fib (not on anticoag due to fall risk per DC 12/2015), ?CHF, pacemaker, DM, COPD, recurrent falls, hyponatremia, major depression, hypothyroidism, severe orthostatic hypotension whom we are asked to see for cardiac clearance to transfer to behavioral health facility. The patient appears to be confused, answering about half of questions with a helpful answer, otherwise stating she just doesn't know. Admission in our health system in 12/2015 indicates history of atrial fib, presumed not to be on anticoag due to frequent falls. We do not have an echo on file. The patient says she has a cardiologist in [facility] but does not recall his name. She states her pacemaker was put in in the past "because I was in terrible pain." She was brought into [facility] ER because she stated she "wanted to fall and break her neck and die." She was also reporting urinary frequency and burning with urination. She was evaluated for SI with plans to transfer to behavioral health. EKGs were obtained demonstrating irregular rhythm with atrial sensed (P waves), ventricular paced rhythm with occasional PVCs. QTC noted to be prolonged >500 but in the setting of v-paced widened beats and wide PVCs. Since being in the ER the patient has noted worsening SOB and wheezing as well as cough. She had minimal improvement with albuterol inhaler and has been started on nebulizations. She states she had some  chest discomfort earlier this AM but denies any presently. She reports feeling somewhat nauseated. She also had some dysuria which also prompted her facility to bring her to the ED.  She is not really A+O - thought she was in a trailer, knows it's the "month before December," does not know the date/day, Economist or year. When asked about history of dementia she states, "well I must have it." Labs notable for Na 129, glucose 109, normal CBC, TSH 4.56, CXR NAD with pacemaker in place. Per review of telemetry with MD, appears similar to EKGs as reported above.   Past Medical History:  Diagnosis Date  . Arthritis    rheumatoid  . CHF (congestive heart failure) (HCC)   . COPD (chronic obstructive pulmonary disease) (HCC)   . Depression   . Diabetes mellitus without complication (HCC)    type 2  . Failure to thrive in adult   . Frequent falls   . GERD (gastroesophageal reflux disease)   . Hypertension   . Hyponatremia   . Orthostatic hypotension   . Pacemaker   . PAF (paroxysmal atrial fibrillation) (HCC)    a. ? mentioned in notes, no further details available.  . Pneumonia   . Polio   . Secondhand smoke exposure   . Shortness of breath   . Thyroid disease    hypothyroid      Surgical History:  Past Surgical History:  Procedure Laterality Date  . ABDOMINAL HYSTERECTOMY    . CHOLECYSTECTOMY    . EYE SURGERY       Home Meds: Prior to Admission medications   Medication Sig Start Date End Date  Taking? Authorizing Provider  acetaminophen (TYLENOL) 500 MG tablet Take 500 mg by mouth 2 (two) times daily.    Yes Historical Provider, MD  albuterol (PROVENTIL HFA;VENTOLIN HFA) 108 (90 Base) MCG/ACT inhaler Inhale 2 puffs into the lungs every 6 (six) hours as needed for wheezing or shortness of breath.   Yes Historical Provider, MD  Calcium Carb-Cholecalciferol (CALCIUM 600+D) 600-800 MG-UNIT TABS Take 1 tablet by mouth daily.   Yes Historical Provider, MD  Carboxymethylcell-Glycerin PF  (OPTIVE SENSITIVE) 0.5-0.9 % SOLN Place 1 drop into both eyes every 12 (twelve) hours.   Yes Historical Provider, MD  diltiazem (DILACOR XR) 120 MG 24 hr capsule Take 120 mg by mouth at bedtime. Hold if pulse is less than 55 or if systolic less than 100.   Yes Historical Provider, MD  docusate sodium (COLACE) 100 MG capsule Take 1 capsule (100 mg total) by mouth 2 (two) times daily. Patient taking differently: Take 100 mg by mouth every Monday, Wednesday, and Friday.  12/27/15  Yes Nishant Dhungel, MD  DULoxetine (CYMBALTA) 20 MG capsule Take 20 mg by mouth daily.   Yes Historical Provider, MD  fluticasone-salmeterol (ADVAIR HFA) 115-21 MCG/ACT inhaler Inhale 2 puffs into the lungs 2 (two) times daily.   Yes Historical Provider, MD  furosemide (LASIX) 20 MG tablet Take 10 mg by mouth daily.   Yes Historical Provider, MD  ipratropium (ATROVENT) 0.02 % nebulizer solution Take 0.5 mg by nebulization every 6 (six) hours as needed for wheezing or shortness of breath.   Yes Historical Provider, MD  levothyroxine (SYNTHROID, LEVOTHROID) 75 MCG tablet Take 75 mcg by mouth daily before breakfast.   Yes Historical Provider, MD  loperamide (IMODIUM A-D) 2 MG tablet Take 2 mg by mouth every 6 (six) hours as needed for diarrhea or loose stools.   Yes Historical Provider, MD  loratadine (CLARITIN) 10 MG tablet Take 10 mg by mouth daily.   Yes Historical Provider, MD  losartan (COZAAR) 25 MG tablet Take 25 mg by mouth daily.   Yes Historical Provider, MD  Melatonin 3 MG TABS Take 3 mg by mouth at bedtime.   Yes Historical Provider, MD  Menthol, Topical Analgesic, (BIOFREEZE) 4 % GEL Apply topically 3 (three) times daily. Patient applies to left side and back   Yes Historical Provider, MD  Menthol, Topical Analgesic, (BIOFREEZE) 4 % GEL Apply 1 application topically 3 (three) times daily. To right knee x 2 weeks. Start date 01/22/16 end date 02/05/16   Yes Historical Provider, MD  pantoprazole (PROTONIX) 20 MG tablet  Take 20 mg by mouth daily.   Yes Historical Provider, MD  psyllium (REGULOID) 0.52 g capsule Take 0.52 g by mouth at bedtime.   Yes Historical Provider, MD  traMADol (ULTRAM) 50 MG tablet Take 25 mg by mouth every 8 (eight) hours as needed (pain). Give 1/2 to 1 tablet by mouth every 8 hours as needed for pain that is uncontrolled by tylenol.    Yes Historical Provider, MD  traZODone (DESYREL) 50 MG tablet Take 25 mg by mouth daily. As needed For depression   Yes Historical Provider, MD    Inpatient Medications:  . acetaminophen  500 mg Oral BID  . cephALEXin  250 mg Oral Q12H  . diltiazem  120 mg Oral QHS  . DULoxetine  20 mg Oral Daily  . furosemide  10 mg Oral Daily  . levothyroxine  75 mcg Oral QAC breakfast  . losartan  25 mg Oral Daily  .  predniSONE  60 mg Oral Once  . traZODone  25 mg Oral Q24H     Allergies:  Allergies  Allergen Reactions  . Penicillins Other (See Comments)    Reaction:  Unknown  Has patient had a PCN reaction causing immediate rash, facial/tongue/throat swelling, SOB or lightheadedness with hypotension: Unsure Has patient had a PCN reaction causing severe rash involving mucus membranes or skin necrosis: Unsure Has patient had a PCN reaction that required hospitalization Unsure Has patient had a PCN reaction occurring within the last 10 years: Unsure If all of the above answers are "NO", then may proceed with Cephalosporin use.  . Pneumovax [Pneumococcal Polysaccharide Vaccine] Other (See Comments)    Reaction:  Unknown   . Statins Other (See Comments)    Reaction:  Unknown   . Sulfa Antibiotics Other (See Comments)    Reaction:  Unknown     Social History   Social History  . Marital status: Widowed    Spouse name: N/A  . Number of children: N/A  . Years of education: N/A   Occupational History  . retired    Social History Main Topics  . Smoking status: Never Smoker  . Smokeless tobacco: Never Used  . Alcohol use No  . Drug use: No  .  Sexual activity: No   Other Topics Concern  . Not on file   Social History Narrative  . No narrative on file     Family History  Problem Relation Age of Onset  . Heart disease Mother   . Stroke Neg Hx   . Diabetes Neg Hx   . Cancer Neg Hx      Review of Systems:pt reports intermittent skipped beats All other systems reviewed and are otherwise negative except as noted above.  Labs:  Lab Results  Component Value Date   WBC 6.4 04/07/2016   HGB 13.0 04/07/2016   HCT 37.7 04/07/2016   MCV 82.5 04/07/2016   PLT 263 04/07/2016    Recent Labs Lab 04/07/16 1450  NA 129*  K 4.0  CL 97*  CO2 25  BUN 11  CREATININE 0.59  CALCIUM 9.0  PROT 6.2*  BILITOT 0.5  ALKPHOS 71  ALT 11*  AST 15  GLUCOSE 109*   No results found for: CHOL, HDL, LDLCALC, TRIG No results found for: DDIMER  Radiology/Studies:  Dg Chest 2 View  Result Date: 04/07/2016 CLINICAL DATA:  Medical clearance for psychiatric evaluation. Suicidal ideation. Complains of urinary frequency and burning with urination. EXAM: CHEST  2 VIEW COMPARISON:  01/25/2016 FINDINGS: Cardiac pacemaker. Normal heart size and pulmonary vascularity. No focal airspace disease or consolidation in the lungs. No blunting of costophrenic angles. No pneumothorax. Mediastinal contours appear intact. Degenerative changes in the spine. Calcified and tortuous aorta. IMPRESSION: No active cardiopulmonary disease. Electronically Signed   By: Burman Nieves M.D.   On: 04/07/2016 22:14    Wt Readings from Last 3 Encounters:  01/25/16 156 lb (70.8 kg)  01/16/16 156 lb 12.8 oz (71.1 kg)  01/01/16 156 lb 12.8 oz (71.1 kg)    EKG: EKGs were obtained demonstrating irregular rhythm with atrial sensed (P waves), ventricular paced rhythm with occasional PVCs. QTC noted to be prolonged >500 but in the setting of v-paced widened beats and wide PVCs.  Physical Exam: Blood pressure 152/80, pulse 88, temperature 98.2 F (36.8 C), temperature  source Oral, resp. rate 20, height 5\' 6"  (1.676 m), SpO2 97 %. There is no height or weight on file to  calculate BMI. General: Well developed, well nourished WF, in no acute distress. Head: Normocephalic, atraumatic, sclera non-icteric, no xanthomas, nares are without discharge.  Neck: Negative for carotid bruits. JVD not elevated. Lungs: Diffuse expiratory wheezing, rhonchorous. Breathing is unlabored. Heart: Irregular with frequent ectopy, with S1 S2. No murmurs, rubs, or gallops appreciated. Abdomen: Soft, non-tender, non-distended with normoactive bowel sounds. No hepatomegaly. No rebound/guarding. No obvious abdominal masses. Msk:  Strength and tone appear normal for age. Extremities: No clubbing or cyanosis. No edema.  Distal pedal pulses are 2+ and equal bilaterally. Neuro: Alert and oriented X 3. No facial asymmetry. No focal deficit. Moves all extremities spontaneously. Psych:  Responds to questions appropriately with a normal affect.    Assessment and Plan  82 y.o. female with history of failure to thrive, reported atrial fib (not on anticoag due to fall risk per DC 12/2015), ?CHF, pacemaker, DM, COPD, recurrent falls, hyponatremia, major depression, hypothyroidism, severe orthostatic hypotension whom we are asked to see for cardiac clearance to transfer to behavioral health facility. During her ER encounter has also developed SOB with wheezing, cough and rhonchi.  1. Suicidal ideation and other medical issues including possible COPD exacerbation - per EDP. Suspect she needs tune up from pulm perspective and ?UTI before transfer to behavioral health facility. This is the first time we are seeing her so baseline mental status not really known.  2. H/o pacemaker and QT prolongation - device appears to be functioning appropriately on telemetry. QT prolongation appears in the setting of V pacing and ventricular ectopy, appears similar to prior. Do not suspect acute issue therefore she can  follow-up with her primary cardiologist after discharge.  3. Reported h/o atrial fib - atrial sense, V pacing on telemetry. Per notes, presumably not on anticoag due to fall risk.  4. Possible history of heart failure - CXR clear, no edema on exam.  See below for additional thoughts.  Signed, Laurann Montana PA-C 04/08/2016, 3:25 PM Pager: [phone]   Agree with findings by Ronie Spies PA-C  ATSP for cardiac clearance prior to Tx to mental health facility for suicidal ideation. No prior cardiac Hx except PTVPM implanted in HP. ? H/O PAF but currently NSR, a sensing and V pacing. Not an OAC candidate. On tele and 12 lead EKG PM functioning appropriately. Denies CP but is SOB. H/O COPD. She lives in an assisted care facility and there is a question of a UTI. On exam she has rhonchi and exp wheezes. There is no cardiac contraindication for Tx to outside facility but suspect she should have tuning up of her COPD and Rx of UTI. Will see again PRN  Runell Gess, M.D., FACP, [facility], [facility] [address] [place], [place]  [address]  [phone] 04/08/2016 3:51 PM

## 2016-04-08 NOTE — ED Provider Notes (Addendum)
A potential accepting facility [facility]) asks for patient to be specifically "cleared" by cardiology due to having a pacemaker and her prolonged QT. Will consult cards   Pricilla Loveless, MD 04/08/16 1131  Discussed with Rosann Auerbach, she will let Dr. Allyson Sabal know and he will consult    Pricilla Loveless, MD 04/08/16 1136

## 2016-04-08 NOTE — Progress Notes (Signed)
PHARMACIST - PHYSICIAN ORDER COMMUNICATION  CONCERNING: P&T Medication Policy on Herbal Medications  DESCRIPTION:  This patient's order for:  MELATONIN  has been noted.  This product(s) is classified as an "herbal" or natural product. Due to a lack of definitive safety studies or FDA approval, nonstandard manufacturing practices, plus the potential risk of unknown drug-drug interactions while on inpatient medications, the Pharmacy and Therapeutics Committee does not permit the use of "herbal" or natural products of this type within [facility].   ACTION TAKEN: The pharmacy department is unable to verify this order at this time and your patient has been informed of this safety policy. Please reevaluate patient's clinical condition at discharge and address if the herbal or natural product(s) should be resumed at that time.  Terrilee Files, PharmD

## 2016-04-08 NOTE — ED Notes (Signed)
Respiratory notified of nebulization. 

## 2016-04-08 NOTE — ED Provider Notes (Addendum)
Patient is now complaining of dyspnea with some cough. Patient states she has this "all the time". Has diffuse expiratory wheezing and known history of COPD. Minimal improvement with albuterol inhaler, will try albuterol nebulizer and reevaluate. Normal oxygen saturations and no significant increased work of breathing.   Pricilla Loveless, MD 04/08/16 1223  Patient is still wheezing. Given her age, concomitant UTI and now bronchitis, she does not appear medically stable enough for psychiatric admission. Cards is seen her and feels her pacemaker is stable. However she will need a medical admission, I have consulted the hospitalist.  3:36 PM Dr. Jarvis Newcomer to admit   Pricilla Loveless, MD 04/08/16 1536

## 2016-04-09 ENCOUNTER — Observation Stay (HOSPITAL_COMMUNITY): Payer: Medicare Other

## 2016-04-09 DIAGNOSIS — J029 Acute pharyngitis, unspecified: Secondary | ICD-10-CM | POA: Diagnosis present

## 2016-04-09 DIAGNOSIS — R45851 Suicidal ideations: Secondary | ICD-10-CM | POA: Diagnosis present

## 2016-04-09 DIAGNOSIS — Z880 Allergy status to penicillin: Secondary | ICD-10-CM | POA: Diagnosis not present

## 2016-04-09 DIAGNOSIS — R296 Repeated falls: Secondary | ICD-10-CM | POA: Diagnosis present

## 2016-04-09 DIAGNOSIS — I509 Heart failure, unspecified: Secondary | ICD-10-CM | POA: Diagnosis present

## 2016-04-09 DIAGNOSIS — Z8249 Family history of ischemic heart disease and other diseases of the circulatory system: Secondary | ICD-10-CM | POA: Diagnosis not present

## 2016-04-09 DIAGNOSIS — Z882 Allergy status to sulfonamides status: Secondary | ICD-10-CM | POA: Diagnosis not present

## 2016-04-09 DIAGNOSIS — J45901 Unspecified asthma with (acute) exacerbation: Secondary | ICD-10-CM | POA: Diagnosis present

## 2016-04-09 DIAGNOSIS — N3001 Acute cystitis with hematuria: Secondary | ICD-10-CM | POA: Diagnosis present

## 2016-04-09 DIAGNOSIS — Z888 Allergy status to other drugs, medicaments and biological substances status: Secondary | ICD-10-CM | POA: Diagnosis not present

## 2016-04-09 DIAGNOSIS — H9190 Unspecified hearing loss, unspecified ear: Secondary | ICD-10-CM | POA: Diagnosis present

## 2016-04-09 DIAGNOSIS — R627 Adult failure to thrive: Secondary | ICD-10-CM | POA: Diagnosis present

## 2016-04-09 DIAGNOSIS — Z950 Presence of cardiac pacemaker: Secondary | ICD-10-CM | POA: Diagnosis not present

## 2016-04-09 DIAGNOSIS — F332 Major depressive disorder, recurrent severe without psychotic features: Secondary | ICD-10-CM | POA: Diagnosis present

## 2016-04-09 DIAGNOSIS — F4321 Adjustment disorder with depressed mood: Secondary | ICD-10-CM | POA: Diagnosis not present

## 2016-04-09 DIAGNOSIS — E039 Hypothyroidism, unspecified: Secondary | ICD-10-CM | POA: Diagnosis present

## 2016-04-09 DIAGNOSIS — K219 Gastro-esophageal reflux disease without esophagitis: Secondary | ICD-10-CM | POA: Diagnosis present

## 2016-04-09 DIAGNOSIS — E871 Hypo-osmolality and hyponatremia: Secondary | ICD-10-CM | POA: Diagnosis present

## 2016-04-09 DIAGNOSIS — E119 Type 2 diabetes mellitus without complications: Secondary | ICD-10-CM | POA: Diagnosis present

## 2016-04-09 DIAGNOSIS — I4581 Long QT syndrome: Secondary | ICD-10-CM | POA: Diagnosis present

## 2016-04-09 DIAGNOSIS — Z79899 Other long term (current) drug therapy: Secondary | ICD-10-CM | POA: Diagnosis not present

## 2016-04-09 DIAGNOSIS — I480 Paroxysmal atrial fibrillation: Secondary | ICD-10-CM | POA: Diagnosis present

## 2016-04-09 DIAGNOSIS — J441 Chronic obstructive pulmonary disease with (acute) exacerbation: Secondary | ICD-10-CM | POA: Diagnosis present

## 2016-04-09 DIAGNOSIS — I110 Hypertensive heart disease with heart failure: Secondary | ICD-10-CM | POA: Diagnosis present

## 2016-04-09 LAB — RESPIRATORY PANEL BY PCR
Adenovirus: NOT DETECTED
BORDETELLA PERTUSSIS-RVPCR: NOT DETECTED
CHLAMYDOPHILA PNEUMONIAE-RVPPCR: NOT DETECTED
CORONAVIRUS NL63-RVPPCR: NOT DETECTED
Coronavirus 229E: NOT DETECTED
Coronavirus HKU1: NOT DETECTED
Coronavirus OC43: NOT DETECTED
INFLUENZA A-RVPPCR: NOT DETECTED
Influenza B: NOT DETECTED
Metapneumovirus: NOT DETECTED
Mycoplasma pneumoniae: NOT DETECTED
PARAINFLUENZA VIRUS 3-RVPPCR: NOT DETECTED
PARAINFLUENZA VIRUS 4-RVPPCR: NOT DETECTED
Parainfluenza Virus 1: NOT DETECTED
Parainfluenza Virus 2: NOT DETECTED
RESPIRATORY SYNCYTIAL VIRUS-RVPPCR: NOT DETECTED
RHINOVIRUS / ENTEROVIRUS - RVPPCR: DETECTED — AB

## 2016-04-09 LAB — BASIC METABOLIC PANEL
ANION GAP: 9 (ref 5–15)
BUN: 21 mg/dL — ABNORMAL HIGH (ref 6–20)
CHLORIDE: 93 mmol/L — AB (ref 101–111)
CO2: 26 mmol/L (ref 22–32)
Calcium: 9.5 mg/dL (ref 8.9–10.3)
Creatinine, Ser: 0.8 mg/dL (ref 0.44–1.00)
GFR calc non Af Amer: >60 mL/min (ref >60)
Glucose, Bld: 184 mg/dL — ABNORMAL HIGH (ref 65–99)
Potassium: 4.5 mmol/L (ref 3.5–5.1)
Sodium: 128 mmol/L — ABNORMAL LOW (ref 135–145)

## 2016-04-09 LAB — CBC
HEMATOCRIT: 37 % (ref 36.0–46.0)
HEMOGLOBIN: 12.8 g/dL (ref 12.0–15.0)
MCH: 28.6 pg (ref 26.0–34.0)
MCHC: 34.6 g/dL (ref 30.0–36.0)
MCV: 82.6 fL (ref 78.0–100.0)
Platelets: 263 10*3/uL (ref 150–400)
RBC: 4.48 MIL/uL (ref 3.87–5.11)
RDW: 13.9 % (ref 11.5–15.5)
WBC: 4.6 10*3/uL (ref 4.0–10.5)

## 2016-04-09 MED ORDER — LEVOTHYROXINE SODIUM 100 MCG PO TABS
100.0000 ug | ORAL_TABLET | Freq: Every day | ORAL | Status: DC
  Administered 2016-04-10 – 2016-04-12 (×3): 100 ug via ORAL
  Filled 2016-04-09 (×3): qty 1

## 2016-04-09 MED ORDER — GUAIFENESIN ER 600 MG PO TB12
600.0000 mg | ORAL_TABLET | Freq: Two times a day (BID) | ORAL | Status: DC
  Administered 2016-04-09 – 2016-04-11 (×6): 600 mg via ORAL
  Filled 2016-04-09 (×6): qty 1

## 2016-04-09 MED ORDER — SODIUM CHLORIDE 0.9 % IV SOLN
INTRAVENOUS | Status: AC
  Administered 2016-04-09: 12:00:00 via INTRAVENOUS

## 2016-04-09 NOTE — Progress Notes (Signed)
Patient will go to inpatient psych once medically cleared.

## 2016-04-09 NOTE — Evaluation (Signed)
Physical Therapy Evaluation Patient Details Name: Kathy Peterson MRN: [id number] DOB: July 06, 1926 Today's Date: 04/09/2016   History of Present Illness  82 y.o. female admitted from ALF with COPD, suicidal ideation, UTI. PMH of a fib, CHF, falls, depression, DM, pacemaker. Pt reports h/o polio which affected RLE at age 49.   Clinical Impression  Pt admitted with above diagnosis. Pt currently with functional limitations due to the deficits listed below (see PT Problem List). +2 max assist for sit to stand and to ambulate 5' with RW. 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 Prior functional level unclear as pt is poor historian, but at present she requires +2 assist and would benefit from SNF level of care. She could return to ALF if they are able to provide this.  Pt will benefit from skilled PT to increase their independence and safety with mobility to allow discharge to the venue listed below.       Follow Up Recommendations SNF;Supervision/Assistance - 24 hour    Equipment Recommendations  None recommended by PT    Recommendations for Other Services       Precautions / Restrictions Precautions Precautions: Fall Precaution Comments: limited R hip and knee flexion 2* polio Restrictions Weight Bearing Restrictions: No      Mobility  Bed Mobility Overal bed mobility: +2 for physical assistance;Needs Assistance Bed Mobility: Supine to Sit;Sit to Supine     Supine to sit: +2 for physical assistance;Max assist Sit to supine: +2 for physical assistance;Mod assist   General bed mobility comments: assist to raise trunk, mod A for LEs into bed  Transfers Overall transfer level: Needs assistance Equipment used: Rolling walker (2 wheeled) Transfers: Sit to/from Stand Sit to Stand: +2 physical assistance;+2 safety/equipment;Max assist         General transfer comment: assist to rise, pt with very limited R hip flexion and maintains trunk in extended position in  sitting  Ambulation/Gait Ambulation/Gait assistance: +2 physical assistance;Mod assist Ambulation Distance (Feet): 8 Feet (5' + 3') Assistive device: Rolling walker (2 wheeled) Gait Pattern/deviations: Step-to pattern;Decreased step length - right;Decreased step length - left   Gait velocity interpretation: Below normal speed for age/gender General Gait Details: mod A for balance, HR 126 with activity, 2/4 dyspnea, SaO2 98% on RA with activity  Stairs            Wheelchair Mobility    Modified Rankin (Stroke Patients Only)       Balance Overall balance assessment: Needs assistance Sitting-balance support: Feet supported Sitting balance-Leahy Scale: Poor Sitting balance - Comments: mod A for posterior lean Postural control: Posterior lean Standing balance support: Bilateral upper extremity supported Standing balance-Leahy Scale: Poor Standing balance comment: min to mod A with BUE support on RW                             Pertinent Vitals/Pain Pain Assessment: No/denies pain    Home Living Family/patient expects to be discharged to:: Assisted living               Home Equipment: Walker - 2 wheels;Wheelchair - manual      Prior Function Level of Independence: Needs assistance   Gait / Transfers Assistance Needed: uses walker for ambulation, WC to dining room (obtained from previous admission, pt not able to provide detailed info of prior function)  ADL's / Homemaking Assistance Needed: receives assistance for dressing        Hand Dominance  Extremity/Trunk Assessment   Upper Extremity Assessment: Overall WFL for tasks assessed           Lower Extremity Assessment: RLE deficits/detail RLE Deficits / Details: R hip flexion 25* AAROM (pt report hip is "locked since Polio at age 24"), R knee flexion AAROM 20*, SLR 2/5    Cervical / Trunk Assessment: Kyphotic  Communication   Communication: No difficulties  Cognition  Arousal/Alertness: Awake/alert Behavior During Therapy: WFL for tasks assessed/performed Overall Cognitive Status: No family/caregiver present to determine baseline cognitive functioning (oriented to self, location, birthday.  Not able to provide details of prior functional level. Can follow directions. )                      General Comments      Exercises     Assessment/Plan    PT Assessment Patient needs continued PT services  PT Problem List Decreased strength;Decreased range of motion;Decreased activity tolerance;Decreased balance;Decreased knowledge of use of DME;Decreased mobility;Decreased cognition          PT Treatment Interventions Gait training;Functional mobility training;Therapeutic exercise;Therapeutic activities;DME instruction;Patient/family education    PT Goals (Current goals can be found in the Care Plan section)  Acute Rehab PT Goals PT Goal Formulation: Patient unable to participate in goal setting Time For Goal Achievement: 04/23/16 Potential to Achieve Goals: Fair    Frequency Min 3X/week   Barriers to discharge        Co-evaluation               End of Session Equipment Utilized During Treatment: Gait belt Activity Tolerance: Patient tolerated treatment well Patient left: in bed;with nursing/sitter in room;with call bell/phone within reach Nurse Communication: Mobility status    Functional Assessment Tool Used: clinical judgement Functional Limitation: Mobility: Walking and moving around Mobility: Walking and Moving Around Current Status [phone]): At least 60 percent but less than 80 percent impaired, limited or restricted Mobility: Walking and Moving Around Goal Status [phone]): At least 40 percent but less than 60 percent impaired, limited or restricted    Time: 9678-9381 PT Time Calculation (min) (ACUTE ONLY): 20 min   Charges:   PT Evaluation $PT Eval Moderate Complexity: 1 Procedure     PT G Codes:   PT G-Codes **NOT  FOR INPATIENT CLASS** Functional Assessment Tool Used: clinical judgement Functional Limitation: Mobility: Walking and moving around Mobility: Walking and Moving Around Current Status (O1751): At least 60 percent but less than 80 percent impaired, limited or restricted Mobility: Walking and Moving Around Goal Status [phone]): At least 40 percent but less than 60 percent impaired, limited or restricted    Tamala Ser 04/09/2016, 11:16 AM [phone]

## 2016-04-09 NOTE — Progress Notes (Signed)
PROGRESS NOTE  Nara Paternoster PYP:[id number] DOB: [birth year] DOA: 04/07/2016 PCP: [name], MD  HPI/Recap of past 24 hours:  Crying and irritable, not able to provide reliable history On room air She states" put me 6 feets under"  Assessment/Plan: Principal Problem:   COPD exacerbation (HCC) Active Problems:   Atrial fibrillation (HCC)   CHF (congestive heart failure) (HCC)   Essential (primary) hypertension   Hypothyroidism   Recurrent falls   Hyponatremia   FTT (failure to thrive) in adult   MDD (major depressive disorder), recurrent severe, without psychosis (HCC)   History of pacemaker   QT prolongation   Acute mild COPD/asthma exacerbation: ?viral URI trigger w/oropharyngeal erythema, tender cervical lymphadenopathy. CXR without consolidation or pulmonary edema, no fever. Euvolemic.  - RVP pending -abx,  Steroids x 5 days, continue home breathing treatments, duonebs q6h, q3h prn.  -COPD gold protocol order set utilized  Suicidal ideation/MDD: UDS negative. EtOH negative.  - Disposition to psychiatric facility once medically stable  UTI:  Did report dysuria, no leukocytosis, no fever Started on keflex in ED for symptoms + many bacteria, 6-30 WBCs, culture needs to be recollected.  -  Continue keflex x 7 days  Hyponatremia:  Appears chronic, she has been on trazodoen/cymbalta chronically, she is also on low dose lasix daily, euvolemic to dry - d/c lasix, gentle hydration, -repeat na in am  History of AFib, QT prolongation s/p PPM: Cardiology consulted by EDP for clearance prior to transfer. Device functioning properly - No acute issues per cardiology, will follow up as outpatient - Telemetry  History of diabetes: HbA1c 6% in Aug 2017.  - Measure CBG with labs   Hypothyroidism: Chronic, stable. TSH in ED 4.546.  - increase synthroid from daily to 100 mcg daily  DVT prophylaxis: Lovenox  Code Status: Full  Family Communication: None at  bedside Disposition Plan: Observation for COPD exacerbation, dispo to psych facility once medically stable Consults called: Cardiology , psychiatry   Procedures:  none  Antibiotics:  keflex   Objective: BP 117/86 (BP Location: Right Arm)   Pulse 90   Temp 98.5 F (36.9 C) (Oral)   Resp 20   Ht 5\' 6"  (1.676 m)   Wt 67.2 kg (148 lb 2.4 oz)   LMP  (LMP Unknown)   SpO2 98%   BMI 23.91 kg/m   Intake/Output Summary (Last 24 hours) at 04/09/16 0953 Last data filed at 04/09/16 0816  Gross per 24 hour  Intake              480 ml  Output                0 ml  Net              480 ml   Filed Weights   04/08/16 1715  Weight: 67.2 kg (148 lb 2.4 oz)    Exam:   General:  crying  Cardiovascular: RRR  Respiratory: CTABL  Abdomen: Soft/ND/NT, positive BS  Musculoskeletal: No Edema  Neuro: slight confusion,   Data Reviewed: Basic Metabolic Panel:  Recent Labs Lab 04/07/16 1450 04/08/16 1842 04/09/16 0519  NA 129*  --  128*  K 4.0  --  4.5  CL 97*  --  93*  CO2 25  --  26  GLUCOSE 109*  --  184*  BUN 11  --  21*  CREATININE 0.59 0.80 0.80  CALCIUM 9.0  --  9.5   Liver Function Tests:  Recent Labs  Lab 04/07/16 1450  AST 15  ALT 11*  ALKPHOS 71  BILITOT 0.5  PROT 6.2*  ALBUMIN 3.8   No results for input(s): LIPASE, AMYLASE in the last 168 hours. No results for input(s): AMMONIA in the last 168 hours. CBC:  Recent Labs Lab 04/07/16 1450 04/08/16 1842 04/09/16 0519  WBC 6.4 7.7 4.6  NEUTROABS 3.5  --   --   HGB 13.0 13.1 12.8  HCT 37.7 37.4 37.0  MCV 82.5 81.5 82.6  PLT 263 259 263   Cardiac Enzymes:   No results for input(s): CKTOTAL, CKMB, CKMBINDEX, TROPONINI in the last 168 hours. BNP (last 3 results)  Recent Labs  12/23/15 1710 01/25/16 1259  BNP 855.7* 493.6*    ProBNP (last 3 results) No results for input(s): PROBNP in the last 8760 hours.  CBG: No results for input(s): GLUCAP in the last 168 hours.  Recent Results  (from the past 240 hour(s))  Urine culture     Status: Abnormal   Collection Time: 04/07/16  2:12 PM  Result Value Ref Range Status   Specimen Description URINE, CLEAN CATCH  Final   Special Requests NONE  Final   Culture MULTIPLE SPECIES PRESENT, SUGGEST RECOLLECTION (A)  Final   Report Status 04/08/2016 FINAL  Final     Studies: Portable Chest 1 View  Result Date: 04/09/2016 CLINICAL DATA:  COPD EXAM: PORTABLE CHEST 1 VIEW COMPARISON:  04/07/2016 FINDINGS: Cardiac shadow is mildly enlarged. Pacing device is again seen and stable. The lungs are well aerated bilaterally. Left rib fractures with healing are noted. No pneumothorax is seen. No acute bony abnormality is noted. IMPRESSION: Healing left rib fractures.  No acute abnormality noted. Electronically Signed   By: Alcide Clever M.D.   On: 04/09/2016 07:47    Scheduled Meds: . acetaminophen  500 mg Oral BID  . cephALEXin  250 mg Oral Q12H  . diltiazem  120 mg Oral QHS  . DULoxetine  20 mg Oral Daily  . enoxaparin (LOVENOX) injection  40 mg Subcutaneous Q24H  . feeding supplement (ENSURE ENLIVE)  237 mL Oral BID BM  . ipratropium-albuterol  3 mL Nebulization BID  . levothyroxine  75 mcg Oral QAC breakfast  . loratadine  10 mg Oral Daily  . losartan  25 mg Oral Daily  . mometasone-formoterol  2 puff Inhalation BID  . MUSCLE RUB   Topical TID  . pantoprazole  20 mg Oral Daily  . predniSONE  50 mg Oral Q breakfast  . psyllium  1 packet Oral QHS  . sodium chloride flush  3 mL Intravenous Q12H  . traZODone  25 mg Oral QHS    Continuous Infusions: . sodium chloride       Time spent: 25 mins  Javarie Crisp MD, PhD  [facility] Pager [phone]. If 7PM-7AM, please contact night-coverage at [email or web address], password [facility] 04/09/2016, 9:53 AM  LOS: 0 days

## 2016-04-09 NOTE — Progress Notes (Signed)
Nutrition Brief Note  RD consulted via COPD gold Protocol.  Patient consuming 100% of meals. Pt with slight increase in weight in August, likely d/t fluid fluctuation. Pt is receiving Ensure supplements.  Wt Readings from Last 15 Encounters:  04/08/16 148 lb 2.4 oz (67.2 kg)  01/25/16 156 lb (70.8 kg)  01/16/16 156 lb 12.8 oz (71.1 kg)  01/01/16 156 lb 12.8 oz (71.1 kg)  12/28/15 152 lb (68.9 kg)  12/23/15 152 lb (68.9 kg)    Body mass index is 23.91 kg/m. Patient meets criteria for normal range based on current BMI.   Current diet order is Heart Healthy, patient is consuming approximately 100% of meals at this time. Labs and medications reviewed.   No nutrition interventions warranted at this time. If nutrition issues arise, please consult RD.   Tilda Franco, MS, RD, LDN Pager: [phone] After Hours Pager: [phone]

## 2016-04-10 LAB — BASIC METABOLIC PANEL
ANION GAP: 6 (ref 5–15)
BUN: 27 mg/dL — ABNORMAL HIGH (ref 6–20)
CALCIUM: 9 mg/dL (ref 8.9–10.3)
CO2: 25 mmol/L (ref 22–32)
Chloride: 96 mmol/L — ABNORMAL LOW (ref 101–111)
Creatinine, Ser: 0.7 mg/dL (ref 0.44–1.00)
Glucose, Bld: 158 mg/dL — ABNORMAL HIGH (ref 65–99)
Potassium: 4.9 mmol/L (ref 3.5–5.1)
Sodium: 127 mmol/L — ABNORMAL LOW (ref 135–145)

## 2016-04-10 LAB — MAGNESIUM: MAGNESIUM: 1.9 mg/dL (ref 1.7–2.4)

## 2016-04-10 MED ORDER — SODIUM CHLORIDE 0.9 % IV SOLN
INTRAVENOUS | Status: AC
  Administered 2016-04-10 – 2016-04-11 (×2): via INTRAVENOUS

## 2016-04-10 NOTE — Progress Notes (Signed)
PROGRESS NOTE  Kathy Peterson WPY:[id number] DOB: [birth year] DOA: 04/07/2016 PCP: [name], MD  HPI/Recap of past 24 hours:  More pleasant today, want to go back to  [place],  On room air   Assessment/Plan: Principal Problem:   COPD exacerbation (HCC) Active Problems:   Atrial fibrillation (HCC)   CHF (congestive heart failure) (HCC)   Essential (primary) hypertension   Hypothyroidism   Recurrent falls   Hyponatremia   FTT (failure to thrive) in adult   MDD (major depressive disorder), recurrent severe, without psychosis (HCC)   History of pacemaker   QT prolongation   Acute mild COPD/asthma exacerbation: ?viral URI trigger w/oropharyngeal erythema, tender cervical lymphadenopathy. CXR without consolidation or pulmonary edema, no fever. Euvolemic.  - RVP + rhinovirus -abx,  Steroids x 5 days, continue home breathing treatments, duonebs q6h, q3h prn.  -COPD gold protocol order set utilized  Suicidal ideation/MDD: UDS negative. EtOH negative.  - Disposition to psychiatric facility once medically stable  UTI:  Did report dysuria, no leukocytosis, no fever Started on keflex in ED for symptoms + many bacteria, 6-30 WBCs, culture needs to be recollected.  -  Continue keflex x 7 days  Hyponatremia:  Appears chronic, she has been on trazodoen/cymbalta chronically, she is also on low dose lasix daily, euvolemic to dry - d/c lasix, gentle hydration, -repeat na in am, remain low, continue ivf, free water restriction  History of AFib, QT prolongation s/p PPM: Cardiology consulted by EDP for clearance prior to transfer. Device functioning properly - No acute issues per cardiology, will follow up as outpatient - Telemetry  History of diabetes: HbA1c 6% in Aug 2017.  - Measure CBG with labs   Hypothyroidism: Chronic, stable. TSH in ED 4.546.  - increase synthroid from daily to 100 mcg daily  DVT prophylaxis: Lovenox  Code Status: Full  Family  Communication: None at bedside Disposition Plan: dispo to psych facility once medically stable Consults called: Cardiology , psychiatry   Procedures:  none  Antibiotics:  keflex   Objective: BP (!) 146/95 (BP Location: Right Arm)   Pulse 65   Temp 98.6 F (37 C) (Oral)   Resp 17   Ht 5\' 6"  (1.676 m)   Wt 71.7 kg (158 lb 1.1 oz)   LMP  (LMP Unknown)   SpO2 97%   BMI 25.51 kg/m   Intake/Output Summary (Last 24 hours) at 04/10/16 1419 Last data filed at 04/10/16 0602  Gross per 24 hour  Intake             1965 ml  Output                0 ml  Net             1965 ml   Filed Weights   04/08/16 1715 04/10/16 0557  Weight: 67.2 kg (148 lb 2.4 oz) 71.7 kg (158 lb 1.1 oz)    Exam:   General:  Much more pleasant  Cardiovascular: RRR  Respiratory: scattered, intermittent minimal wheezing  Abdomen: Soft/ND/NT, positive BS  Musculoskeletal: No Edema  Neuro: slight confusion,   Data Reviewed: Basic Metabolic Panel:  Recent Labs Lab 04/07/16 1450 04/08/16 1842 04/09/16 0519 04/10/16 0501  NA 129*  --  128* 127*  K 4.0  --  4.5 4.9  CL 97*  --  93* 96*  CO2 25  --  26 25  GLUCOSE 109*  --  184* 158*  BUN 11  --  21*  27*  CREATININE 0.59 0.80 0.80 0.70  CALCIUM 9.0  --  9.5 9.0  MG  --   --   --  1.9   Liver Function Tests:  Recent Labs Lab 04/07/16 1450  AST 15  ALT 11*  ALKPHOS 71  BILITOT 0.5  PROT 6.2*  ALBUMIN 3.8   No results for input(s): LIPASE, AMYLASE in the last 168 hours. No results for input(s): AMMONIA in the last 168 hours. CBC:  Recent Labs Lab 04/07/16 1450 04/08/16 1842 04/09/16 0519  WBC 6.4 7.7 4.6  NEUTROABS 3.5  --   --   HGB 13.0 13.1 12.8  HCT 37.7 37.4 37.0  MCV 82.5 81.5 82.6  PLT 263 259 263   Cardiac Enzymes:   No results for input(s): CKTOTAL, CKMB, CKMBINDEX, TROPONINI in the last 168 hours. BNP (last 3 results)  Recent Labs  12/23/15 1710 01/25/16 1259  BNP 855.7* 493.6*    ProBNP (last 3  results) No results for input(s): PROBNP in the last 8760 hours.  CBG: No results for input(s): GLUCAP in the last 168 hours.  Recent Results (from the past 240 hour(s))  Urine culture     Status: Abnormal   Collection Time: 04/07/16  2:12 PM  Result Value Ref Range Status   Specimen Description URINE, CLEAN CATCH  Final   Special Requests NONE  Final   Culture MULTIPLE SPECIES PRESENT, SUGGEST RECOLLECTION (A)  Final   Report Status 04/08/2016 FINAL  Final  Respiratory Panel by PCR     Status: Abnormal   Collection Time: 04/08/16  7:56 PM  Result Value Ref Range Status   Adenovirus NOT DETECTED NOT DETECTED Final   Coronavirus 229E NOT DETECTED NOT DETECTED Final   Coronavirus HKU1 NOT DETECTED NOT DETECTED Final   Coronavirus NL63 NOT DETECTED NOT DETECTED Final   Coronavirus OC43 NOT DETECTED NOT DETECTED Final   Metapneumovirus NOT DETECTED NOT DETECTED Final   Rhinovirus / Enterovirus DETECTED (A) NOT DETECTED Final   Influenza A NOT DETECTED NOT DETECTED Final   Influenza B NOT DETECTED NOT DETECTED Final   Parainfluenza Virus 1 NOT DETECTED NOT DETECTED Final   Parainfluenza Virus 2 NOT DETECTED NOT DETECTED Final   Parainfluenza Virus 3 NOT DETECTED NOT DETECTED Final   Parainfluenza Virus 4 NOT DETECTED NOT DETECTED Final   Respiratory Syncytial Virus NOT DETECTED NOT DETECTED Final   Bordetella pertussis NOT DETECTED NOT DETECTED Final   Chlamydophila pneumoniae NOT DETECTED NOT DETECTED Final   Mycoplasma pneumoniae NOT DETECTED NOT DETECTED Final    Comment: Performed at [facility]     Studies: No results found.  Scheduled Meds: . acetaminophen  500 mg Oral BID  . cephALEXin  250 mg Oral Q12H  . diltiazem  120 mg Oral QHS  . DULoxetine  20 mg Oral Daily  . enoxaparin (LOVENOX) injection  40 mg Subcutaneous Q24H  . feeding supplement (ENSURE ENLIVE)  237 mL Oral BID BM  . guaiFENesin  600 mg Oral BID  . ipratropium-albuterol  3 mL Nebulization BID    . levothyroxine  100 mcg Oral QAC breakfast  . loratadine  10 mg Oral Daily  . losartan  25 mg Oral Daily  . mometasone-formoterol  2 puff Inhalation BID  . MUSCLE RUB   Topical TID  . pantoprazole  20 mg Oral Daily  . predniSONE  50 mg Oral Q breakfast  . psyllium  1 packet Oral QHS  . sodium chloride flush  3 mL Intravenous Q12H  . traZODone  25 mg Oral QHS    Continuous Infusions:    Time spent: 25 mins  Francisca Harbuck MD, PhD  [facility] Pager [phone]. If 7PM-7AM, please contact night-coverage at [email or web address], password [email or web address] 04/10/2016, 2:19 PM  LOS: 1 day

## 2016-04-10 NOTE — Evaluation (Signed)
Occupational Therapy Evaluation Patient Details Name: Kathy Peterson MRN: [id number] DOB: 01/19/1927 Today's Date: 04/10/2016    History of Present Illness 82 y.o. female admitted from ALF with COPD, suicidal ideation, UTI. PMH of a fib, CHF, falls, depression, DM, pacemaker. Pt reports h/o polio which affected RLE at age 44.    Clinical Impression   Pt was admitted for the above.  She reports that she has assistance for adls at [facility].  Unsure of PLOF.  Pt currently needs max +2 assistance to stand for adls/transfers.  She has excessive trunk extension and it is unsafe to transfer her to 3:1 commode. Will focus on sit to stand for adls while in acute setting    Follow Up Recommendations   (per chart, inpt psych planned:  needs +2 assistance)    Equipment Recommendations  None recommended by OT    Recommendations for Other Services       Precautions / Restrictions Precautions Precautions: Fall Precaution Comments: limited R hip and knee flexion 2* polio Restrictions Weight Bearing Restrictions: No      Mobility Bed Mobility         Supine to sit: Mod assist;HOB elevated     General bed mobility comments: pt moved legs over EOB, used HOB raised to assist, and pt needed assistance with trunk  Transfers   Equipment used: Rolling walker (2 wheeled)   Sit to Stand: +2 physical assistance;+2 safety/equipment;Max assist         General transfer comment: assist to rise, pt with very limited R hip flexion and maintains trunk in extended position in sitting    Balance                                            ADL Overall ADL's : Needs assistance/impaired     Grooming: Set up;Wash/dry hands;Wash/dry face   Upper Body Bathing: Moderate assistance;Sitting   Lower Body Bathing: Maximal assistance;+2 for physical assistance;Sit to/from stand   Upper Body Dressing : Bed level;Moderate assistance   Lower Body Dressing: Total  assistance;+2 for physical assistance;Sit to/from stand   Toilet Transfer: Maximal assistance;+2 for physical assistance;+2 for safety/equipment;Stand-pivot;RW (max +2 for sit to stand, SPT to recliner with min +2 after)             General ADL Comments: needed A +2 assistance for sit to stand as pt extends backwards. She cannot maintain unsupported sitting due to trunk extension.   Pt with limited ROM in R hip.  Once standing, she was able to pivot to recliner.  It would not be safe to assist pt to 3;1 commode due to trunk extension.     Vision     Perception     Praxis      Pertinent Vitals/Pain Pain Assessment: Faces Faces Pain Scale: Hurts a little bit Pain Location: headache Pain Descriptors / Indicators: Aching Pain Intervention(s): Limited activity within patient's tolerance;Monitored during session;Repositioned;Patient requesting pain meds-RN notified     Hand Dominance     Extremity/Trunk Assessment Upper Extremity Assessment Upper Extremity Assessment: RUE deficits/detail RUE Deficits / Details: generalized weakness; ROM WFLs but it took a while for pt to make a fist   Lower Extremity Assessment RLE Deficits / Details: R hip flexion limited since she had polio at age 64       Communication Communication Communication: No difficulties  Cognition Arousal/Alertness: Awake/alert (no recall of why she is in the hospital) Behavior During Therapy: [facility] for tasks assessed/performed Overall Cognitive Status: No family/caregiver present to determine baseline cognitive functioning                     General Comments       Exercises       Shoulder Instructions      Home Living Family/patient expects to be discharged to:: Other (Comment)                                 Additional Comments: per chart, d/c planned to inpatient psych unit.  Pt is from [facility]F      Prior Functioning/Environment Level of Independence: Needs  assistance        Comments: pt states staff is available to help her with everything        OT Problem List: Decreased strength;Decreased range of motion;Decreased activity tolerance;Impaired balance (sitting and/or standing);Decreased cognition;Pain   OT Treatment/Interventions: Self-care/ADL training;DME and/or AE instruction;Therapeutic activities;Patient/family education;Balance training    OT Goals(Current goals can be found in the care plan section) Acute Rehab OT Goals Patient Stated Goal: home to [facility] OT Goal Formulation: With patient (generally in agreement) Time For Goal Achievement: 04/17/16 ADL Goals Additional ADL Goal #1: pt will stand with min +2 assistance and maintain for 2 minutes with min guard for adls  OT Frequency: Min 2X/week   Barriers to D/C:            Co-evaluation              End of Session    Activity Tolerance: Patient tolerated treatment well Patient left: in chair;with call bell/phone within reach;with nursing/sitter in room   Time: 1412-1437 OT Time Calculation (min): 25 min Charges:  OT General Charges $OT Visit: 1 Procedure OT Evaluation $OT Eval Moderate Complexity: 1 Procedure G-Codes:    Leticia Coletta 2016/05/09, 2:56 PM  Marica Otter, OTR/L [phone] May 09, 2016

## 2016-04-11 DIAGNOSIS — Z8249 Family history of ischemic heart disease and other diseases of the circulatory system: Secondary | ICD-10-CM

## 2016-04-11 DIAGNOSIS — Z888 Allergy status to other drugs, medicaments and biological substances status: Secondary | ICD-10-CM

## 2016-04-11 DIAGNOSIS — F332 Major depressive disorder, recurrent severe without psychotic features: Secondary | ICD-10-CM

## 2016-04-11 DIAGNOSIS — Z880 Allergy status to penicillin: Secondary | ICD-10-CM

## 2016-04-11 DIAGNOSIS — Z882 Allergy status to sulfonamides status: Secondary | ICD-10-CM

## 2016-04-11 DIAGNOSIS — F4321 Adjustment disorder with depressed mood: Secondary | ICD-10-CM

## 2016-04-11 DIAGNOSIS — Z79899 Other long term (current) drug therapy: Secondary | ICD-10-CM

## 2016-04-11 LAB — BASIC METABOLIC PANEL
ANION GAP: 4 — AB (ref 5–15)
BUN: 25 mg/dL — ABNORMAL HIGH (ref 6–20)
CO2: 26 mmol/L (ref 22–32)
Calcium: 8.6 mg/dL — ABNORMAL LOW (ref 8.9–10.3)
Chloride: 101 mmol/L (ref 101–111)
Creatinine, Ser: 0.72 mg/dL (ref 0.44–1.00)
GLUCOSE: 107 mg/dL — AB (ref 65–99)
POTASSIUM: 4.1 mmol/L (ref 3.5–5.1)
Sodium: 131 mmol/L — ABNORMAL LOW (ref 135–145)

## 2016-04-11 MED ORDER — SODIUM CHLORIDE 0.9 % IV SOLN
INTRAVENOUS | Status: DC
  Administered 2016-04-11: 18:00:00 via INTRAVENOUS

## 2016-04-11 NOTE — NC FL2 (Addendum)
[place] MEDICAID FL2 LEVEL OF CARE SCREENING TOOL     IDENTIFICATION  Patient Name: Kathy Peterson Birthdate: [birth year] Sex: female Admission Date (Current Location): 04/07/2016  [facility] and [place] Number:  Producer, television/film/video and Address:  [facility],  [address] [place] [address]      Provider Number: [id number]  Attending Physician Name and Address:  Albertine Grates, MD  Relative Name and Phone Number:       Current Level of Care: Hospital Recommended Level of Care: Assisted Living Facility Prior Approval Number:    Date Approved/Denied:   PASRR Number:    Discharge Plan:  (Assited Living Facility)    Current Diagnoses: Patient Active Problem List   Diagnosis Date Noted  . MDD (major depressive disorder), recurrent severe, without psychosis (HCC) 04/08/2016  . COPD exacerbation (HCC) 04/08/2016  . History of pacemaker 04/08/2016  . QT prolongation 04/08/2016  . Episode of recurrent major depressive disorder (HCC)   . FTT (failure to thrive) in adult   . Hyponatremia 12/24/2015  . Rib fractures 12/23/2015  . Atrial fibrillation (HCC) 12/23/2015  . CHF (congestive heart failure) (HCC) 12/23/2015  . COPD (chronic obstructive pulmonary disease) (HCC) 12/23/2015  . Pre-diabetes 12/23/2015  . Essential (primary) hypertension 12/23/2015  . Hypothyroidism 12/23/2015  . Recurrent falls 12/23/2015    Orientation RESPIRATION BLADDER Height & Weight     Self  Normal Incontinent Weight: 163 lb 12.8 oz (74.3 kg) Height:  5\' 6"  (167.6 cm)  BEHAVIORAL SYMPTOMS/MOOD NEUROLOGICAL BOWEL NUTRITION STATUS      Incontinent Diet CCHO Mechanical Soft   AMBULATORY STATUS COMMUNICATION OF NEEDS Skin   Extensive Assist Verbally Normal                       Personal Care Assistance Level of Assistance  Bathing, Feeding, Dressing Bathing Assistance: Limited assistance Feeding assistance: Independent Dressing Assistance: Limited assistance     Functional  Limitations Info  Sight, Hearing, Speech Sight Info: Impaired Hearing Info: Adequate Speech Info: Adequate    SPECIAL CARE FACTORS FREQUENCY  PT (By licensed PT), OT (By licensed OT)     PT Frequency: 5 OT Frequency: 5            Contractures Contractures Info: Not present    Additional Factors Info  Code Status, Allergies, Psychotropic Code Status Info: Full Code Allergies Info: Penicillins, Pneumovax Pneumococcal Polysaccharide Vaccine, Statins, Sulfa Antibiotics Psychotropic Info: Yes         Current Medications (04/11/2016):  This is the current hospital active medication list Current Facility-Administered Medications  Medication Dose Route Frequency Provider Last Rate Last Dose  . 0.9 %  sodium chloride infusion   Intravenous Continuous 04/13/2016, MD 75 mL/hr at 04/11/16 0919    . acetaminophen (TYLENOL) tablet 500 mg  500 mg Oral BID 04/13/16, MD   500 mg at 04/11/16 1001  . albuterol (PROVENTIL) (2.5 MG/3ML) 0.083% nebulizer solution 2.5 mg  2.5 mg Nebulization Q4H PRN 04/13/16, MD   2.5 mg at 04/09/16 1403  . benzonatate (TESSALON) capsule 100 mg  100 mg Oral TID PRN 04/11/16, MD   100 mg at 04/09/16 1404  . cephALEXin (KEFLEX) capsule 250 mg  250 mg Oral Q12H 04/11/16, MD   250 mg at 04/11/16 1001  . diltiazem (DILACOR XR) 24 hr capsule 120 mg  120 mg Oral QHS 04/13/16, MD   120 mg at  04/10/16 2134  . DULoxetine (CYMBALTA) DR capsule 20 mg  20 mg Oral Daily Nira Conn, MD   20 mg at 04/11/16 1001  . enoxaparin (LOVENOX) injection 40 mg  40 mg Subcutaneous Q24H Tyrone Nine, MD   40 mg at 04/10/16 2134  . feeding supplement (ENSURE ENLIVE) (ENSURE ENLIVE) liquid 237 mL  237 mL Oral BID BM Tyrone Nine, MD   237 mL at 04/11/16 1001  . guaiFENesin (MUCINEX) 12 hr tablet 600 mg  600 mg Oral BID Albertine Grates, MD   600 mg at 04/11/16 1001  . ipratropium-albuterol (DUONEB) 0.5-2.5 (3) MG/3ML nebulizer solution 3 mL  3 mL  Nebulization BID Tyrone Nine, MD   3 mL at 04/10/16 2122  . levothyroxine (SYNTHROID, LEVOTHROID) tablet 100 mcg  100 mcg Oral QAC breakfast Albertine Grates, MD   100 mcg at 04/11/16 0919  . loratadine (CLARITIN) tablet 10 mg  10 mg Oral Daily Tyrone Nine, MD   10 mg at 04/11/16 1001  . losartan (COZAAR) tablet 25 mg  25 mg Oral Daily Nira Conn, MD   25 mg at 04/11/16 1001  . mometasone-formoterol (DULERA) 200-5 MCG/ACT inhaler 2 puff  2 puff Inhalation BID Tyrone Nine, MD   2 puff at 04/10/16 2122  . MUSCLE RUB CREA   Topical TID Tyrone Nine, MD      . pantoprazole (PROTONIX) EC tablet 20 mg  20 mg Oral Daily Tyrone Nine, MD   20 mg at 04/11/16 1001  . predniSONE (DELTASONE) tablet 50 mg  50 mg Oral Q breakfast Tyrone Nine, MD   50 mg at 04/11/16 0919  . psyllium (HYDROCIL/METAMUCIL) packet 1 packet  1 packet Oral QHS Tyrone Nine, MD   1 packet at 04/09/16 2143  . sodium chloride flush (NS) 0.9 % injection 3 mL  3 mL Intravenous Q12H Tyrone Nine, MD   3 mL at 04/10/16 2136  . traMADol (ULTRAM) tablet 25 mg  25 mg Oral Q8H PRN Nira Conn, MD   25 mg at 04/10/16 1947  . traZODone (DESYREL) tablet 25 mg  25 mg Oral QHS Aleda Grana, RPH   25 mg at 04/10/16 2134     Discharge Medications: Please see discharge summary for a list of discharge medications.  Relevant Imaging Results:  Relevant Lab Results:   Additional Information ss#[id number]  Clearance Coots, LCSW

## 2016-04-11 NOTE — Progress Notes (Addendum)
Physical Therapy Treatment Patient Details Name: Kathy Peterson MRN: [id number] DOB: Jan 23, 1927 Today's Date: 04/11/2016    History of Present Illness 82 y.o. female admitted from ALF with COPD, suicidal ideation, UTI. PMH of a fib, CHF, falls, depression, DM, pacemaker. Pt reports h/o polio which affected RLE at age 36.     PT Comments    Pt found in bed incont urine from shoulders to knees.  Sitter in room.  Assisted OOB + 2 assist to amb to bathroom.  Very unsteady gait with fix R hip extension and fixed R knee extension s/p Polio.  Once pt is upright on feet, she is able to amb but demonstrates poor balance and poor self correction to midline.  Sit to stand and stand to sit is very difficult for her.  Assisted to elevated toilet and assisted with hygiene.  Tolerated amb from bathroom to recliner + 2 assist.  Positioned in recliner to comfort.    Follow Up Recommendations  LPT rec SNF Per SW her ALF is able to care for her at current level Now pt plans to D/C back to [facility] Will update LPT     Equipment Recommendations       Recommendations for Other Services       Precautions / Restrictions Precautions Precautions: Fall Precaution Comments: limited R hip flexion and knee flexion 2* polio Restrictions Weight Bearing Restrictions: No    Mobility  Bed Mobility Overal bed mobility: +2 for physical assistance;Needs Assistance Bed Mobility: Supine to Sit     Supine to sit: +2 for physical assistance     General bed mobility comments: pt moved legs over EOB, used HOB raised to assist, and pt needed assistance with trunk  Transfers Overall transfer level: Needs assistance Equipment used: Rolling walker (2 wheeled) Transfers: Sit to/from Stand Sit to Stand: +2 physical assistance;+2 safety/equipment;Max assist         General transfer comment: assist to rise, pt with very limited R hip flexion and maintains trunk in extended position in  sitting  Ambulation/Gait Ambulation/Gait assistance: Mod assist;+2 physical assistance Ambulation Distance (Feet): 18 Feet (6 feet then another 12 feet) Assistive device: Rolling walker (2 wheeled) Gait Pattern/deviations: Step-to pattern Gait velocity: decreased   General Gait Details: very unsteady gait requiring + 2 assist for safety.  Limited activity tolerance with noted 2/4 dyspnea.     Stairs            Wheelchair Mobility    Modified Rankin (Stroke Patients Only)       Balance                                    Cognition Arousal/Alertness: Awake/alert Behavior During Therapy: WFL for tasks assessed/performed Overall Cognitive Status: Within Functional Limits for tasks assessed                      Exercises      General Comments        Pertinent Vitals/Pain Pain Assessment: Faces Pain Location: R side Pain Descriptors / Indicators: Aching Pain Intervention(s): Monitored during session    Home Living                      Prior Function            PT Goals (current goals can now be found in the care plan section) Progress towards  PT goals: Progressing toward goals    Frequency    Min 3X/week      PT Plan Current plan remains appropriate    Co-evaluation             End of Session Equipment Utilized During Treatment: Gait belt Activity Tolerance: Patient tolerated treatment well Patient left: in chair;with nursing/sitter in room     Time: 1134-1159 PT Time Calculation (min) (ACUTE ONLY): 25 min  Charges:  $Gait Training: 8-22 mins $Therapeutic Activity: 8-22 mins                    G Codes:      Felecia Shelling  PTA [facility]  Acute  Rehab Pager      [phone]  I agree with the above DC recommendations.  Ralene Bathe Kistler PT 04/13/2016  [phone]

## 2016-04-11 NOTE — Progress Notes (Signed)
PROGRESS NOTE  Kathy Peterson BSW:[id number] DOB: 10/13/1926 DOA: 04/07/2016 PCP: [name], MD  HPI/Recap of past 24 hours:  Report feeling better,  On room air   Assessment/Plan: Principal Problem:   COPD exacerbation (HCC) Active Problems:   Atrial fibrillation (HCC)   CHF (congestive heart failure) (HCC)   Essential (primary) hypertension   Hypothyroidism   Recurrent falls   Hyponatremia   FTT (failure to thrive) in adult   MDD (major depressive disorder), recurrent severe, without psychosis (HCC)   History of pacemaker   QT prolongation   Acute mild COPD/asthma exacerbation: ?viral URI trigger w/oropharyngeal erythema, tender cervical lymphadenopathy. CXR without consolidation or pulmonary edema, no fever. Euvolemic.  - RVP + rhinovirus -abx,  Steroids x 5 days, continue home breathing treatments, duonebs q6h, q3h prn.  -COPD gold protocol order set utilized  Suicidal ideation/MDD: UDS negative. EtOH negative.  - better, she is cleared by psychiatry to discharge back to her previous facility  UTI:  Did report dysuria, no leukocytosis, no fever Started on keflex in ED for symptoms + many bacteria, 6-30 WBCs, culture needs to be recollected.  -  Continue keflex x 7 days  Hyponatremia:  Appears chronic, she has been on trazodoen/cymbalta chronically, she is also on low dose lasix daily, euvolemic to dry - d/c lasix, gentle hydration, -repeat na in am, remain low, continue ivf, free water restriction  History of AFib, QT prolongation s/p PPM: Cardiology consulted by EDP for clearance prior to transfer. Device functioning properly - No acute issues per cardiology, will follow up as outpatient - Telemetry  History of diabetes: HbA1c 6% in Aug 2017.  - Measure CBG with labs   Hypothyroidism: Chronic, stable. TSH in ED 4.546.  - increase synthroid from daily to 100 mcg daily  DVT prophylaxis: Lovenox  Code Status: Full  Family Communication: None  at bedside Disposition Plan: return to previous facility , likely on 11/25 Consults called: Cardiology , psychiatry   Procedures:  none  Antibiotics:  keflex   Objective: BP (!) 135/49 (BP Location: Right Arm)   Pulse 83   Temp 97.5 F (36.4 C) (Axillary)   Resp 16   Ht 5\' 6"  (1.676 m)   Wt 74.3 kg (163 lb 12.8 oz)   LMP  (LMP Unknown)   SpO2 98%   BMI 26.44 kg/m   Intake/Output Summary (Last 24 hours) at 04/11/16 1512 Last data filed at 04/11/16 1122  Gross per 24 hour  Intake             2110 ml  Output                0 ml  Net             2110 ml   Filed Weights   04/08/16 1715 04/10/16 0557 04/11/16 0500  Weight: 67.2 kg (148 lb 2.4 oz) 71.7 kg (158 lb 1.1 oz) 74.3 kg (163 lb 12.8 oz)    Exam:   General:  Much more pleasant  Cardiovascular: RRR  Respiratory: previously heard wheezing has resolved  Abdomen: Soft/ND/NT, positive BS  Musculoskeletal: No Edema  Neuro: slight confusion, likely baseline  Data Reviewed: Basic Metabolic Panel:  Recent Labs Lab 04/07/16 1450 04/08/16 1842 04/09/16 0519 04/10/16 0501 04/11/16 0521  NA 129*  --  128* 127* 131*  K 4.0  --  4.5 4.9 4.1  CL 97*  --  93* 96* 101  CO2 25  --  26 25 26  GLUCOSE 109*  --  184* 158* 107*  BUN 11  --  21* 27* 25*  CREATININE 0.59 0.80 0.80 0.70 0.72  CALCIUM 9.0  --  9.5 9.0 8.6*  MG  --   --   --  1.9  --    Liver Function Tests:  Recent Labs Lab 04/07/16 1450  AST 15  ALT 11*  ALKPHOS 71  BILITOT 0.5  PROT 6.2*  ALBUMIN 3.8   No results for input(s): LIPASE, AMYLASE in the last 168 hours. No results for input(s): AMMONIA in the last 168 hours. CBC:  Recent Labs Lab 04/07/16 1450 04/08/16 1842 04/09/16 0519  WBC 6.4 7.7 4.6  NEUTROABS 3.5  --   --   HGB 13.0 13.1 12.8  HCT 37.7 37.4 37.0  MCV 82.5 81.5 82.6  PLT 263 259 263   Cardiac Enzymes:   No results for input(s): CKTOTAL, CKMB, CKMBINDEX, TROPONINI in the last 168 hours. BNP (last 3  results)  Recent Labs  12/23/15 1710 01/25/16 1259  BNP 855.7* 493.6*    ProBNP (last 3 results) No results for input(s): PROBNP in the last 8760 hours.  CBG: No results for input(s): GLUCAP in the last 168 hours.  Recent Results (from the past 240 hour(s))  Urine culture     Status: Abnormal   Collection Time: 04/07/16  2:12 PM  Result Value Ref Range Status   Specimen Description URINE, CLEAN CATCH  Final   Special Requests NONE  Final   Culture MULTIPLE SPECIES PRESENT, SUGGEST RECOLLECTION (A)  Final   Report Status 04/08/2016 FINAL  Final  Respiratory Panel by PCR     Status: Abnormal   Collection Time: 04/08/16  7:56 PM  Result Value Ref Range Status   Adenovirus NOT DETECTED NOT DETECTED Final   Coronavirus 229E NOT DETECTED NOT DETECTED Final   Coronavirus HKU1 NOT DETECTED NOT DETECTED Final   Coronavirus NL63 NOT DETECTED NOT DETECTED Final   Coronavirus OC43 NOT DETECTED NOT DETECTED Final   Metapneumovirus NOT DETECTED NOT DETECTED Final   Rhinovirus / Enterovirus DETECTED (A) NOT DETECTED Final   Influenza A NOT DETECTED NOT DETECTED Final   Influenza B NOT DETECTED NOT DETECTED Final   Parainfluenza Virus 1 NOT DETECTED NOT DETECTED Final   Parainfluenza Virus 2 NOT DETECTED NOT DETECTED Final   Parainfluenza Virus 3 NOT DETECTED NOT DETECTED Final   Parainfluenza Virus 4 NOT DETECTED NOT DETECTED Final   Respiratory Syncytial Virus NOT DETECTED NOT DETECTED Final   Bordetella pertussis NOT DETECTED NOT DETECTED Final   Chlamydophila pneumoniae NOT DETECTED NOT DETECTED Final   Mycoplasma pneumoniae NOT DETECTED NOT DETECTED Final    Comment: Performed at [facility]     Studies: No results found.  Scheduled Meds: . acetaminophen  500 mg Oral BID  . cephALEXin  250 mg Oral Q12H  . diltiazem  120 mg Oral QHS  . DULoxetine  20 mg Oral Daily  . enoxaparin (LOVENOX) injection  40 mg Subcutaneous Q24H  . feeding supplement (ENSURE ENLIVE)  237  mL Oral BID BM  . guaiFENesin  600 mg Oral BID  . ipratropium-albuterol  3 mL Nebulization BID  . levothyroxine  100 mcg Oral QAC breakfast  . loratadine  10 mg Oral Daily  . losartan  25 mg Oral Daily  . mometasone-formoterol  2 puff Inhalation BID  . MUSCLE RUB   Topical TID  . pantoprazole  20 mg Oral Daily  . predniSONE  50 mg Oral Q breakfast  . psyllium  1 packet Oral QHS  . sodium chloride flush  3 mL Intravenous Q12H  . traZODone  25 mg Oral QHS    Continuous Infusions:    Time spent: 25 mins  Alette Kataoka MD, PhD  [facility] Pager [phone]. If 7PM-7AM, please contact night-coverage at [email or web address], password [facility] 04/11/2016, 3:12 PM  LOS: 2 days

## 2016-04-11 NOTE — Clinical Social Work Note (Signed)
Clinical Social Work Assessment  Patient Details  Name: Kathy Peterson MRN: [id number] Date of Birth: Mar 20, 1927  Date of referral:  04/11/16               Reason for consult:  Facility Placement, Mental Health Concerns                Permission sought to share information with:  Facility Sport and exercise psychologist, Family Supports Permission granted to share information::     Name::      Kathy Peterson  Agency::   [employer]  Relationship::   Daughter in [place]:   [phone]  Housing/Transportation Living arrangements for the past 2 months:  Pillager of Information:  Patient Patient Interpreter Needed:  None Criminal Activity/Legal Involvement Pertinent to Current Situation/Hospitalization:  No - Comment as needed Significant Relationships:  None Lives with:  Self Do you feel safe going back to the place where you live?  Yes Need for family participation in patient care:  Yes (Comment)  Care giving concerns: Patient is from ALF and presented to ED with suicidal ideation urinary frequency and dsyuria.     Social Worker assessment / plan:  LCSWA and psychiatrist met with patient at bedside. Patient agreeable to talk. Patient reports she has been feeling a lot better and denies SI/HI. Patient reports she is ready to go back "home"([place]). Patient reports she has been having difficulty sleeping due to her coughing. Patient gave LCSWA permission to contact family for collateral information.  Patient daughter in law [name] reports patient has a hx of being seen by the facility Psychiatrist. She reports when the family went to see patient at facility, pt. Was triggered by learning that her daughter in law mother was living in the home. The patient became very upset and started to endorse SI.  The family expects the patient to return to ALF once medically stable.  LCSWA spoke with Lannette Donath at [facility] and she reports she will need to  complete face to face assessment prior to readmission. The facility reports they can assist 2+ and accommodate patient.     Employment status:    Insurance information:  Catering manager) PT Recommendations:  24 Hour Supervision, [place] / Referral to community resources:     Patient/Family's Response to care: Agreeable for patient to return to ALF.   Patient/Family's Understanding of and Emotional Response to Diagnosis, Current Treatment, and Prognosis: Family wants patient to continue care a [facility].   Emotional Assessment Appearance:  Appears stated age Attitude/Demeanor/Rapport:    Affect (typically observed):  Accepting, Calm Orientation:  Oriented to Self Alcohol / Substance use:  Not Applicable Psych involvement (Current and /or in the community):  Yes (Comment) (Recommend to Return ALF)  Discharge Needs  Concerns to be addressed:  Discharge Planning Concerns, Care Coordination Readmission within the last 30 days:  No Current discharge risk:  None Barriers to Discharge:  Continued Medical Work up   [employer], LCSW 04/11/2016, 2:19 PM

## 2016-04-11 NOTE — Consult Note (Signed)
**Note Kathy-Identified via Obfuscation** [facility] Consult   Reason for Consult:  Suicidal ideation Referring Physician:  Dr. Erlinda Hong Patient Identification: Kathy Peterson MRN:  [id number] Principal Diagnosis: COPD exacerbation [facility]) Diagnosis:   Patient Active Problem List   Diagnosis Date Noted  . MDD (major depressive disorder), recurrent severe, without psychosis ([place]) [F33.2] 04/08/2016  . COPD exacerbation ([place]) [J44.1] 04/08/2016  . History of pacemaker [Z95.0] 04/08/2016  . QT prolongation [R94.31] 04/08/2016  . Episode of recurrent major depressive disorder ([place]) [F33.9]   . FTT (failure to thrive) in adult [R62.7]   . Hyponatremia [E87.1] 12/24/2015  . Rib fractures [S22.39XA] 12/23/2015  . Atrial fibrillation ([place]) [I48.91] 12/23/2015  . CHF (congestive heart failure) ([place]) [I50.9] 12/23/2015  . COPD (chronic obstructive pulmonary disease) ([place]) [J44.9] 12/23/2015  . Pre-diabetes [R73.03] 12/23/2015  . Essential (primary) hypertension [I10] 12/23/2015  . Hypothyroidism [E03.9] 12/23/2015  . Recurrent falls [R29.6] 12/23/2015    Total Time spent with patient: 1 hour  Subjective:   Kathy Peterson is a 82 y.o. female patient admitted with suicidal ideation.  HPI:  Kathy Peterson is a 82 y.o. female, seen, chart reviewed and case discussed with the unit LCSW for this face-to-face psychiatric consultation and evaluation of suicidal ideation. Patient is awake, alert, oriented 3. Patient reported she has been suffering with multiple medical problems including polio and she could not walk without walking aid. Patient denies current symptoms of depression, anxiety, auditory/visual hallucinations, delusions or paranoia. Patient reportedly staying in assisted living facility called "[facility]". Patient has been physically sick, generalized weakness, tired, urinary tract infection and required appropriate medication therapy. Patient provided information regarding her daughter-in-law and son who can provide the  collateral information if needed. Patient denies current suicidal/homicidal ideation, intention or plans. Patient contract for safety. Patient stated that sometimes people are nice to her which makes her become depressed and suicidal.   Medical history: Patient with a history of COPD, MDD, failure to thrive, AFib s/p PPM, DM, hypothyroidism who was brought from ALF on 11/20 due to suicidal ideation and UTI symptoms. She was evaluated in the ED for psychiatric complaints, started on keflex for UTI and was awaiting inpatient psych placement. On 11/21 she developed wheezing and rhonchi which she reports occurs intermittently. Onset was gradual over hours, now with nonproductive cough, no fever, no chest pain, and no dyspnea. She has associated sore throat but no rhinorrhea, or congestion. Nebulizer treatments helped very little. She was given steroids. CXR on arrival had been negative and she had no leukocytosis and remained afebrile. TR[facility] was asked to admit for COPD exacerbation  Past Psychiatric History: Major depressive disorder and failure to thrive and hypothyroidism. Patient has no previous acute psychiatric hospitalization.  Risk to Self: Suicidal Ideation: Yes-Currently Present Suicidal Intent: No Is patient at risk for suicide?: Yes Suicidal Plan?: Yes-Currently Present Specify Current Suicidal Plan: Fall out of bed and break her neck Access to Means: No What has been your use of drugs/alcohol within the last 12 months?: denies How many times?: 0 Other Self Harm Risks: None Triggers for Past Attempts: Unknown Intentional Self Injurious Behavior: None Risk to Others: Homicidal Ideation: No Thoughts of Harm to Others: No Current Homicidal Intent: No Current Homicidal Plan: No Access to Homicidal Means: No Identified Victim: na History of harm to others?: No Assessment of Violence: None Noted Violent Behavior Description: na Does patient have access to weapons?: No Criminal Charges  Pending?: No Does patient have a court date: No Prior Inpatient Therapy: Prior Inpatient  Therapy: No Prior Therapy Dates: na Prior Therapy Facilty/Provider(s): na Reason for Treatment: na Prior Outpatient Therapy: Prior Outpatient Therapy: No Prior Therapy Dates: na Prior Therapy Facilty/Provider(s): na Reason for Treatment: na Does patient have an ACCT team?: No Does patient have Intensive In-House Services?  : No Does patient have [facility] services? : No Does patient have P4CC services?: No  Past Medical History:  Past Medical History:  Diagnosis Date  . Arthritis    rheumatoid  . CHF (congestive heart failure) ([place])   . COPD (chronic obstructive pulmonary disease) ([place])   . Depression   . Diabetes mellitus without complication ([place])    type 2  . Failure to thrive in adult   . Frequent falls   . GERD (gastroesophageal reflux disease)   . Hypertension   . Hyponatremia   . Orthostatic hypotension   . Pacemaker   . PAF (paroxysmal atrial fibrillation) ([place])    a. ? mentioned in notes, no further details available.  . Pneumonia   . Polio   . Secondhand smoke exposure   . Shortness of breath   . Thyroid disease    hypothyroid    Past Surgical History:  Procedure Laterality Date  . ABDOMINAL HYSTERECTOMY    . CHOLECYSTECTOMY    . EYE SURGERY     Family History:  Family History  Problem Relation Age of Onset  . Heart disease Mother   . Stroke Neg Hx   . Diabetes Neg Hx   . Cancer Neg Hx    Family Psychiatric  History: Patient denied family history of mental illness. Social History:  History  Alcohol Use No     History  Drug Use No    Social History   Social History  . Marital status: Widowed    Spouse name: N/A  . Number of children: N/A  . Years of education: N/A   Occupational History  . retired    Social History Main Topics  . Smoking status: Never Smoker  . Smokeless tobacco: Never Used  . Alcohol use No  . Drug use: No  . Sexual activity:  No   Other Topics Concern  . None   Social History Narrative  . None   Additional Social History:    Allergies:   Allergies  Allergen Reactions  . Penicillins Other (See Comments)    Reaction:  Unknown  Has patient had a PCN reaction causing immediate rash, facial/tongue/throat swelling, SOB or lightheadedness with hypotension: Unsure Has patient had a PCN reaction causing severe rash involving mucus membranes or skin necrosis: Unsure Has patient had a PCN reaction that required hospitalization Unsure Has patient had a PCN reaction occurring within the last 10 years: Unsure If all of the above answers are "NO", then may proceed with Cephalosporin use.  . Pneumovax [Pneumococcal Polysaccharide Vaccine] Other (See Comments)    Reaction:  Unknown   . Statins Other (See Comments)    Reaction:  Unknown   . Sulfa Antibiotics Other (See Comments)    Reaction:  Unknown     Labs:  Results for orders placed or performed during the hospital encounter of 04/07/16 (from the past 48 hour(s))  Basic metabolic panel     Status: Abnormal   Collection Time: 04/10/16  5:01 AM  Result Value Ref Range   Sodium 127 (L) 135 - 145 mmol/L   Potassium 4.9 3.5 - 5.1 mmol/L   Chloride 96 (L) 101 - 111 mmol/L   CO2 25 22 - 32  mmol/L   Glucose, Bld 158 (H) 65 - 99 mg/dL   BUN 27 (H) 6 - 20 mg/dL   Creatinine, Ser 0.70 0.44 - 1.00 mg/dL   Calcium 9.0 8.9 - 10.3 mg/dL   GFR calc non Af Amer >60 >60 mL/min   GFR calc Af Amer >60 >60 mL/min    Comment: (NOTE) The eGFR has been calculated using the CKD EPI equation. This calculation has not been validated in all clinical situations. eGFR's persistently <60 mL/min signify possible Chronic Kidney Disease.    Anion gap 6 5 - 15  Magnesium     Status: None   Collection Time: 04/10/16  5:01 AM  Result Value Ref Range   Magnesium 1.9 1.7 - 2.4 mg/dL  Basic metabolic panel     Status: Abnormal   Collection Time: 04/11/16  5:21 AM  Result Value Ref  Range   Sodium 131 (L) 135 - 145 mmol/L   Potassium 4.1 3.5 - 5.1 mmol/L   Chloride 101 101 - 111 mmol/L   CO2 26 22 - 32 mmol/L   Glucose, Bld 107 (H) 65 - 99 mg/dL   BUN 25 (H) 6 - 20 mg/dL   Creatinine, Ser 0.72 0.44 - 1.00 mg/dL   Calcium 8.6 (L) 8.9 - 10.3 mg/dL   GFR calc non Af Amer >60 >60 mL/min   GFR calc Af Amer >60 >60 mL/min    Comment: (NOTE) The eGFR has been calculated using the CKD EPI equation. This calculation has not been validated in all clinical situations. eGFR's persistently <60 mL/min signify possible Chronic Kidney Disease.    Anion gap 4 (L) 5 - 15    Current Facility-Administered Medications  Medication Dose Route Frequency Provider Last Rate Last Dose  . 0.9 %  sodium chloride infusion   Intravenous Continuous Florencia Reasons, MD      . acetaminophen (TYLENOL) tablet 500 mg  500 mg Oral BID Fatima Blank, MD   500 mg at 04/11/16 1001  . albuterol (PROVENTIL) (2.5 MG/3ML) 0.083% nebulizer solution 2.5 mg  2.5 mg Nebulization Q4H PRN Patrecia Pour, MD   2.5 mg at 04/09/16 1403  . benzonatate (TESSALON) capsule 100 mg  100 mg Oral TID PRN Patrecia Pour, MD   100 mg at 04/09/16 1404  . cephALEXin (KEFLEX) capsule 250 mg  250 mg Oral Q12H Fatima Blank, MD   250 mg at 04/11/16 1001  . diltiazem (DILACOR XR) 24 hr capsule 120 mg  120 mg Oral QHS Fatima Blank, MD   120 mg at 04/10/16 2134  . DULoxetine (CYMBALTA) DR capsule 20 mg  20 mg Oral Daily Fatima Blank, MD   20 mg at 04/11/16 1001  . enoxaparin (LOVENOX) injection 40 mg  40 mg Subcutaneous Q24H Patrecia Pour, MD   40 mg at 04/10/16 2134  . feeding supplement (ENSURE ENLIVE) (ENSURE ENLIVE) liquid 237 mL  237 mL Oral BID BM Patrecia Pour, MD   237 mL at 04/11/16 1001  . guaiFENesin (MUCINEX) 12 hr tablet 600 mg  600 mg Oral BID Florencia Reasons, MD   600 mg at 04/11/16 1001  . ipratropium-albuterol (DUONEB) 0.5-2.5 (3) MG/3ML nebulizer solution 3 mL  3 mL Nebulization BID Patrecia Pour, MD   3  mL at 04/10/16 2122  . levothyroxine (SYNTHROID, LEVOTHROID) tablet 100 mcg  100 mcg Oral QAC breakfast Florencia Reasons, MD   100 mcg at 04/11/16 0919  . loratadine (CLARITIN) tablet  10 mg  10 mg Oral Daily Patrecia Pour, MD   10 mg at 04/11/16 1001  . losartan (COZAAR) tablet 25 mg  25 mg Oral Daily Fatima Blank, MD   25 mg at 04/11/16 1001  . mometasone-formoterol (DULERA) 200-5 MCG/ACT inhaler 2 puff  2 puff Inhalation BID Patrecia Pour, MD   2 puff at 04/10/16 2122  . MUSCLE RUB CREA   Topical TID Patrecia Pour, MD      . pantoprazole (PROTONIX) EC tablet 20 mg  20 mg Oral Daily Patrecia Pour, MD   20 mg at 04/11/16 1001  . predniSONE (DELTASONE) tablet 50 mg  50 mg Oral Q breakfast Patrecia Pour, MD   50 mg at 04/11/16 0919  . psyllium (HYDROCIL/METAMUCIL) packet 1 packet  1 packet Oral QHS Patrecia Pour, MD   1 packet at 04/09/16 2143  . sodium chloride flush (NS) 0.9 % injection 3 mL  3 mL Intravenous Q12H Patrecia Pour, MD   3 mL at 04/10/16 2136  . traMADol (ULTRAM) tablet 25 mg  25 mg Oral Q8H PRN Fatima Blank, MD   25 mg at 04/10/16 1947  . traZODone (DESYREL) tablet 25 mg  25 mg Oral QHS Berton Mount, RPH   25 mg at 04/10/16 2134    Musculoskeletal: Strength & Muscle Tone: decreased Gait & Station: unable to stand Patient leans: N/A  Psychiatric Specialty Exam: Physical Exam  ROS  Blood pressure (!) 135/49, pulse 83, temperature 97.5 F (36.4 C), temperature source Axillary, resp. rate 16, height '5\' 6"'  (1.676 m), weight 74.3 kg (163 lb 12.8 oz), SpO2 98 %.Body mass index is 26.44 kg/m.  General Appearance: Casual  Eye Contact:  Good  Speech:  Clear and Coherent  Volume:  Normal  Mood:  Depressed  Affect:  Constricted and Depressed  Thought Process:  Coherent and Goal Directed  Orientation:  Full (Time, Place, and Person)  Thought Content:  WDL  Suicidal Thoughts:  No  Homicidal Thoughts:  No  Memory:  Immediate;   Good Recent;   Fair Remote;   Fair   Judgement:  Intact  Insight:  Fair  Psychomotor Activity:  Decreased  Concentration:  Concentration: Good and Attention Span: Fair  Recall:  Good  Fund of Knowledge:  Good  Language:  Good  Akathisia:  Negative  Handed:  Right  AIMS (if indicated):     Assets:  Communication Skills Desire for Improvement Financial Resources/Insurance Housing Intimacy Leisure Time Resilience Social Support Transportation  ADL's:  Impaired  Cognition:  WNL  Sleep:        Treatment Plan Summary: 82 years old female from assisted living facility presented with few symptoms of depression and passive suicidal ideation but denied active symptoms of depression, anxiety, psychosis and suicidal/homicidal ideation, intention or plans. Patient does not meet criteria for acute psychiatric hospitalization.  Patient will be referred to the outpatient medication management.  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  Appreciate psychiatric consultation and we sign off as of today Please contact [phone] or [phone] if needs further assistance  Disposition: Patient does not meet criteria for psychiatric inpatient admission. Supportive therapy provided about ongoing stressors.  Ambrose Finland, MD 04/11/2016 4:13 PM

## 2016-04-12 LAB — BASIC METABOLIC PANEL
ANION GAP: 6 (ref 5–15)
BUN: 26 mg/dL — ABNORMAL HIGH (ref 6–20)
CHLORIDE: 99 mmol/L — AB (ref 101–111)
CO2: 25 mmol/L (ref 22–32)
Calcium: 8.6 mg/dL — ABNORMAL LOW (ref 8.9–10.3)
Creatinine, Ser: 0.62 mg/dL (ref 0.44–1.00)
GFR calc Af Amer: >60 mL/min (ref >60)
GLUCOSE: 122 mg/dL — AB (ref 65–99)
POTASSIUM: 4.5 mmol/L (ref 3.5–5.1)
Sodium: 130 mmol/L — ABNORMAL LOW (ref 135–145)

## 2016-04-12 MED ORDER — TRAMADOL HCL 50 MG PO TABS: 25.0000 mg | ORAL_TABLET | Freq: Three times a day (TID) | ORAL | 0 refills | Status: DC | PRN

## 2016-04-12 MED ORDER — FUROSEMIDE 20 MG PO TABS: 10.0000 mg | ORAL_TABLET | Freq: Every day | ORAL | 0 refills | Status: DC | PRN

## 2016-04-12 MED ORDER — SODIUM CHLORIDE 0.9 % IV BOLUS (SEPSIS)
1000.0000 mL | Freq: Once | INTRAVENOUS | Status: AC
  Administered 2016-04-12: 1000 mL via INTRAVENOUS

## 2016-04-12 MED ORDER — LEVOTHYROXINE SODIUM 100 MCG PO TABS: 100.0000 ug | ORAL_TABLET | Freq: Every day | ORAL | 0 refills | Status: DC

## 2016-04-12 MED ORDER — GUAIFENESIN ER 600 MG PO TB12: 600.0000 mg | ORAL_TABLET | Freq: Two times a day (BID) | ORAL | 0 refills | Status: DC

## 2016-04-12 MED ORDER — ENSURE ENLIVE PO LIQD: 237.0000 mL | Freq: Two times a day (BID) | ORAL | 12 refills | Status: AC

## 2016-04-12 NOTE — Discharge Summary (Signed)
Discharge Summary  Kathy Peterson URK:[id number] DOB: 1927/03/11  PCP: [name], MD  Admit date: 04/07/2016 Discharge date: 04/12/2016  Time spent: >57mins  Recommendations for Outpatient Follow-up:  1. F/u with PMD within a week  for hospital discharge follow up, repeat cbc/bmp at follow up, pmd to monitor sodium level 2. Avoid excessive free water intake, encourage gatorade instead  Discharge Diagnoses:  Active Hospital Problems   Diagnosis Date Noted  . COPD exacerbation (HCC) 04/08/2016  . MDD (major depressive disorder), recurrent severe, without psychosis (HCC) 04/08/2016  . History of pacemaker 04/08/2016  . QT prolongation 04/08/2016  . FTT (failure to thrive) in adult   . Hyponatremia 12/24/2015  . CHF (congestive heart failure) (HCC) 12/23/2015  . Atrial fibrillation (HCC) 12/23/2015  . Essential (primary) hypertension 12/23/2015  . Hypothyroidism 12/23/2015  . Recurrent falls 12/23/2015    Resolved Hospital Problems   Diagnosis Date Noted Date Resolved  No resolved problems to display.    Discharge Condition: stable  Diet recommendation: regular diet  Filed Weights   04/10/16 0557 04/11/16 0500 04/12/16 0500  Weight: 71.7 kg (158 lb 1.1 oz) 74.3 kg (163 lb 12.8 oz) 74.5 kg (164 lb 3.9 oz)    History of present illness:  Patient coming from: [facility]  Chief Complaint: "wanted to fall and break her neck and die," urinary frequency, and dysuria.  HPI: Kathy Peterson is a 82 y.o. female with a history of COPD, MDD, failure to thrive, AFib s/p PPM, DM, hypothyroidism who was brought from ALF on 11/20 due to suicidal ideation and UTI symptoms. She was evaluated in the ED for psychiatric complaints, started on keflex for UTI and was awaiting inpatient psych placement. On 11/21 she developed wheezing and rhonchi which she reports occurs intermittently. Onset was gradual over hours, now with nonproductive cough, no fever, no chest pain, and no dyspnea. She has  associated sore throat but no rhinorrhea, or congestion. Nebulizer treatments helped very little. She was given steroids. CXR on arrival had been negative and she had no leukocytosis and remained afebrile. [facility] was asked to admit for COPD exacerbation.   Hospital Course:  Principal Problem:   COPD exacerbation (HCC) Active Problems:   Atrial fibrillation (HCC)   CHF (congestive heart failure) (HCC)   Essential (primary) hypertension   Hypothyroidism   Recurrent falls   Hyponatremia   FTT (failure to thrive) in adult   MDD (major depressive disorder), recurrent severe, without psychosis (HCC)   History of pacemaker   QT prolongation   Acute mildCOPD/asthmaexacerbation:?viral URI trigger w/oropharyngeal erythema, tender cervical lymphadenopathy. CXR without consolidation or pulmonary edema, no fever. Euvolemic.  - RVP + rhinovirus -she finished abx,  Steroids x 5 days, continue home breathing treatments, duonebs q6h, q3h prn.  -COPD gold protocol order set utilized  Suicidal ideation/MDD: UDS negative. EtOH negative.  - better, she is cleared by psychiatry to discharge back to her previous facility  UTI:  Did report dysuria, no leukocytosis, no fever Started on keflex in ED for symptoms + many bacteria, 6-30 WBCs, culture with multiple species -  finished abx treatment with keflex   Hyponatremia:  Appears chronic, she has been on trazodoen/cymbalta chronically, she is also on low dose lasix daily, euvolemic to dry - d/c lasix, gentle hydration, -improved, she is advised to avoid free water , lasix changed to prn for edema, pmd to continue to monitor sodium level  History of AFib, QT prolongation s/p PPM: Cardiology consulted by EDP for clearance prior  to transfer. Device functioning properly - No acute issues per cardiology, will follow up as outpatient   History of diabetes: HbA1c 6% in Aug 2017.  - Measure CBG with labs   Hypothyroidism: Chronic, stable. TSH in ED  4.546.  - increase synthroid from 75mcg daily to 100 mcg daily  DVT prophylaxis while in the hospital:Lovenox Code Status:Full Family Communication:None at bedside Disposition Plan:return to previous facility ([facility] on 11/25 Consults called:Cardiology , psychiatry   Procedures:  none  Antibiotics:  keflex   Discharge Exam: BP (!) 145/61 (BP Location: Right Arm)   Pulse 82   Temp 98.1 F (36.7 C) (Oral)   Resp 18   Ht 5\' 6"  (1.676 m)   Wt 74.5 kg (164 lb 3.9 oz)   LMP  (LMP Unknown)   SpO2 96%   BMI 26.51 kg/m     General:   pleasant  Cardiovascular: RRR  Respiratory: previously heard wheezing has resolved, she does has upper airway sounds  Abdomen: Soft/ND/NT, positive BS  Musculoskeletal: No Edema  Neuro: slight confusion, likely baseline   Discharge Instructions You were cared for by a hospitalist during your hospital stay. If you have any questions about your discharge medications or the care you received while you were in the hospital after you are discharged, you can call the unit and asked to speak with the hospitalist on call if the hospitalist that took care of you is not available. Once you are discharged, your primary care physician will handle any further medical issues. Please note that NO REFILLS for any discharge medications will be authorized once you are discharged, as it is imperative that you return to your primary care physician (or establish a relationship with a primary care physician if you do not have one) for your aftercare needs so that they can reassess your need for medications and monitor your lab values.  Discharge Instructions    Diet general    Complete by:  As directed    Try to avoid excessive free water, encourage gatorade instead   Increase activity slowly    Complete by:  As directed        Medication List    STOP taking these medications   loperamide 2 MG tablet Commonly known as:  IMODIUM A-D      TAKE these medications   acetaminophen 500 MG tablet Commonly known as:  TYLENOL Take 500 mg by mouth 2 (two) times daily.   ADVAIR HFA 115-21 MCG/ACT inhaler Generic drug:  fluticasone-salmeterol Inhale 2 puffs into the lungs 2 (two) times daily.   albuterol 108 (90 Base) MCG/ACT inhaler Commonly known as:  PROVENTIL HFA;VENTOLIN HFA Inhale 2 puffs into the lungs every 6 (six) hours as needed for wheezing or shortness of breath.   BIOFREEZE 4 % Gel Generic drug:  Menthol (Topical Analgesic) Apply topically 3 (three) times daily. Patient applies to left side and back   BIOFREEZE 4 % Gel Generic drug:  Menthol (Topical Analgesic) Apply 1 application topically 3 (three) times daily. To right knee x 2 weeks. Start date 01/22/16 end date 02/05/16   CALCIUM 600+D 600-800 MG-UNIT Tabs Generic drug:  Calcium Carb-Cholecalciferol Take 1 tablet by mouth daily.   diltiazem 120 MG 24 hr capsule Commonly known as:  DILACOR XR Take 120 mg by mouth at bedtime. Hold if pulse is less than 55 or if systolic less than 100.   docusate sodium 100 MG capsule Commonly known as:  COLACE Take 1 capsule (100  mg total) by mouth 2 (two) times daily. What changed:  when to take this   DULoxetine 20 MG capsule Commonly known as:  CYMBALTA Take 20 mg by mouth daily.   feeding supplement (ENSURE ENLIVE) Liqd Take 237 mLs by mouth 2 (two) times daily between meals.   furosemide 20 MG tablet Commonly known as:  LASIX Take 0.5 tablets (10 mg total) by mouth daily as needed for fluid or edema. What changed:  when to take this  reasons to take this   guaiFENesin 600 MG 12 hr tablet Commonly known as:  MUCINEX Take 1 tablet (600 mg total) by mouth 2 (two) times daily.   ipratropium 0.02 % nebulizer solution Commonly known as:  ATROVENT Take 0.5 mg by nebulization every 6 (six) hours as needed for wheezing or shortness of breath.   levothyroxine 100 MCG tablet Commonly known as:  SYNTHROID,  LEVOTHROID Take 1 tablet (100 mcg total) by mouth daily before breakfast. Start taking on:  04/13/2016 What changed:  medication strength  how much to take   loratadine 10 MG tablet Commonly known as:  CLARITIN Take 10 mg by mouth daily.   losartan 25 MG tablet Commonly known as:  COZAAR Take 25 mg by mouth daily.   Melatonin 3 MG Tabs Take 3 mg by mouth at bedtime.   OPTIVE SENSITIVE 0.5-0.9 % Soln Generic drug:  Carboxymethylcell-Glycerin PF Place 1 drop into both eyes every 12 (twelve) hours.   pantoprazole 20 MG tablet Commonly known as:  PROTONIX Take 20 mg by mouth daily.   psyllium 0.52 g capsule Commonly known as:  REGULOID Take 0.52 g by mouth at bedtime.   traMADol 50 MG tablet Commonly known as:  ULTRAM Take 0.5 tablets (25 mg total) by mouth every 8 (eight) hours as needed (pain). Give 1/2 to 1 tablet by mouth every 8 hours as needed for pain that is uncontrolled by tylenol.   traZODone 50 MG tablet Commonly known as:  DESYREL Take 25 mg by mouth daily. As needed For depression      Allergies  Allergen Reactions  . Penicillins Other (See Comments)    Reaction:  Unknown  Has patient had a PCN reaction causing immediate rash, facial/tongue/throat swelling, SOB or lightheadedness with hypotension: Unsure Has patient had a PCN reaction causing severe rash involving mucus membranes or skin necrosis: Unsure Has patient had a PCN reaction that required hospitalization Unsure Has patient had a PCN reaction occurring within the last 10 years: Unsure If all of the above answers are "NO", then may proceed with Cephalosporin use.  . Pneumovax [Pneumococcal Polysaccharide Vaccine] Other (See Comments)    Reaction:  Unknown   . Statins Other (See Comments)    Reaction:  Unknown   . Sulfa Antibiotics Other (See Comments)    Reaction:  Unknown    Follow-up Information    Nanetta Batty, MD Follow up in 1 month(s).   Specialties:  Cardiology, Radiology Why:   afib/ Qtc monitoring h/o pacemaker Contact information: [address] [place] [address] [phone]        [name], MD Follow up in 1 week(s).   Specialty:  Family Medicine Why:  hospital discharge follow up, repeat cbc/bmp at follow up, monitor sodium level Contact information: [address] [place] [address] [phone]            The results of significant diagnostics from this hospitalization (including imaging, microbiology, ancillary and laboratory) are listed  below for reference.    Significant Diagnostic Studies: Dg Chest 2 View  Result Date: 04/07/2016 CLINICAL DATA:  Medical clearance for psychiatric evaluation. Suicidal ideation. Complains of urinary frequency and burning with urination. EXAM: CHEST  2 VIEW COMPARISON:  01/25/2016 FINDINGS: Cardiac pacemaker. Normal heart size and pulmonary vascularity. No focal airspace disease or consolidation in the lungs. No blunting of costophrenic angles. No pneumothorax. Mediastinal contours appear intact. Degenerative changes in the spine. Calcified and tortuous aorta. IMPRESSION: No active cardiopulmonary disease. Electronically Signed   By: Burman Nieves M.D.   On: 04/07/2016 22:14   Portable Chest 1 View  Result Date: 04/09/2016 CLINICAL DATA:  COPD EXAM: PORTABLE CHEST 1 VIEW COMPARISON:  04/07/2016 FINDINGS: Cardiac shadow is mildly enlarged. Pacing device is again seen and stable. The lungs are well aerated bilaterally. Left rib fractures with healing are noted. No pneumothorax is seen. No acute bony abnormality is noted. IMPRESSION: Healing left rib fractures.  No acute abnormality noted. Electronically Signed   By: Alcide Clever M.D.   On: 04/09/2016 07:47    Microbiology: Recent Results (from the past 240 hour(s))  Urine culture     Status: Abnormal   Collection Time: 04/07/16  2:12 PM  Result Value Ref Range Status   Specimen Description URINE, CLEAN CATCH  Final    Special Requests NONE  Final   Culture MULTIPLE SPECIES PRESENT, SUGGEST RECOLLECTION (A)  Final   Report Status 04/08/2016 FINAL  Final  Respiratory Panel by PCR     Status: Abnormal   Collection Time: 04/08/16  7:56 PM  Result Value Ref Range Status   Adenovirus NOT DETECTED NOT DETECTED Final   Coronavirus 229E NOT DETECTED NOT DETECTED Final   Coronavirus HKU1 NOT DETECTED NOT DETECTED Final   Coronavirus NL63 NOT DETECTED NOT DETECTED Final   Coronavirus OC43 NOT DETECTED NOT DETECTED Final   Metapneumovirus NOT DETECTED NOT DETECTED Final   Rhinovirus / Enterovirus DETECTED (A) NOT DETECTED Final   Influenza A NOT DETECTED NOT DETECTED Final   Influenza B NOT DETECTED NOT DETECTED Final   Parainfluenza Virus 1 NOT DETECTED NOT DETECTED Final   Parainfluenza Virus 2 NOT DETECTED NOT DETECTED Final   Parainfluenza Virus 3 NOT DETECTED NOT DETECTED Final   Parainfluenza Virus 4 NOT DETECTED NOT DETECTED Final   Respiratory Syncytial Virus NOT DETECTED NOT DETECTED Final   Bordetella pertussis NOT DETECTED NOT DETECTED Final   Chlamydophila pneumoniae NOT DETECTED NOT DETECTED Final   Mycoplasma pneumoniae NOT DETECTED NOT DETECTED Final    Comment: Performed at [facility]     Labs: Basic Metabolic Panel:  Recent Labs Lab 04/07/16 1450 04/08/16 1842 04/09/16 0519 04/10/16 0501 04/11/16 0521 04/12/16 0545  NA 129*  --  128* 127* 131* 130*  K 4.0  --  4.5 4.9 4.1 4.5  CL 97*  --  93* 96* 101 99*  CO2 25  --  26 25 26 25   GLUCOSE 109*  --  184* 158* 107* 122*  BUN 11  --  21* 27* 25* 26*  CREATININE 0.59 0.80 0.80 0.70 0.72 0.62  CALCIUM 9.0  --  9.5 9.0 8.6* 8.6*  MG  --   --   --  1.9  --   --    Liver Function Tests:  Recent Labs Lab 04/07/16 1450  AST 15  ALT 11*  ALKPHOS 71  BILITOT 0.5  PROT 6.2*  ALBUMIN 3.8   No results for input(s): LIPASE, AMYLASE  in the last 168 hours. No results for input(s): AMMONIA in the last 168  hours. CBC:  Recent Labs Lab 04/07/16 1450 04/08/16 1842 04/09/16 0519  WBC 6.4 7.7 4.6  NEUTROABS 3.5  --   --   HGB 13.0 13.1 12.8  HCT 37.7 37.4 37.0  MCV 82.5 81.5 82.6  PLT 263 259 263   Cardiac Enzymes: No results for input(s): CKTOTAL, CKMB, CKMBINDEX, TROPONINI in the last 168 hours. BNP: BNP (last 3 results)  Recent Labs  12/23/15 1710 01/25/16 1259  BNP 855.7* 493.6*    ProBNP (last 3 results) No results for input(s): PROBNP in the last 8760 hours.  CBG: No results for input(s): GLUCAP in the last 168 hours.     SignedAlbertine Grates MD, PhD  [facility] 04/12/2016, 9:51 AM

## 2016-04-12 NOTE — Clinical Social Work Note (Signed)
Medical Social Worker facilitated patient discharge including contacting patient family (dtr-in-law, [name]) and facility to confirm patient discharge plans.  Clinical information faxed to facility and family agreeable with plan.  MSW arranged ambulance transport via [facility] to [facility].  RN to call report prior to discharge.  Medical Social Worker will sign off for now as social work intervention is no longer needed. Please consult [place] again if new need arises.  Derenda Fennel, MSW [phone] 04/12/2016 11:09 AM

## 2016-04-12 NOTE — Progress Notes (Signed)
Patient discharged to [facility].  EMS transporting.  Patient denies pain, room air, no s/s of distress.  Patient leaving with personal belongings and prescriptions.  No complaints.

## 2016-04-12 NOTE — Progress Notes (Signed)
Called report to [facility], [phone] and spoke with Arther Dames, RN.

## 2016-05-03 ENCOUNTER — Encounter (HOSPITAL_COMMUNITY): Payer: Self-pay | Admitting: Emergency Medicine

## 2016-05-03 ENCOUNTER — Emergency Department (HOSPITAL_COMMUNITY)
Admission: EM | Admit: 2016-05-03 | Discharge: 2016-05-04 | Disposition: A | Discharge status: Home / Self Care | Payer: Medicare Other | Attending: Emergency Medicine | Admitting: Emergency Medicine

## 2016-05-03 DIAGNOSIS — Z950 Presence of cardiac pacemaker: Secondary | ICD-10-CM | POA: Diagnosis not present

## 2016-05-03 DIAGNOSIS — I110 Hypertensive heart disease with heart failure: Secondary | ICD-10-CM | POA: Diagnosis not present

## 2016-05-03 DIAGNOSIS — J449 Chronic obstructive pulmonary disease, unspecified: Secondary | ICD-10-CM | POA: Diagnosis not present

## 2016-05-03 DIAGNOSIS — I509 Heart failure, unspecified: Secondary | ICD-10-CM | POA: Diagnosis not present

## 2016-05-03 DIAGNOSIS — E119 Type 2 diabetes mellitus without complications: Secondary | ICD-10-CM | POA: Diagnosis not present

## 2016-05-03 DIAGNOSIS — E039 Hypothyroidism, unspecified: Secondary | ICD-10-CM | POA: Insufficient documentation

## 2016-05-03 DIAGNOSIS — R112 Nausea with vomiting, unspecified: Secondary | ICD-10-CM | POA: Insufficient documentation

## 2016-05-03 LAB — COMPREHENSIVE METABOLIC PANEL
ALT: 17 U/L (ref 14–54)
ANION GAP: 8 (ref 5–15)
AST: 18 U/L (ref 15–41)
Albumin: 4 g/dL (ref 3.5–5.0)
Alkaline Phosphatase: 66 U/L (ref 38–126)
BUN: 21 mg/dL — ABNORMAL HIGH (ref 6–20)
CALCIUM: 9.3 mg/dL (ref 8.9–10.3)
CHLORIDE: 96 mmol/L — AB (ref 101–111)
CO2: 24 mmol/L (ref 22–32)
Creatinine, Ser: 0.8 mg/dL (ref 0.44–1.00)
Glucose, Bld: 142 mg/dL — ABNORMAL HIGH (ref 65–99)
Potassium: 4.2 mmol/L (ref 3.5–5.1)
SODIUM: 128 mmol/L — AB (ref 135–145)
Total Bilirubin: 0.6 mg/dL (ref 0.3–1.2)
Total Protein: 6.7 g/dL (ref 6.5–8.1)

## 2016-05-03 LAB — CBC WITH DIFFERENTIAL/PLATELET
Basophils Absolute: 0 10*3/uL (ref 0.0–0.1)
Basophils Relative: 0 %
EOS ABS: 0.1 10*3/uL (ref 0.0–0.7)
EOS PCT: 1 %
HCT: 38.6 % (ref 36.0–46.0)
Hemoglobin: 13.7 g/dL (ref 12.0–15.0)
LYMPHS ABS: 0.6 10*3/uL — AB (ref 0.7–4.0)
Lymphocytes Relative: 6 %
MCH: 29.4 pg (ref 26.0–34.0)
MCHC: 35.5 g/dL (ref 30.0–36.0)
MCV: 82.8 fL (ref 78.0–100.0)
MONO ABS: 1.1 10*3/uL — AB (ref 0.1–1.0)
MONOS PCT: 12 %
Neutro Abs: 7.7 10*3/uL (ref 1.7–7.7)
Neutrophils Relative %: 81 %
PLATELETS: 238 10*3/uL (ref 150–400)
RBC: 4.66 MIL/uL (ref 3.87–5.11)
RDW: 13.6 % (ref 11.5–15.5)
WBC: 9.4 10*3/uL (ref 4.0–10.5)

## 2016-05-03 LAB — LIPASE, BLOOD: LIPASE: 28 U/L (ref 11–51)

## 2016-05-03 MED ORDER — SODIUM CHLORIDE 0.9 % IV BOLUS (SEPSIS)
1000.0000 mL | Freq: Once | INTRAVENOUS | Status: AC
  Administered 2016-05-03: 1000 mL via INTRAVENOUS

## 2016-05-03 NOTE — ED Provider Notes (Addendum)
[facility]-EMERGENCY DEPT Provider Note   CSN: [id number] Arrival date & time: 05/03/16  2146     History   Chief Complaint Chief Complaint  Patient presents with  . Emesis    HPI Kathy Peterson is a 82 y.o. female.  Level V caveat for mild dementia. Nursing home reports 3 episodes of nausea and vomiting today. No fever, sweats, chills, chest pain, dyspnea, dysuria. She feels slightly "swimmy headed".      Past Medical History:  Diagnosis Date  . Arthritis    rheumatoid  . CHF (congestive heart failure) (HCC)   . COPD (chronic obstructive pulmonary disease) (HCC)   . Depression   . Diabetes mellitus without complication (HCC)    type 2  . Failure to thrive in adult   . Frequent falls   . GERD (gastroesophageal reflux disease)   . Hypertension   . Hyponatremia   . Orthostatic hypotension   . Pacemaker   . PAF (paroxysmal atrial fibrillation) (HCC)    a. ? mentioned in notes, no further details available.  . Pneumonia   . Polio   . Secondhand smoke exposure   . Shortness of breath   . Thyroid disease    hypothyroid    Patient Active Problem List   Diagnosis Date Noted  . MDD (major depressive disorder), recurrent severe, without psychosis (HCC) 04/08/2016  . COPD exacerbation (HCC) 04/08/2016  . History of pacemaker 04/08/2016  . QT prolongation 04/08/2016  . Episode of recurrent major depressive disorder (HCC)   . FTT (failure to thrive) in adult   . Hyponatremia 12/24/2015  . Rib fractures 12/23/2015  . Atrial fibrillation (HCC) 12/23/2015  . CHF (congestive heart failure) (HCC) 12/23/2015  . COPD (chronic obstructive pulmonary disease) (HCC) 12/23/2015  . Pre-diabetes 12/23/2015  . Essential (primary) hypertension 12/23/2015  . Hypothyroidism 12/23/2015  . Recurrent falls 12/23/2015    Past Surgical History:  Procedure Laterality Date  . ABDOMINAL HYSTERECTOMY    . CHOLECYSTECTOMY    . EYE SURGERY      OB History    Gravida Para Term Preterm AB  Living   2 2 2     2    SAB TAB Ectopic Multiple Live Births           2       Home Medications    Prior to Admission medications   Medication Sig Start Date End Date Taking? Authorizing Provider  acetaminophen (TYLENOL) 500 MG tablet Take 500 mg by mouth 2 (two) times daily.    Yes Historical Provider, MD  albuterol (PROVENTIL HFA;VENTOLIN HFA) 108 (90 Base) MCG/ACT inhaler Inhale 2 puffs into the lungs every 6 (six) hours as needed for wheezing or shortness of breath.   Yes Historical Provider, MD  ARIPiprazole (ABILIFY) 2 MG tablet Take 2 mg by mouth daily.   Yes Historical Provider, MD  Calcium Carb-Cholecalciferol (CALCIUM 600+D) 600-800 MG-UNIT TABS Take 1 tablet by mouth daily.   Yes Historical Provider, MD  Carboxymethylcell-Glycerin PF (OPTIVE SENSITIVE) 0.5-0.9 % SOLN Place 1 drop into both eyes every 12 (twelve) hours.   Yes Historical Provider, MD  carboxymethylcellulose (REFRESH PLUS) 0.5 % SOLN Place 1 drop into both eyes 2 (two) times daily as needed.   Yes Historical Provider, MD  diltiazem (DILACOR XR) 120 MG 24 hr capsule Take 120 mg by mouth at bedtime. Hold if pulse is less than 55 or if systolic less than 100.   Yes Historical Provider, MD  docusate sodium (COLACE) 100  MG capsule Take 1 capsule (100 mg total) by mouth 2 (two) times daily. Patient taking differently: Take 100 mg by mouth every Monday, Wednesday, and Friday.  12/27/15  Yes Nishant Dhungel, MD  DULoxetine (CYMBALTA) 20 MG capsule Take 20 mg by mouth daily.   Yes Historical Provider, MD  fluticasone-salmeterol (ADVAIR HFA) 115-21 MCG/ACT inhaler Inhale 2 puffs into the lungs 2 (two) times daily.   Yes Historical Provider, MD  guaiFENesin (MUCINEX) 600 MG 12 hr tablet Take 1 tablet (600 mg total) by mouth 2 (two) times daily. 04/12/16  Yes Albertine Grates, MD  ipratropium (ATROVENT) 0.02 % nebulizer solution Take 0.5 mg by nebulization every 6 (six) hours as needed for wheezing or shortness of breath.   Yes Historical  Provider, MD  levothyroxine (SYNTHROID, LEVOTHROID) 100 MCG tablet Take 1 tablet (100 mcg total) by mouth daily before breakfast. Patient taking differently: Take 88 mcg by mouth daily before breakfast.  04/13/16  Yes Albertine Grates, MD  loratadine (CLARITIN) 10 MG tablet Take 10 mg by mouth daily.   Yes Historical Provider, MD  losartan (COZAAR) 25 MG tablet Take 25 mg by mouth daily.   Yes Historical Provider, MD  Melatonin 3 MG TABS Take 3 mg by mouth at bedtime.   Yes Historical Provider, MD  Menthol, Topical Analgesic, (BIOFREEZE) 4 % GEL Apply topically 3 (three) times daily. Patient applies to left side and back   Yes Historical Provider, MD  pantoprazole (PROTONIX) 20 MG tablet Take 20 mg by mouth daily.   Yes Historical Provider, MD  psyllium (REGULOID) 0.52 g capsule Take 0.52 g by mouth at bedtime.   Yes Historical Provider, MD  traMADol (ULTRAM) 50 MG tablet Take 0.5 tablets (25 mg total) by mouth every 8 (eight) hours as needed (pain). Give 1/2 to 1 tablet by mouth every 8 hours as needed for pain that is uncontrolled by tylenol. 04/12/16  Yes Albertine Grates, MD  traZODone (DESYREL) 50 MG tablet Take 25 mg by mouth daily. As needed For depression   Yes Historical Provider, MD  feeding supplement, ENSURE ENLIVE, (ENSURE ENLIVE) LIQD Take 237 mLs by mouth 2 (two) times daily between meals. 04/12/16   Albertine Grates, MD  furosemide (LASIX) 20 MG tablet Take 0.5 tablets (10 mg total) by mouth daily as needed for fluid or edema. Patient not taking: Reported on 05/03/2016 04/12/16   Albertine Grates, MD    Family History Family History  Problem Relation Age of Onset  . Heart disease Mother   . Stroke Neg Hx   . Diabetes Neg Hx   . Cancer Neg Hx     Social History Social History  Substance Use Topics  . Smoking status: Never Smoker  . Smokeless tobacco: Never Used  . Alcohol use No     Allergies   Penicillins; Pneumovax [pneumococcal polysaccharide vaccine]; Statins; and Sulfa antibiotics   Review of  Systems Review of Systems  Reason unable to perform ROS: Mild dementia.     Physical Exam Updated Vital Signs BP 164/95 (BP Location: Left Arm)   Pulse 111   Temp 98.1 F (36.7 C) (Oral)   Resp 18   Ht 5\' 5"  (1.651 m)   LMP  (LMP Unknown)   SpO2 100%   Physical Exam  Constitutional: She is oriented to person, place, and time.  Patient is alert, minimally dehydrated  HENT:  Head: Normocephalic and atraumatic.  Eyes: Conjunctivae are normal.  Neck: Neck supple.  Cardiovascular: Normal rate and regular  rhythm.   Pulmonary/Chest: Effort normal and breath sounds normal.  Abdominal: Soft. Bowel sounds are normal.  Soft  Musculoskeletal: Normal range of motion.  Neurological: She is alert and oriented to person, place, and time.  Skin: Skin is warm and dry.  Psychiatric: She has a normal mood and affect. Her behavior is normal.  Nursing note and vitals reviewed.    ED Treatments / Results  Labs (all labs ordered are listed, but only abnormal results are displayed) Labs Reviewed  CBC WITH DIFFERENTIAL/PLATELET - Abnormal; Notable for the following:       Result Value   Lymphs Abs 0.6 (*)    Monocytes Absolute 1.1 (*)    All other components within normal limits  COMPREHENSIVE METABOLIC PANEL - Abnormal; Notable for the following:    Sodium 128 (*)    Chloride 96 (*)    Glucose, Bld 142 (*)    BUN 21 (*)    All other components within normal limits  LIPASE, BLOOD    EKG  EKG Interpretation None       Radiology No results found.  Procedures Procedures (including critical care time)  Medications Ordered in ED Medications  sodium chloride 0.9 % bolus 1,000 mL (1,000 mLs Intravenous New Bag/Given 05/03/16 2224)     Initial Impression / Assessment and Plan / ED Course  I have reviewed the triage vital signs and the nursing notes.  Pertinent labs & imaging results that were available during my care of the patient were reviewed by me and considered in my  medical decision making (see chart for details).  Clinical Course     Patient is hemodynamically stable. No acute abdomen. Labs are reasonable. Will hydrate with IV fluids.  Final Clinical Impressions(s) / ED Diagnoses   Final diagnoses:  Nausea and vomiting, intractability of vomiting not specified, unspecified vomiting type    New Prescriptions New Prescriptions   No medications on file     Donnetta Hutching, MD 05/03/16 2315    Donnetta Hutching, MD 05/03/16 2352

## 2016-05-03 NOTE — ED Notes (Signed)
Bed: SW54 Expected date:  Expected time:  Means of arrival:  Comments: 82yo F n/v

## 2016-05-03 NOTE — Discharge Instructions (Signed)
Labs were reassuring.  Increase fluids. Return if worse.

## 2016-05-03 NOTE — ED Triage Notes (Signed)
Pt bib EMS from [facility] d/t N/V x 3 hours. Staff reported 3 episodes of emesis.  Pt reported to EMS that her stool is not, "Normal."  Pt is A&O x 4.

## 2016-05-04 NOTE — ED Notes (Signed)
Bed: WHALF Expected date:  Expected time:  Means of arrival:  Comments: 

## 2016-06-18 ENCOUNTER — Emergency Department (HOSPITAL_COMMUNITY): Payer: Medicare HMO

## 2016-06-18 ENCOUNTER — Encounter (HOSPITAL_COMMUNITY): Payer: Self-pay | Admitting: *Deleted

## 2016-06-18 ENCOUNTER — Emergency Department (HOSPITAL_COMMUNITY)
Admission: EM | Admit: 2016-06-18 | Discharge: 2016-06-18 | Disposition: A | Discharge status: Home / Self Care | Payer: Medicare HMO | Attending: Emergency Medicine | Admitting: Emergency Medicine

## 2016-06-18 DIAGNOSIS — R51 Headache: Secondary | ICD-10-CM | POA: Diagnosis not present

## 2016-06-18 DIAGNOSIS — S0083XA Contusion of other part of head, initial encounter: Secondary | ICD-10-CM | POA: Diagnosis not present

## 2016-06-18 DIAGNOSIS — W06XXXA Fall from bed, initial encounter: Secondary | ICD-10-CM | POA: Diagnosis not present

## 2016-06-18 DIAGNOSIS — Y929 Unspecified place or not applicable: Secondary | ICD-10-CM | POA: Insufficient documentation

## 2016-06-18 DIAGNOSIS — Y999 Unspecified external cause status: Secondary | ICD-10-CM | POA: Insufficient documentation

## 2016-06-18 DIAGNOSIS — Z79899 Other long term (current) drug therapy: Secondary | ICD-10-CM | POA: Insufficient documentation

## 2016-06-18 DIAGNOSIS — M25551 Pain in right hip: Secondary | ICD-10-CM | POA: Diagnosis not present

## 2016-06-18 DIAGNOSIS — Y939 Activity, unspecified: Secondary | ICD-10-CM | POA: Insufficient documentation

## 2016-06-18 DIAGNOSIS — S0993XA Unspecified injury of face, initial encounter: Secondary | ICD-10-CM | POA: Diagnosis present

## 2016-06-18 DIAGNOSIS — I110 Hypertensive heart disease with heart failure: Secondary | ICD-10-CM | POA: Diagnosis not present

## 2016-06-18 DIAGNOSIS — E119 Type 2 diabetes mellitus without complications: Secondary | ICD-10-CM | POA: Insufficient documentation

## 2016-06-18 DIAGNOSIS — J449 Chronic obstructive pulmonary disease, unspecified: Secondary | ICD-10-CM | POA: Diagnosis not present

## 2016-06-18 DIAGNOSIS — E039 Hypothyroidism, unspecified: Secondary | ICD-10-CM | POA: Insufficient documentation

## 2016-06-18 DIAGNOSIS — I509 Heart failure, unspecified: Secondary | ICD-10-CM | POA: Insufficient documentation

## 2016-06-18 DIAGNOSIS — Z950 Presence of cardiac pacemaker: Secondary | ICD-10-CM | POA: Diagnosis not present

## 2016-06-18 DIAGNOSIS — W19XXXA Unspecified fall, initial encounter: Secondary | ICD-10-CM

## 2016-06-18 MED ORDER — ACETAMINOPHEN 325 MG PO TABS
650.0000 mg | ORAL_TABLET | Freq: Once | ORAL | Status: AC
  Administered 2016-06-18: 650 mg via ORAL
  Filled 2016-06-18: qty 2

## 2016-06-18 NOTE — ED Notes (Signed)
Bed: WA07 Expected date:  Expected time:  Means of arrival:  Comments: 

## 2016-06-18 NOTE — Discharge Instructions (Signed)
Take Tylenol for pain.  Apply ice 10-20 minutes three times daily for pain and swelling of your face.  Follow up with your primary care physician within the next week.  Return to the ED for any new or concerning symptoms.

## 2016-06-18 NOTE — ED Notes (Signed)
Attempted to call facility x2 for discharge report without success.  Went over instructions with patient.

## 2016-06-18 NOTE — ED Notes (Signed)
[facility] called again due to patient still waiting almost 2 hours.

## 2016-06-18 NOTE — ED Provider Notes (Signed)
[facility]-EMERGENCY DEPT Provider Note   CSN: [id number] Arrival date & time: 06/18/16  1101     History   Chief Complaint Chief Complaint  Patient presents with  . Fall    HPI Kathy Peterson is a 83 y.o. female.  HPI Kathy Peterson is a 83 y.o. female with PMH significant for COPD, DM, HTN, and CHF BIB EMS from [facility] assisted living facility for mechanical fall this morning.  Patient was in bed when she was reaching for her glasses on the nightstand when she accidentally rolled off the bed landing on her right hip, right wrist, and face.  She now complains of headache, swelling above the left eye, right wrist pain, and right hip pain.  No LOC.  She is not on anticoagulants/antiplatelets. She has not ambulated since the event. She normally ambulates with a walker.   Past Medical History:  Diagnosis Date  . Arthritis    rheumatoid  . CHF (congestive heart failure) (HCC)   . COPD (chronic obstructive pulmonary disease) (HCC)   . Depression   . Diabetes mellitus without complication (HCC)    type 2  . Failure to thrive in adult   . Frequent falls   . GERD (gastroesophageal reflux disease)   . Hypertension   . Hyponatremia   . Orthostatic hypotension   . Pacemaker   . PAF (paroxysmal atrial fibrillation) (HCC)    a. ? mentioned in notes, no further details available.  . Pneumonia   . Polio   . Secondhand smoke exposure   . Shortness of breath   . Thyroid disease    hypothyroid    Patient Active Problem List   Diagnosis Date Noted  . MDD (major depressive disorder), recurrent severe, without psychosis (HCC) 04/08/2016  . COPD exacerbation (HCC) 04/08/2016  . History of pacemaker 04/08/2016  . QT prolongation 04/08/2016  . Episode of recurrent major depressive disorder (HCC)   . FTT (failure to thrive) in adult   . Hyponatremia 12/24/2015  . Rib fractures 12/23/2015  . Atrial fibrillation (HCC) 12/23/2015  . CHF (congestive heart failure) (HCC) 12/23/2015  . COPD  (chronic obstructive pulmonary disease) (HCC) 12/23/2015  . Pre-diabetes 12/23/2015  . Essential (primary) hypertension 12/23/2015  . Hypothyroidism 12/23/2015  . Recurrent falls 12/23/2015    Past Surgical History:  Procedure Laterality Date  . ABDOMINAL HYSTERECTOMY    . CHOLECYSTECTOMY    . EYE SURGERY      OB History    Gravida Para Term Preterm AB Living   2 2 2     2    SAB TAB Ectopic Multiple Live Births           2       Home Medications    Prior to Admission medications   Medication Sig Start Date End Date Taking? Authorizing Provider  acetaminophen (TYLENOL) 500 MG tablet Take 500 mg by mouth 2 (two) times daily.    Yes Historical Provider, MD  albuterol (PROVENTIL HFA;VENTOLIN HFA) 108 (90 Base) MCG/ACT inhaler Inhale 2 puffs into the lungs every 6 (six) hours as needed for wheezing or shortness of breath.   Yes Historical Provider, MD  ARIPiprazole (ABILIFY) 2 MG tablet Take 2 mg by mouth daily with breakfast.    Yes Historical Provider, MD  Calcium Carb-Cholecalciferol (CALCIUM 600+D) 600-800 MG-UNIT TABS Take 1 tablet by mouth daily with breakfast.    Yes Historical Provider, MD  Carboxymethylcell-Glycerin PF (OPTIVE SENSITIVE) 0.5-0.9 % SOLN Place 1 drop into both eyes  every 12 (twelve) hours.   Yes Historical Provider, MD  clonazePAM (KLONOPIN) 0.25 MG disintegrating tablet Take 0.25 mg by mouth daily with breakfast.   Yes Historical Provider, MD  diltiazem (DILACOR XR) 120 MG 24 hr capsule Take 120 mg by mouth at bedtime. Hold if pulse is less than 55 or if systolic less than 100.   Yes Historical Provider, MD  docusate sodium (COLACE) 100 MG capsule Take 1 capsule (100 mg total) by mouth 2 (two) times daily. Patient taking differently: Take 100 mg by mouth every Monday, Wednesday, and Friday.  12/27/15  Yes Nishant Dhungel, MD  DULoxetine (CYMBALTA) 20 MG capsule Take 40 mg by mouth daily with breakfast.    Yes Historical Provider, MD  feeding supplement, ENSURE  ENLIVE, (ENSURE ENLIVE) LIQD Take 237 mLs by mouth 2 (two) times daily between meals. 04/12/16  Yes Albertine Grates, MD  fluticasone-salmeterol (ADVAIR HFA) [phone] MCG/ACT inhaler Inhale 2 puffs into the lungs 2 (two) times daily.   Yes Historical Provider, MD  ipratropium (ATROVENT) 0.02 % nebulizer solution Take 0.5 mg by nebulization every 6 (six) hours as needed for wheezing or shortness of breath.   Yes Historical Provider, MD  levothyroxine (SYNTHROID, LEVOTHROID) 88 MCG tablet Take 88 mcg by mouth at bedtime.   Yes Historical Provider, MD  loratadine (CLARITIN) 10 MG tablet Take 10 mg by mouth daily with breakfast.    Yes Historical Provider, MD  losartan (COZAAR) 25 MG tablet Take 25 mg by mouth daily with breakfast.    Yes Historical Provider, MD  Melatonin 3 MG TABS Take 3 mg by mouth at bedtime.   Yes Historical Provider, MD  Menthol, Topical Analgesic, 5 % GEL Apply 1 application topically 3 (three) times daily. Apply to left side and back   Yes Historical Provider, MD  Menthol, Topical Analgesic, 5 % GEL Apply 1 application topically daily as needed (for chest pain). Apply to chest if needed for pain   Yes Historical Provider, MD  oseltamivir (TAMIFLU) 75 MG capsule Take 75 mg by mouth every other day. Started 01/27 for 8 days   Yes Historical Provider, MD  pantoprazole (PROTONIX) 20 MG tablet Take 20 mg by mouth daily with breakfast.    Yes Historical Provider, MD  psyllium (REGULOID) 0.52 g capsule Take 0.52 g by mouth at bedtime.   Yes Historical Provider, MD  traMADol (ULTRAM) 50 MG tablet Take 0.5 tablets (25 mg total) by mouth every 8 (eight) hours as needed (pain). Give 1/2 to 1 tablet by mouth every 8 hours as needed for pain that is uncontrolled by tylenol. Patient taking differently: Take 25 mg by mouth every 8 (eight) hours as needed (for pain).  04/12/16  Yes Albertine Grates, MD  traZODone (DESYREL) 50 MG tablet Take 25 mg by mouth daily as needed for sleep.    Yes Historical Provider, MD    furosemide (LASIX) 20 MG tablet Take 0.5 tablets (10 mg total) by mouth daily as needed for fluid or edema. Patient not taking: Reported on 05/03/2016 04/12/16   Albertine Grates, MD  guaiFENesin (MUCINEX) 600 MG 12 hr tablet Take 1 tablet (600 mg total) by mouth 2 (two) times daily. Patient not taking: Reported on 06/18/2016 04/12/16   Albertine Grates, MD  levothyroxine (SYNTHROID, LEVOTHROID) 100 MCG tablet Take 1 tablet (100 mcg total) by mouth daily before breakfast. Patient not taking: Reported on 06/18/2016 04/13/16   Albertine Grates, MD  Menthol, Topical Analgesic, (BIOFREEZE) 4 % GEL Apply topically  3 (three) times daily. Patient applies to left side and back    Historical Provider, MD    Family History Family History  Problem Relation Age of Onset  . Heart disease Mother   . Stroke Neg Hx   . Diabetes Neg Hx   . Cancer Neg Hx     Social History Social History  Substance Use Topics  . Smoking status: Never Smoker  . Smokeless tobacco: Never Used  . Alcohol use No     Allergies   Bactrim [sulfamethoxazole-trimethoprim]; Penicillins; Pneumovax [pneumococcal polysaccharide vaccine]; Statins; and Sulfa antibiotics   Review of Systems Review of Systems All other systems negative unless otherwise stated in HPI   Physical Exam Updated Vital Signs BP 151/63 (BP Location: Left Arm)   Pulse 100   Temp 97.8 F (36.6 C) (Oral)   Resp 18   LMP  (LMP Unknown)   SpO2 99%   Physical Exam  Constitutional: She is oriented to person, place, and time. She appears well-developed and well-nourished.  Non-toxic appearance. She does not have a sickly appearance. She does not appear ill.  HENT:  Head: Normocephalic.  Mouth/Throat: Oropharynx is clear and moist.  Hematoma and bruising to left forehead.  No bony instability.  No signs of entrapment.   Eyes: Conjunctivae are normal. Pupils are equal, round, and reactive to light.  Neck: Normal range of motion. Neck supple.  No cervical midline tenderness.    Cardiovascular: Normal rate and regular rhythm.   Pulmonary/Chest: Effort normal and breath sounds normal. No accessory muscle usage or stridor. No respiratory distress. She has no wheezes. She has no rhonchi. She has no rales.  Abdominal: Soft. Bowel sounds are normal. She exhibits no distension. There is no tenderness.  Musculoskeletal: Normal range of motion. She exhibits tenderness.  Pelvis stable. Right wrist non-tender. Normal ROM. Right hip TTP. Slightly decreased ROM due to pain.   Lymphadenopathy:    She has no cervical adenopathy.  Neurological: She is alert and oriented to person, place, and time.  Strength and sensation intact in all extremities.  Decreased strength in LLE due to pain in right hip.  Skin: Skin is warm and dry.  Psychiatric: She has a normal mood and affect. Her behavior is normal.     ED Treatments / Results  Labs (all labs ordered are listed, but only abnormal results are displayed) Labs Reviewed - No data to display  EKG  EKG Interpretation None       Radiology Dg Chest 1 View  Result Date: 06/18/2016 CLINICAL DATA:  Status post fall now complaining of right hip pain. History of CHF, COPD, and hypertension. EXAM: CHEST 1 VIEW COMPARISON:  Portable chest x-ray of April 09, 2016 FINDINGS: The right lung is well-expanded and clear. There is hazy increased density at the left lung base but this is not clearly new. The heart is normal in size. The pulmonary vascularity is normal. The ICD is in stable position. There is calcification in the wall of the thoracic aorta. Old rib fractures on the left have healed with deformity. IMPRESSION: There is no acute cardiopulmonary abnormality. Thoracic aortic atherosclerosis. Electronically Signed   By: David  [place] M.D.   On: 06/18/2016 12:37   Dg Wrist Complete Right  Result Date: 06/18/2016 CLINICAL DATA:  Acute right wrist pain and fall. EXAM: RIGHT WRIST - COMPLETE 3+ VIEW COMPARISON:  None. FINDINGS:  Subluxation and degenerative changes at the scaphoid- trapezium joint noted. Scapholunate interval is widened. No acute fracture  or dislocation identified. No suspicious focal bony lesions are identified. IMPRESSION: Scapholunate -trapezium joint subluxation with degenerative changes and widened scapholunate interval-likely chronic. Correlate with pain. No acute fracture or dislocation. Electronically Signed   By: Harmon Pier M.D.   On: 06/18/2016 12:41   Ct Head Wo Contrast  Result Date: 06/18/2016 CLINICAL DATA:  Fall from bed this morning. Hit head. Swelling above left orbit EXAM: CT HEAD WITHOUT CONTRAST CT MAXILLOFACIAL WITHOUT CONTRAST CT CERVICAL SPINE WITHOUT CONTRAST TECHNIQUE: Multidetector CT imaging of the head, cervical spine, and maxillofacial structures were performed using the standard protocol without intravenous contrast. Multiplanar CT image reconstructions of the cervical spine and maxillofacial structures were also generated. COMPARISON:  None. FINDINGS: CT HEAD FINDINGS Brain: There is atrophy and chronic small vessel disease changes. No acute intracranial abnormality. Specifically, no hemorrhage, hydrocephalus, mass lesion, acute infarction, or significant intracranial injury. Vascular: No hyperdense vessel or unexpected calcification. Skull: No acute calvarial abnormality. Other: Visualized paranasal sinuses and mastoids clear. Orbital soft tissues unremarkable. CT MAXILLOFACIAL FINDINGS Osseous: No fracture or mandibular dislocation. No destructive process. Orbits: Negative. No traumatic or inflammatory finding. Sinuses: Coastal thickening in the right ethmoid air cell. Otherwise paranasal sinuses and mastoids are clear. Soft tissues: Soft tissue swelling over the left forehead. No bony abnormality. CT CERVICAL SPINE FINDINGS Alignment: Slight degenerative anterolisthesis of C3 on C4 and C4 on C5 as well as C7 on T1. Skull base and vertebrae: No acute fracture. No primary bone lesion or  focal pathologic process. Soft tissues and spinal canal: No prevertebral fluid or swelling. No visible canal hematoma. Disc levels: Severe degenerative disc and facet disease throughout the cervical spine. Upper chest: Biapical scarring, right greater than left. Other: None IMPRESSION: No acute intracranial abnormality.Atrophy, chronic microvascular disease. No acute bony abnormality in the face or cervical spine. Advanced cervical spondylosis. Electronically Signed   By: Charlett Nose M.D.   On: 06/18/2016 12:23   Ct Cervical Spine Wo Contrast  Result Date: 06/18/2016 CLINICAL DATA:  Fall from bed this morning. Hit head. Swelling above left orbit EXAM: CT HEAD WITHOUT CONTRAST CT MAXILLOFACIAL WITHOUT CONTRAST CT CERVICAL SPINE WITHOUT CONTRAST TECHNIQUE: Multidetector CT imaging of the head, cervical spine, and maxillofacial structures were performed using the standard protocol without intravenous contrast. Multiplanar CT image reconstructions of the cervical spine and maxillofacial structures were also generated. COMPARISON:  None. FINDINGS: CT HEAD FINDINGS Brain: There is atrophy and chronic small vessel disease changes. No acute intracranial abnormality. Specifically, no hemorrhage, hydrocephalus, mass lesion, acute infarction, or significant intracranial injury. Vascular: No hyperdense vessel or unexpected calcification. Skull: No acute calvarial abnormality. Other: Visualized paranasal sinuses and mastoids clear. Orbital soft tissues unremarkable. CT MAXILLOFACIAL FINDINGS Osseous: No fracture or mandibular dislocation. No destructive process. Orbits: Negative. No traumatic or inflammatory finding. Sinuses: Coastal thickening in the right ethmoid air cell. Otherwise paranasal sinuses and mastoids are clear. Soft tissues: Soft tissue swelling over the left forehead. No bony abnormality. CT CERVICAL SPINE FINDINGS Alignment: Slight degenerative anterolisthesis of C3 on C4 and C4 on C5 as well as C7 on T1.  Skull base and vertebrae: No acute fracture. No primary bone lesion or focal pathologic process. Soft tissues and spinal canal: No prevertebral fluid or swelling. No visible canal hematoma. Disc levels: Severe degenerative disc and facet disease throughout the cervical spine. Upper chest: Biapical scarring, right greater than left. Other: None IMPRESSION: No acute intracranial abnormality.Atrophy, chronic microvascular disease. No acute bony abnormality in the face or cervical spine. Advanced  cervical spondylosis. Electronically Signed   By: Charlett Nose M.D.   On: 06/18/2016 12:23   Ct Hip Right Wo Contrast  Result Date: 06/18/2016 CLINICAL DATA:  Right hip pain due to a fall today. Initial encounter. EXAM: CT OF THE RIGHT HIP WITHOUT CONTRAST TECHNIQUE: Multidetector CT imaging of the right hip was performed according to the standard protocol. Multiplanar CT image reconstructions were also generated. COMPARISON:  Plain films right hip 06/18/2016. FINDINGS: Bones/Joint/Cartilage The right hip joint is fused. There is no fracture or focal bony lesion. Ligaments Suboptimally assessed by CT. Muscles and Tendons Atrophic without focal lesion. Soft tissues Sigmoid diverticulosis is noted.  Right inguinal hernia is seen. IMPRESSION: No acute abnormality.  The right hip joint is fused. Atherosclerosis. Sigmoid diverticulosis. Right inguinal hernia. Electronically Signed   By: Drusilla Kanner M.D.   On: 06/18/2016 15:36   Dg Hip Unilat With Pelvis 2-3 Views Right  Result Date: 06/18/2016 CLINICAL DATA:  Acute right hip pain following fall. Initial encounter. EXAM: DG HIP (WITH OR WITHOUT PELVIS) 2-3V RIGHT COMPARISON:  None. FINDINGS: Chronic fusion at the right hip joint noted. No acute fracture, subluxation or dislocation identified. No suspicious focal bony lesions are present. Degenerative changes in the lower lumbar spine are noted. IMPRESSION: No evidence of acute abnormality. Chronic right hip fusion  changes. Electronically Signed   By: Harmon Pier M.D.   On: 06/18/2016 12:42   Ct Maxillofacial Wo Cm  Result Date: 06/18/2016 CLINICAL DATA:  Fall from bed this morning. Hit head. Swelling above left orbit EXAM: CT HEAD WITHOUT CONTRAST CT MAXILLOFACIAL WITHOUT CONTRAST CT CERVICAL SPINE WITHOUT CONTRAST TECHNIQUE: Multidetector CT imaging of the head, cervical spine, and maxillofacial structures were performed using the standard protocol without intravenous contrast. Multiplanar CT image reconstructions of the cervical spine and maxillofacial structures were also generated. COMPARISON:  None. FINDINGS: CT HEAD FINDINGS Brain: There is atrophy and chronic small vessel disease changes. No acute intracranial abnormality. Specifically, no hemorrhage, hydrocephalus, mass lesion, acute infarction, or significant intracranial injury. Vascular: No hyperdense vessel or unexpected calcification. Skull: No acute calvarial abnormality. Other: Visualized paranasal sinuses and mastoids clear. Orbital soft tissues unremarkable. CT MAXILLOFACIAL FINDINGS Osseous: No fracture or mandibular dislocation. No destructive process. Orbits: Negative. No traumatic or inflammatory finding. Sinuses: Coastal thickening in the right ethmoid air cell. Otherwise paranasal sinuses and mastoids are clear. Soft tissues: Soft tissue swelling over the left forehead. No bony abnormality. CT CERVICAL SPINE FINDINGS Alignment: Slight degenerative anterolisthesis of C3 on C4 and C4 on C5 as well as C7 on T1. Skull base and vertebrae: No acute fracture. No primary bone lesion or focal pathologic process. Soft tissues and spinal canal: No prevertebral fluid or swelling. No visible canal hematoma. Disc levels: Severe degenerative disc and facet disease throughout the cervical spine. Upper chest: Biapical scarring, right greater than left. Other: None IMPRESSION: No acute intracranial abnormality.Atrophy, chronic microvascular disease. No acute bony  abnormality in the face or cervical spine. Advanced cervical spondylosis. Electronically Signed   By: Charlett Nose M.D.   On: 06/18/2016 12:23    Procedures Procedures (including critical care time)  Medications Ordered in ED Medications  acetaminophen (TYLENOL) tablet 650 mg (650 mg Oral Given 06/18/16 1230)     Initial Impression / Assessment and Plan / ED Course  I have reviewed the triage vital signs and the nursing notes.  Pertinent labs & imaging results that were available during my care of the patient were reviewed by me and  considered in my medical decision making (see chart for details).     Patient presents with mechanical fall today. Imaging including CT of the head, face, and neck are unremarkable.  Plain films of right wrist and right hip with chronic changes and no acute abnormalities.  Patient was unable to ambulate with walker.  Therefore, CT right hip obtained and this was negative.  Patient able to get up with assistance and ambulate in room.  She will be discharged back to facility.  Stable.  Case has been discussed with and seen by Dr. Adela Lank who agrees with the above plan for discharge.   Final Clinical Impressions(s) / ED Diagnoses   Final diagnoses:  Fall, initial encounter  Contusion of face, initial encounter  Right hip pain    New Prescriptions Discharge Medication List as of 06/18/2016  3:54 PM       Cheri Fowler, PA-C 06/18/16 2120    Melene Plan, DO 06/20/16 2351

## 2016-06-18 NOTE — ED Triage Notes (Signed)
Per EMS pt coming from [facility] with c/o mechanical fall, pt hit head, swelling noted above left eye, not on blood thinners. VS en ruote: 138/90, 68HR, 16 cbg 144

## 2016-06-18 NOTE — ED Notes (Signed)
Called [facility] for transport.  

## 2016-12-06 ENCOUNTER — Encounter (HOSPITAL_COMMUNITY): Payer: Self-pay | Admitting: Emergency Medicine

## 2016-12-06 ENCOUNTER — Emergency Department (HOSPITAL_COMMUNITY)
Admission: EM | Admit: 2016-12-06 | Discharge: 2016-12-06 | Disposition: A | Discharge status: Skilled Nursing Facility | Payer: Medicare HMO | Attending: Emergency Medicine | Admitting: Emergency Medicine

## 2016-12-06 DIAGNOSIS — I110 Hypertensive heart disease with heart failure: Secondary | ICD-10-CM | POA: Insufficient documentation

## 2016-12-06 DIAGNOSIS — Z950 Presence of cardiac pacemaker: Secondary | ICD-10-CM | POA: Diagnosis not present

## 2016-12-06 DIAGNOSIS — J449 Chronic obstructive pulmonary disease, unspecified: Secondary | ICD-10-CM | POA: Insufficient documentation

## 2016-12-06 DIAGNOSIS — N3001 Acute cystitis with hematuria: Secondary | ICD-10-CM | POA: Diagnosis not present

## 2016-12-06 DIAGNOSIS — Z79899 Other long term (current) drug therapy: Secondary | ICD-10-CM | POA: Insufficient documentation

## 2016-12-06 DIAGNOSIS — I509 Heart failure, unspecified: Secondary | ICD-10-CM | POA: Diagnosis not present

## 2016-12-06 DIAGNOSIS — E039 Hypothyroidism, unspecified: Secondary | ICD-10-CM | POA: Insufficient documentation

## 2016-12-06 DIAGNOSIS — E119 Type 2 diabetes mellitus without complications: Secondary | ICD-10-CM | POA: Insufficient documentation

## 2016-12-06 DIAGNOSIS — R1031 Right lower quadrant pain: Secondary | ICD-10-CM | POA: Diagnosis present

## 2016-12-06 LAB — URINALYSIS, ROUTINE W REFLEX MICROSCOPIC
Bilirubin Urine: NEGATIVE
Glucose, UA: NEGATIVE mg/dL
Ketones, ur: NEGATIVE mg/dL
Nitrite: POSITIVE — AB
Protein, ur: 30 mg/dL — AB
Specific Gravity, Urine: 1.011 (ref 1.005–1.030)
pH: 8 (ref 5.0–8.0)

## 2016-12-06 MED ORDER — NITROFURANTOIN MONOHYD MACRO 100 MG PO CAPS
100.0000 mg | ORAL_CAPSULE | Freq: Once | ORAL | Status: AC
  Administered 2016-12-06: 100 mg via ORAL
  Filled 2016-12-06: qty 1

## 2016-12-06 MED ORDER — NITROFURANTOIN MONOHYD MACRO 100 MG PO CAPS: 100.0000 mg | ORAL_CAPSULE | Freq: Two times a day (BID) | ORAL | 0 refills | Status: DC

## 2016-12-06 NOTE — ED Notes (Signed)
[facility] called for transport.  

## 2016-12-06 NOTE — ED Triage Notes (Signed)
Pt comes from [facility] via EMS with complaints of possible UTI. They in & out cathed her earlier and they noted some blood in her urine and the patient complained of pain. A&O x4. Vitals WNL.

## 2016-12-06 NOTE — ED Provider Notes (Signed)
[facility]-EMERGENCY DEPT Provider Note   CSN: [id number] Arrival date & time: 12/06/16  2125   By signing my name below, I, Soijett Blue, attest that this documentation has been prepared under the direction and in the presence of Raeford Razor, MD. Electronically Signed: Soijett Blue, ED Scribe. 12/06/16. 10:06 PM.  History   Chief Complaint Chief Complaint  Patient presents with  . Groin Pain    HPI Kathy Peterson is a 83 y.o. female with a PMHx of HTN, DM, who presents to the Emergency Department BIB EMS from [facility] complaining of RLQ abdominal pain onset 2 days ago. Pt reports associated dysuria, hematuria, nausea, diarrhea, HA, SOB, transient left sided CP, and productive cough. Pt has not tried any medications for the relief of her symptoms. She denies vomiting, fever, and any other symptoms.    The history is provided by the patient. No language interpreter was used.    Past Medical History:  Diagnosis Date  . Arthritis    rheumatoid  . CHF (congestive heart failure) (HCC)   . COPD (chronic obstructive pulmonary disease) (HCC)   . Depression   . Diabetes mellitus without complication (HCC)    type 2  . Failure to thrive in adult   . Frequent falls   . GERD (gastroesophageal reflux disease)   . Hypertension   . Hyponatremia   . Orthostatic hypotension   . Pacemaker   . PAF (paroxysmal atrial fibrillation) (HCC)    a. ? mentioned in notes, no further details available.  . Pneumonia   . Polio   . Secondhand smoke exposure   . Shortness of breath   . Thyroid disease    hypothyroid    Patient Active Problem List   Diagnosis Date Noted  . MDD (major depressive disorder), recurrent severe, without psychosis (HCC) 04/08/2016  . COPD exacerbation (HCC) 04/08/2016  . History of pacemaker 04/08/2016  . QT prolongation 04/08/2016  . Episode of recurrent major depressive disorder (HCC)   . FTT (failure to thrive) in adult   . Hyponatremia 12/24/2015  . Rib  fractures 12/23/2015  . Atrial fibrillation (HCC) 12/23/2015  . CHF (congestive heart failure) (HCC) 12/23/2015  . COPD (chronic obstructive pulmonary disease) (HCC) 12/23/2015  . Pre-diabetes 12/23/2015  . Essential (primary) hypertension 12/23/2015  . Hypothyroidism 12/23/2015  . Recurrent falls 12/23/2015    Past Surgical History:  Procedure Laterality Date  . ABDOMINAL HYSTERECTOMY    . CHOLECYSTECTOMY    . EYE SURGERY      OB History    Gravida Para Term Preterm AB Living   2 2 2     2    SAB TAB Ectopic Multiple Live Births           2       Home Medications    Prior to Admission medications   Medication Sig Start Date End Date Taking? Authorizing Provider  acetaminophen (TYLENOL) 500 MG tablet Take 500 mg by mouth 2 (two) times daily.     [provider]  albuterol (PROVENTIL HFA;VENTOLIN HFA) 108 (90 Base) MCG/ACT inhaler Inhale 2 puffs into the lungs every 6 (six) hours as needed for wheezing or shortness of breath.    [provider]  ARIPiprazole (ABILIFY) 2 MG tablet Take 2 mg by mouth daily with breakfast.     [provider]  Calcium Carb-Cholecalciferol (CALCIUM 600+D) 600-800 MG-UNIT TABS Take 1 tablet by mouth daily with breakfast.     [provider]  Carboxymethylcell-Glycerin  PF (OPTIVE SENSITIVE) 0.5-0.9 % SOLN Place 1 drop into both eyes every 12 (twelve) hours.    [provider]  clonazePAM (KLONOPIN) 0.25 MG disintegrating tablet Take 0.25 mg by mouth daily with breakfast.    [provider]  diltiazem (DILACOR XR) 120 MG 24 hr capsule Take 120 mg by mouth at bedtime. Hold if pulse is less than 55 or if systolic less than 100.    [provider]  docusate sodium (COLACE) 100 MG capsule Take 1 capsule (100 mg total) by mouth 2 (two) times daily. Patient taking differently: Take 100 mg by mouth every Monday, Wednesday, and Friday.  12/27/15   Dhungel, Nishant, MD  DULoxetine (CYMBALTA) 20 MG  capsule Take 40 mg by mouth daily with breakfast.     [provider]  feeding supplement, ENSURE ENLIVE, (ENSURE ENLIVE) LIQD Take 237 mLs by mouth 2 (two) times daily between meals. 04/12/16   Albertine Grates, MD  fluticasone-salmeterol (ADVAIR HFA) [phone] MCG/ACT inhaler Inhale 2 puffs into the lungs 2 (two) times daily.    [provider]  furosemide (LASIX) 20 MG tablet Take 0.5 tablets (10 mg total) by mouth daily as needed for fluid or edema. Patient not taking: Reported on 05/03/2016 04/12/16   Albertine Grates, MD  guaiFENesin (MUCINEX) 600 MG 12 hr tablet Take 1 tablet (600 mg total) by mouth 2 (two) times daily. Patient not taking: Reported on 06/18/2016 04/12/16   Albertine Grates, MD  ipratropium (ATROVENT) 0.02 % nebulizer solution Take 0.5 mg by nebulization every 6 (six) hours as needed for wheezing or shortness of breath.    [provider]  levothyroxine (SYNTHROID, LEVOTHROID) 100 MCG tablet Take 1 tablet (100 mcg total) by mouth daily before breakfast. Patient not taking: Reported on 06/18/2016 04/13/16   Albertine Grates, MD  levothyroxine (SYNTHROID, LEVOTHROID) 88 MCG tablet Take 88 mcg by mouth at bedtime.    [provider]  loratadine (CLARITIN) 10 MG tablet Take 10 mg by mouth daily with breakfast.     [provider]  losartan (COZAAR) 25 MG tablet Take 25 mg by mouth daily with breakfast.     [provider]  Melatonin 3 MG TABS Take 3 mg by mouth at bedtime.    [provider]  Menthol, Topical Analgesic, (BIOFREEZE) 4 % GEL Apply topically 3 (three) times daily. Patient applies to left side and back    [provider]  Menthol, Topical Analgesic, 5 % GEL Apply 1 application topically 3 (three) times daily. Apply to left side and back    [provider]  Menthol, Topical Analgesic, 5 % GEL Apply 1 application topically daily as needed (for chest pain). Apply to chest if needed for pain    [provider]    oseltamivir (TAMIFLU) 75 MG capsule Take 75 mg by mouth every other day. Started 01/27 for 8 days    [provider]  pantoprazole (PROTONIX) 20 MG tablet Take 20 mg by mouth daily with breakfast.     [provider]  psyllium (REGULOID) 0.52 g capsule Take 0.52 g by mouth at bedtime.    [provider]  traMADol (ULTRAM) 50 MG tablet Take 0.5 tablets (25 mg total) by mouth every 8 (eight) hours as needed (pain). Give 1/2 to 1 tablet by mouth every 8 hours as needed for pain that is uncontrolled by tylenol. Patient taking differently: Take 25 mg by mouth every 8 (eight) hours as needed (for pain).  04/12/16   Albertine Grates, MD  traZODone (DESYREL) 50 MG tablet Take 25 mg by mouth daily as needed for sleep.     [provider]    Family History Family History  Problem Relation Age of Onset  . Heart disease Mother   . Stroke Neg Hx   . Diabetes Neg Hx   . Cancer Neg Hx     Social History Social History  Substance Use Topics  . Smoking status: Never Smoker  . Smokeless tobacco: Never Used  . Alcohol use No     Allergies   Bactrim [sulfamethoxazole-trimethoprim]; Penicillins; Pneumovax [pneumococcal polysaccharide vaccine]; Statins; and Sulfa antibiotics   Review of Systems Review of Systems  Constitutional: Negative for fever.  Respiratory: Positive for cough and shortness of breath.   Cardiovascular: Positive for chest pain.  Gastrointestinal: Positive for abdominal pain, diarrhea and nausea. Negative for vomiting.  Genitourinary: Positive for dysuria and hematuria.  Neurological: Positive for headaches.     Physical Exam Updated Vital Signs BP (!) 161/88 (BP Location: Left Arm)   Pulse 87   Temp 97.9 F (36.6 C) (Oral)   Resp 20   LMP  (LMP Unknown)   SpO2 98%   Physical Exam  Constitutional: She is oriented to person, place, and time. She appears well-developed and well-nourished. No distress.  HENT:  Head: Normocephalic and  atraumatic.  Eyes: EOM are normal.  Neck: Neck supple.  Cardiovascular: Normal rate, regular rhythm and normal heart sounds.  Exam reveals no gallop and no friction rub.   No murmur heard. Pulmonary/Chest: Effort normal and breath sounds normal. No respiratory distress. She has no wheezes. She has no rales.  Abdominal: Soft. She exhibits no distension. Bowel sounds are increased. There is tenderness in the right lower quadrant and suprapubic area.  Suprapubic and RLQ TTP. Hyperactive bowel sounds.  Musculoskeletal: Normal range of motion. She exhibits edema.  Mild symmetric BLE edema.   Neurological: She is alert and oriented to person, place, and time.  Skin: Skin is warm and dry.  Psychiatric: She has a normal mood and affect. Her behavior is normal.  Nursing note and vitals reviewed.    ED Treatments / Results  DIAGNOSTIC STUDIES: Oxygen Saturation is 98% on RA, nl by my interpretation.    COORDINATION OF CARE: 10:04 PM Discussed treatment plan with pt at bedside and pt agreed to plan.   Labs (all labs ordered are listed, but only abnormal results are displayed) Labs Reviewed  URINE CULTURE - Abnormal; Notable for the following:       Result Value   Culture >=100,000 COLONIES/mL PROTEUS MIRABILIS (*)    Organism ID, Bacteria PROTEUS MIRABILIS (*)    All other components within normal limits  URINALYSIS, ROUTINE W REFLEX MICROSCOPIC - Abnormal; Notable for the following:    APPearance CLOUDY (*)    Hgb urine dipstick LARGE (*)    Protein, ur 30 (*)    Nitrite POSITIVE (*)    Leukocytes, UA LARGE (*)    Bacteria, UA MANY (*)    Squamous Epithelial / LPF 0-5 (*)    All other components within normal limits    EKG  EKG Interpretation None       Radiology No results found.  Procedures Procedures (including critical care time)  Medications Ordered in ED Medications - No data to display   Initial Impression / Assessment and Plan / ED Course  I have reviewed  the triage vital signs and the nursing notes.  Pertinent labs & imaging results that were available during my care of the patient were reviewed by me and considered in my medical decision making (see chart for details).     84 year old female with abdominal pain. Likely from urinary tract infection. Penicillin and Bactrim allergies. Course of macrobid. outpt FU.   Final Clinical Impressions(s) / ED Diagnoses   Final diagnoses:  Acute cystitis with hematuria    New Prescriptions Discharge Medication List as of 12/06/2016 10:26 PM    START taking these medications   Details  nitrofurantoin, macrocrystal-monohydrate, (MACROBID) 100 MG capsule Take 1 capsule (100 mg total) by mouth 2 (two) times daily., Starting Sat 12/06/2016, Print       I personally preformed the services scribed in my presence. The recorded information has been reviewed is accurate. Raeford Razor, MD.     Raeford Razor, MD 12/21/16 [phone]

## 2016-12-06 NOTE — ED Notes (Signed)
Bed: UD14 Expected date:  Expected time:  Means of arrival:  Comments: 39f poss uti

## 2016-12-09 LAB — URINE CULTURE: Culture: >=100000 — AB

## 2016-12-10 ENCOUNTER — Telehealth: Payer: Self-pay | Admitting: Emergency Medicine

## 2016-12-10 NOTE — Telephone Encounter (Signed)
Post ED Visit - Positive Culture Follow-up: Successful Patient Follow-Up  Culture assessed and recommendations reviewed by: []  , Pharm.D. []  Enzo Bi, Pharm.D., BCPS AQ-ID []  , Pharm.D., BCPS []  Celedonio Miyamoto, Pharm.D., BCPS []  [place], Garvin Fila.D., BCPS, AAHIVP []  , Pharm.D., BCPS, AAHIVP []  Georgina Pillion, PharmD, BCPS []  , PharmD, BCPS []  [place], PharmD, BCPS [address] PharmD  Positive urine culture  []  Patient discharged without antimicrobial prescription and treatment is now indicated [x]  Organism is resistant to prescribed ED discharge antimicrobial []  Patient with positive blood cultures  Changes discussed with ED provider: PA New antibiotic prescription stop Nitrofurantoin , start Cephalexin 250mg  every 12 hours x 7 days  Info with new rx faxed to [facility] @ [phone]  Lysle Pearl 12/10/2016, 12:41 PM

## 2016-12-10 NOTE — Progress Notes (Signed)
ED Antimicrobial Stewardship Positive Culture Follow Up   Kathy Peterson is an 83 y.o. female who presented to [facility] on 12/06/2016 with a chief complaint of  Chief Complaint  Patient presents with  . Groin Pain    Recent Results (from the past 720 hour(s))  Urine culture     Status: Abnormal   Collection Time: 12/06/16  9:40 PM  Result Value Ref Range Status   Specimen Description URINE, RANDOM  Final   Special Requests NONE  Final   Culture >=100,000 COLONIES/mL PROTEUS MIRABILIS (A)  Final   Report Status 12/09/2016 FINAL  Final   Organism ID, Bacteria PROTEUS MIRABILIS (A)  Final      Susceptibility   Proteus mirabilis - MIC*    AMPICILLIN <=2 SENSITIVE Sensitive     CEFAZOLIN <=4 SENSITIVE Sensitive     CEFTRIAXONE <=1 SENSITIVE Sensitive     CIPROFLOXACIN <=0.25 SENSITIVE Sensitive     GENTAMICIN <=1 SENSITIVE Sensitive     IMIPENEM 4 SENSITIVE Sensitive     NITROFURANTOIN 128 RESISTANT Resistant     TRIMETH/SULFA <=20 SENSITIVE Sensitive     AMPICILLIN/SULBACTAM <=2 SENSITIVE Sensitive     PIP/TAZO <=4 SENSITIVE Sensitive     * >=100,000 COLONIES/mL PROTEUS MIRABILIS    [x]  Treated with nitrofurantoin, organism resistant to prescribed antimicrobial []  Patient discharged originally without antimicrobial agent and treatment is now indicated  New antibiotic prescription: Cephalexin 250 mg every 12 hours for 7 days  ED Provider: , PA-C   , PharmD 12/10/2016, 9:23 AM Infectious Diseases Pharmacist Phone# [phone]

## 2016-12-14 ENCOUNTER — Emergency Department (HOSPITAL_COMMUNITY): Payer: Medicare HMO

## 2016-12-14 ENCOUNTER — Inpatient Hospital Stay (HOSPITAL_COMMUNITY)
Admission: EM | Admit: 2016-12-14 | Discharge: 2016-12-17 | DRG: 682 | Disposition: A | Discharge status: Skilled Nursing Facility | Payer: Medicare HMO | Attending: Internal Medicine | Admitting: Internal Medicine

## 2016-12-14 ENCOUNTER — Encounter (HOSPITAL_COMMUNITY): Payer: Self-pay | Admitting: Emergency Medicine

## 2016-12-14 DIAGNOSIS — Z880 Allergy status to penicillin: Secondary | ICD-10-CM

## 2016-12-14 DIAGNOSIS — N390 Urinary tract infection, site not specified: Secondary | ICD-10-CM | POA: Diagnosis present

## 2016-12-14 DIAGNOSIS — A419 Sepsis, unspecified organism: Secondary | ICD-10-CM

## 2016-12-14 DIAGNOSIS — M069 Rheumatoid arthritis, unspecified: Secondary | ICD-10-CM | POA: Diagnosis present

## 2016-12-14 DIAGNOSIS — E871 Hypo-osmolality and hyponatremia: Secondary | ICD-10-CM | POA: Diagnosis present

## 2016-12-14 DIAGNOSIS — E119 Type 2 diabetes mellitus without complications: Secondary | ICD-10-CM | POA: Diagnosis present

## 2016-12-14 DIAGNOSIS — Z7951 Long term (current) use of inhaled steroids: Secondary | ICD-10-CM

## 2016-12-14 DIAGNOSIS — Z66 Do not resuscitate: Secondary | ICD-10-CM | POA: Diagnosis present

## 2016-12-14 DIAGNOSIS — I4581 Long QT syndrome: Secondary | ICD-10-CM | POA: Diagnosis not present

## 2016-12-14 DIAGNOSIS — F05 Delirium due to known physiological condition: Secondary | ICD-10-CM | POA: Diagnosis not present

## 2016-12-14 DIAGNOSIS — E039 Hypothyroidism, unspecified: Secondary | ICD-10-CM | POA: Diagnosis not present

## 2016-12-14 DIAGNOSIS — F39 Unspecified mood [affective] disorder: Secondary | ICD-10-CM | POA: Diagnosis present

## 2016-12-14 DIAGNOSIS — J449 Chronic obstructive pulmonary disease, unspecified: Secondary | ICD-10-CM | POA: Diagnosis present

## 2016-12-14 DIAGNOSIS — I480 Paroxysmal atrial fibrillation: Secondary | ICD-10-CM | POA: Diagnosis present

## 2016-12-14 DIAGNOSIS — F0391 Unspecified dementia with behavioral disturbance: Secondary | ICD-10-CM | POA: Diagnosis not present

## 2016-12-14 DIAGNOSIS — I509 Heart failure, unspecified: Secondary | ICD-10-CM | POA: Diagnosis not present

## 2016-12-14 DIAGNOSIS — F0390 Unspecified dementia without behavioral disturbance: Secondary | ICD-10-CM | POA: Diagnosis present

## 2016-12-14 DIAGNOSIS — R4182 Altered mental status, unspecified: Secondary | ICD-10-CM | POA: Diagnosis present

## 2016-12-14 DIAGNOSIS — Z79899 Other long term (current) drug therapy: Secondary | ICD-10-CM

## 2016-12-14 DIAGNOSIS — E860 Dehydration: Secondary | ICD-10-CM | POA: Diagnosis present

## 2016-12-14 DIAGNOSIS — J189 Pneumonia, unspecified organism: Secondary | ICD-10-CM

## 2016-12-14 DIAGNOSIS — Z781 Physical restraint status: Secondary | ICD-10-CM

## 2016-12-14 DIAGNOSIS — R404 Transient alteration of awareness: Secondary | ICD-10-CM

## 2016-12-14 DIAGNOSIS — Z950 Presence of cardiac pacemaker: Secondary | ICD-10-CM

## 2016-12-14 DIAGNOSIS — I472 Ventricular tachycardia: Secondary | ICD-10-CM | POA: Diagnosis not present

## 2016-12-14 DIAGNOSIS — Z888 Allergy status to other drugs, medicaments and biological substances status: Secondary | ICD-10-CM | POA: Diagnosis not present

## 2016-12-14 DIAGNOSIS — I110 Hypertensive heart disease with heart failure: Secondary | ICD-10-CM | POA: Diagnosis present

## 2016-12-14 DIAGNOSIS — R627 Adult failure to thrive: Secondary | ICD-10-CM | POA: Diagnosis present

## 2016-12-14 DIAGNOSIS — F329 Major depressive disorder, single episode, unspecified: Secondary | ICD-10-CM | POA: Diagnosis present

## 2016-12-14 DIAGNOSIS — I4891 Unspecified atrial fibrillation: Secondary | ICD-10-CM | POA: Diagnosis present

## 2016-12-14 DIAGNOSIS — Z881 Allergy status to other antibiotic agents status: Secondary | ICD-10-CM | POA: Diagnosis not present

## 2016-12-14 DIAGNOSIS — N179 Acute kidney failure, unspecified: Secondary | ICD-10-CM | POA: Diagnosis not present

## 2016-12-14 DIAGNOSIS — K219 Gastro-esophageal reflux disease without esophagitis: Secondary | ICD-10-CM | POA: Diagnosis present

## 2016-12-14 DIAGNOSIS — I959 Hypotension, unspecified: Secondary | ICD-10-CM | POA: Diagnosis present

## 2016-12-14 DIAGNOSIS — G9341 Metabolic encephalopathy: Secondary | ICD-10-CM

## 2016-12-14 DIAGNOSIS — J181 Lobar pneumonia, unspecified organism: Secondary | ICD-10-CM

## 2016-12-14 LAB — I-STAT CHEM 8, ED
BUN: 34 mg/dL — ABNORMAL HIGH (ref 6–20)
CALCIUM ION: 1.19 mmol/L (ref 1.15–1.40)
CREATININE: 1.5 mg/dL — AB (ref 0.44–1.00)
Chloride: 91 mmol/L — ABNORMAL LOW (ref 101–111)
Glucose, Bld: 172 mg/dL — ABNORMAL HIGH (ref 65–99)
HEMATOCRIT: 42 % (ref 36.0–46.0)
HEMOGLOBIN: 14.3 g/dL (ref 12.0–15.0)
Potassium: 4.4 mmol/L (ref 3.5–5.1)
Sodium: 130 mmol/L — ABNORMAL LOW (ref 135–145)
TCO2: 28 mmol/L (ref 0–100)

## 2016-12-14 LAB — URINALYSIS, ROUTINE W REFLEX MICROSCOPIC
BILIRUBIN URINE: NEGATIVE
Glucose, UA: NEGATIVE mg/dL
HGB URINE DIPSTICK: NEGATIVE
KETONES UR: NEGATIVE mg/dL
Nitrite: NEGATIVE
PH: 5 (ref 5.0–8.0)
Protein, ur: NEGATIVE mg/dL
Specific Gravity, Urine: 1.019 (ref 1.005–1.030)

## 2016-12-14 LAB — CBC WITH DIFFERENTIAL/PLATELET
Basophils Absolute: 0 10*3/uL (ref 0.0–0.1)
Basophils Relative: 0 %
Eosinophils Absolute: 0.1 10*3/uL (ref 0.0–0.7)
Eosinophils Relative: 0 %
HCT: 39.4 % (ref 36.0–46.0)
HEMOGLOBIN: 13.7 g/dL (ref 12.0–15.0)
LYMPHS ABS: 1.3 10*3/uL (ref 0.7–4.0)
LYMPHS PCT: 12 %
MCH: 29.1 pg (ref 26.0–34.0)
MCHC: 34.8 g/dL (ref 30.0–36.0)
MCV: 83.8 fL (ref 78.0–100.0)
Monocytes Absolute: 0.9 10*3/uL (ref 0.1–1.0)
Monocytes Relative: 8 %
NEUTROS ABS: 9 10*3/uL — AB (ref 1.7–7.7)
NEUTROS PCT: 80 %
Platelets: 246 10*3/uL (ref 150–400)
RBC: 4.7 MIL/uL (ref 3.87–5.11)
RDW: 12.8 % (ref 11.5–15.5)
WBC: 11.3 10*3/uL — AB (ref 4.0–10.5)

## 2016-12-14 LAB — COMPREHENSIVE METABOLIC PANEL
ALT: 13 U/L — ABNORMAL LOW (ref 14–54)
AST: 21 U/L (ref 15–41)
Albumin: 3.5 g/dL (ref 3.5–5.0)
Alkaline Phosphatase: 69 U/L (ref 38–126)
Anion gap: 10 (ref 5–15)
BUN: 30 mg/dL — ABNORMAL HIGH (ref 6–20)
CHLORIDE: 93 mmol/L — AB (ref 101–111)
CO2: 24 mmol/L (ref 22–32)
Calcium: 9.2 mg/dL (ref 8.9–10.3)
Creatinine, Ser: 1.71 mg/dL — ABNORMAL HIGH (ref 0.44–1.00)
GFR, EST AFRICAN AMERICAN: 30 mL/min — AB (ref >60)
GFR, EST NON AFRICAN AMERICAN: 26 mL/min — AB (ref >60)
Glucose, Bld: 179 mg/dL — ABNORMAL HIGH (ref 65–99)
POTASSIUM: 4.5 mmol/L (ref 3.5–5.1)
Sodium: 127 mmol/L — ABNORMAL LOW (ref 135–145)
Total Bilirubin: 0.5 mg/dL (ref 0.3–1.2)
Total Protein: 5.7 g/dL — ABNORMAL LOW (ref 6.5–8.1)

## 2016-12-14 LAB — I-STAT CG4 LACTIC ACID, ED
LACTIC ACID, VENOUS: 3.04 mmol/L — AB (ref 0.5–1.9)
LACTIC ACID, VENOUS: 3.29 mmol/L — AB (ref 0.5–1.9)

## 2016-12-14 LAB — MRSA PCR SCREENING: MRSA by PCR: NEGATIVE

## 2016-12-14 LAB — I-STAT TROPONIN, ED: Troponin i, poc: 0.06 ng/mL (ref 0.00–0.08)

## 2016-12-14 LAB — LACTIC ACID, PLASMA
LACTIC ACID, VENOUS: 1.7 mmol/L (ref 0.5–1.9)
LACTIC ACID, VENOUS: 1.1 mmol/L (ref 0.5–1.9)

## 2016-12-14 LAB — GLUCOSE, CAPILLARY: GLUCOSE-CAPILLARY: 106 mg/dL — AB (ref 65–99)

## 2016-12-14 MED ORDER — LORATADINE 10 MG PO TABS
10.0000 mg | ORAL_TABLET | Freq: Every day | ORAL | Status: DC
  Administered 2016-12-15 – 2016-12-17 (×3): 10 mg via ORAL
  Filled 2016-12-14 (×3): qty 1

## 2016-12-14 MED ORDER — SODIUM CHLORIDE 0.9 % IV BOLUS (SEPSIS)
1000.0000 mL | Freq: Once | INTRAVENOUS | Status: AC
  Administered 2016-12-14: 1000 mL via INTRAVENOUS

## 2016-12-14 MED ORDER — CLONAZEPAM 0.125 MG PO TBDP
0.2500 mg | ORAL_TABLET | Freq: Every day | ORAL | Status: DC
  Administered 2016-12-15 – 2016-12-17 (×3): 0.25 mg via ORAL
  Filled 2016-12-14 (×3): qty 2

## 2016-12-14 MED ORDER — VANCOMYCIN HCL 10 G IV SOLR
1500.0000 mg | Freq: Once | INTRAVENOUS | Status: AC
  Administered 2016-12-14: 1500 mg via INTRAVENOUS
  Filled 2016-12-14: qty 1500

## 2016-12-14 MED ORDER — PSYLLIUM 95 % PO PACK
1.0000 | PACK | Freq: Every day | ORAL | Status: DC
  Administered 2016-12-14 – 2016-12-16 (×3): 1 via ORAL
  Filled 2016-12-14 (×3): qty 1

## 2016-12-14 MED ORDER — ARIPIPRAZOLE 2 MG PO TABS: 2.0000 mg | ORAL_TABLET | Freq: Every day | ORAL | Status: DC

## 2016-12-14 MED ORDER — DULOXETINE HCL 60 MG PO CPEP
60.0000 mg | ORAL_CAPSULE | Freq: Every day | ORAL | Status: DC
  Administered 2016-12-15 – 2016-12-17 (×3): 60 mg via ORAL
  Filled 2016-12-14 (×3): qty 1

## 2016-12-14 MED ORDER — MUSCLE RUB 10-15 % EX CREA
1.0000 "application " | TOPICAL_CREAM | Freq: Three times a day (TID) | CUTANEOUS | Status: DC
  Administered 2016-12-14 – 2016-12-17 (×7): 1 via TOPICAL
  Filled 2016-12-14: qty 85

## 2016-12-14 MED ORDER — ENOXAPARIN SODIUM 30 MG/0.3ML ~~LOC~~ SOLN
30.0000 mg | SUBCUTANEOUS | Status: DC
  Administered 2016-12-14: 30 mg via SUBCUTANEOUS
  Filled 2016-12-14: qty 0.3

## 2016-12-14 MED ORDER — ENSURE ENLIVE PO LIQD
237.0000 mL | Freq: Two times a day (BID) | ORAL | Status: DC
  Administered 2016-12-15 – 2016-12-17 (×4): 237 mL via ORAL

## 2016-12-14 MED ORDER — SODIUM CHLORIDE 0.9 % IV SOLN: INTRAVENOUS | Status: AC

## 2016-12-14 MED ORDER — DOCUSATE SODIUM 100 MG PO CAPS
100.0000 mg | ORAL_CAPSULE | ORAL | Status: DC
  Administered 2016-12-15 – 2016-12-17 (×2): 100 mg via ORAL
  Filled 2016-12-14 (×2): qty 1

## 2016-12-14 MED ORDER — ACETAMINOPHEN 500 MG PO TABS
500.0000 mg | ORAL_TABLET | Freq: Two times a day (BID) | ORAL | Status: DC
  Administered 2016-12-14 – 2016-12-17 (×6): 500 mg via ORAL
  Filled 2016-12-14 (×6): qty 1

## 2016-12-14 MED ORDER — DEXTROSE 5 % IV SOLN
1.0000 g | INTRAVENOUS | Status: DC
  Administered 2016-12-15: 1 g via INTRAVENOUS
  Filled 2016-12-14 (×2): qty 1

## 2016-12-14 MED ORDER — IPRATROPIUM-ALBUTEROL 0.5-2.5 (3) MG/3ML IN SOLN
3.0000 mL | Freq: Four times a day (QID) | RESPIRATORY_TRACT | Status: DC
  Administered 2016-12-14 – 2016-12-15 (×5): 3 mL via RESPIRATORY_TRACT
  Filled 2016-12-14 (×5): qty 3

## 2016-12-14 MED ORDER — SODIUM CHLORIDE 1 G PO TABS
1.0000 g | ORAL_TABLET | Freq: Every day | ORAL | Status: DC
  Administered 2016-12-15 – 2016-12-17 (×3): 1 g via ORAL
  Filled 2016-12-14 (×3): qty 1

## 2016-12-14 MED ORDER — DILTIAZEM HCL 60 MG PO TABS
60.0000 mg | ORAL_TABLET | Freq: Two times a day (BID) | ORAL | Status: DC
  Administered 2016-12-14 – 2016-12-17 (×6): 60 mg via ORAL
  Filled 2016-12-14 (×6): qty 1

## 2016-12-14 MED ORDER — MOMETASONE FURO-FORMOTEROL FUM 200-5 MCG/ACT IN AERO
2.0000 | INHALATION_SPRAY | Freq: Two times a day (BID) | RESPIRATORY_TRACT | Status: DC
  Administered 2016-12-14 – 2016-12-17 (×5): 2 via RESPIRATORY_TRACT
  Filled 2016-12-14 (×2): qty 8.8

## 2016-12-14 MED ORDER — VANCOMYCIN HCL IN DEXTROSE 750-5 MG/150ML-% IV SOLN
750.0000 mg | INTRAVENOUS | Status: DC
  Filled 2016-12-14: qty 150

## 2016-12-14 MED ORDER — LEVOTHYROXINE SODIUM 88 MCG PO TABS
88.0000 ug | ORAL_TABLET | Freq: Every day | ORAL | Status: DC
  Administered 2016-12-14 – 2016-12-16 (×3): 88 ug via ORAL
  Filled 2016-12-14 (×3): qty 1

## 2016-12-14 MED ORDER — MELATONIN 3 MG PO TABS
3.0000 mg | ORAL_TABLET | Freq: Every day | ORAL | Status: DC
  Administered 2016-12-14 – 2016-12-16 (×3): 3 mg via ORAL
  Filled 2016-12-14 (×4): qty 1

## 2016-12-14 MED ORDER — DEXTROSE 5 % IV SOLN
1.0000 g | Freq: Once | INTRAVENOUS | Status: AC
  Administered 2016-12-14: 1 g via INTRAVENOUS
  Filled 2016-12-14: qty 1

## 2016-12-14 MED ORDER — POLYVINYL ALCOHOL 1.4 % OP SOLN
1.0000 [drp] | Freq: Two times a day (BID) | OPHTHALMIC | Status: DC
  Administered 2016-12-15 – 2016-12-17 (×5): 1 [drp] via OPHTHALMIC
  Filled 2016-12-14 (×2): qty 15

## 2016-12-14 NOTE — ED Provider Notes (Signed)
[facility]-EMER[facility]ENCY DEPT Provider Note   CSN: [id number] Arrival date & time: 12/14/16  1203     History   Chief Complaint Chief Complaint  Patient presents with  . Hypotension  . Altered Mental Status    HPI Kathy Peterson is a 83 y.o. female.  83 yo F with a chief complaint of altered mental status. Patient is different from her baseline per the nursing home. She is mildly confused on my exam. Level V caveat dementia. The patient was recently seen here in the ED about a week ago diagnosed with urinary tract infection and started on nitrofurantoin. Her cultures and sensitivities returned and it was resistant to nitrofurantoin. She was then switched to cephalosporin. No fevers or chills per the nursing home.   The history is provided by the patient and the EMS personnel.  Altered Mental Status   This is a new problem. The current episode started less than 1 hour ago. The problem has not changed since onset.Associated symptoms include weakness.  Illness  This is a new problem. The current episode started 3 to 5 hours ago. The problem occurs constantly. The problem has been rapidly improving. Pertinent negatives include no chest pain, no headaches and no shortness of breath. Nothing aggravates the symptoms. Nothing relieves the symptoms. She has tried nothing for the symptoms. The treatment provided no relief.    Past Medical History:  Diagnosis Date  . Arthritis    rheumatoid  . CHF (congestive heart failure) (HCC)   . COPD (chronic obstructive pulmonary disease) (HCC)   . Depression   . Diabetes mellitus without complication (HCC)    type 2  . Failure to thrive in adult   . Frequent falls   . GERD (gastroesophageal reflux disease)   . Hypertension   . Hyponatremia   . Orthostatic hypotension   . Pacemaker   . PAF (paroxysmal atrial fibrillation) (HCC)    a. ? mentioned in notes, no further details available.  . Pneumonia   . Polio   . Secondhand smoke exposure   . Shortness  of breath   . Thyroid disease    hypothyroid    Patient Active Problem List   Diagnosis Date Noted  . MDD (major depressive disorder), recurrent severe, without psychosis (HCC) 04/08/2016  . COPD exacerbation (HCC) 04/08/2016  . History of pacemaker 04/08/2016  . QT prolongation 04/08/2016  . Episode of recurrent major depressive disorder (HCC)   . FTT (failure to thrive) in adult   . Hyponatremia 12/24/2015  . Rib fractures 12/23/2015  . Atrial fibrillation (HCC) 12/23/2015  . CHF (congestive heart failure) (HCC) 12/23/2015  . COPD (chronic obstructive pulmonary disease) (HCC) 12/23/2015  . Pre-diabetes 12/23/2015  . Essential (primary) hypertension 12/23/2015  . Hypothyroidism 12/23/2015  . Recurrent falls 12/23/2015    Past Surgical History:  Procedure Laterality Date  . ABDOMINAL HYSTERECTOMY    . CHOLECYSTECTOMY    . EYE SURGERY      OB History    Gravida Para Term Preterm AB Living   2 2 2     2    SAB TAB Ectopic Multiple Live Births           2       Home Medications    Prior to Admission medications   Medication Sig Start Date End Date Taking? Authorizing Provider  acetaminophen (TYLENOL) 500 MG tablet Take 500 mg by mouth 2 (two) times daily.    Yes [provider]  albuterol (PROVENTIL HFA;VENTOLIN HFA)  108 (90 Base) MCG/ACT inhaler Inhale 2 puffs into the lungs every 6 (six) hours as needed for wheezing or shortness of breath.   Yes [provider]  ARIPiprazole (ABILIFY) 2 MG tablet Take 2 mg by mouth daily with breakfast.    Yes [provider]  Calcium Carb-Cholecalciferol (CALCIUM 600+D) 600-800 MG-UNIT TABS Take 1 tablet by mouth daily with breakfast.    Yes [provider]  Carboxymethylcellul-Glycerin (OPTIVE) 0.5-0.9 % SOLN Place 1 drop into both eyes every 12 (twelve) hours.   Yes [provider]  cephALEXin (KEFLEX) 250 MG capsule Take 250 mg by mouth 2 (two) times daily. 12/10/16  Yes [provider]  clonazePAM (KLONOPIN) 0.25 MG disintegrating tablet Take 0.25 mg by mouth daily with breakfast.   Yes [provider]  diltiazem (CARDIZEM) 60 MG tablet Take 60 mg by mouth 2 (two) times daily.   Yes [provider]  docusate sodium (COLACE) 100 MG capsule Take 1 capsule (100 mg total) by mouth 2 (two) times daily. Patient taking differently: Take 100 mg by mouth every Monday, Wednesday, and Friday.  12/27/15  Yes Dhungel, Nishant, MD  DULoxetine (CYMBALTA) 60 MG capsule Take 60 mg by mouth daily with breakfast.    Yes [provider]  feeding supplement, ENSURE ENLIVE, (ENSURE ENLIVE) LIQD Take 237 mLs by mouth 2 (two) times daily between meals. 04/12/16  Yes Albertine Grates, MD  fluticasone-salmeterol (ADVAIR HFA) [phone] MCG/ACT inhaler Inhale 2 puffs into the lungs 2 (two) times daily.   Yes [provider]  ipratropium (ATROVENT) 0.02 % nebulizer solution Take 0.5 mg by nebulization every 6 (six) hours as needed for wheezing or shortness of breath.   Yes [provider]  levothyroxine (SYNTHROID, LEVOTHROID) 88 MCG tablet Take 88 mcg by mouth at bedtime.   Yes [provider]  loratadine (CLARITIN) 10 MG tablet Take 10 mg by mouth daily with breakfast.    Yes [provider]  losartan (COZAAR) 25 MG tablet Take 25 mg by mouth daily with breakfast.    Yes [provider]  Melatonin 3 MG TABS Take 3 mg by mouth at bedtime.   Yes [provider]  Menthol, Topical Analgesic, 5 % GEL Apply 1 application topically See admin instructions. Apply to left side and back 3 times daily. Apply to left chest as needed for chest pain   Yes [provider]  pantoprazole (PROTONIX) 20 MG tablet Take 20 mg by mouth daily with breakfast.    Yes [provider]  psyllium (REGULOID) 0.52 g capsule Take 0.52 g by mouth at bedtime.   Yes [provider]  sodium chloride 1 g tablet Take 1 g by mouth daily.    Yes [provider]  traMADol (ULTRAM) 50 MG tablet Take 0.5 tablets (25 mg total) by mouth every 8 (eight) hours as needed (pain). Give 1/2 to 1 tablet by mouth every 8 hours as needed for pain that is uncontrolled by tylenol. Patient taking differently: Take 25 mg by mouth every 8 (eight) hours as needed for moderate pain.  04/12/16  Yes Albertine Grates, MD  traZODone (DESYREL) 50 MG tablet Take 25 mg by mouth every 8 (eight) hours as needed for sleep.    Yes [provider]  furosemide (LASIX) 20 MG tablet Take 0.5 tablets (10 mg total) by mouth daily as needed for fluid or edema. Patient not taking: Reported on 05/03/2016 04/12/16   Albertine Grates, MD  guaiFENesin [facility])  600 MG 12 hr tablet Take 1 tablet (600 mg total) by mouth 2 (two) times daily. Patient not taking: Reported on 06/18/2016 04/12/16   Albertine Grates, MD    Family History Family History  Problem Relation Age of Onset  . Heart disease Mother   . Stroke Neg Hx   . Diabetes Neg Hx   . Cancer Neg Hx     Social History Social History  Substance Use Topics  . Smoking status: Never Smoker  . Smokeless tobacco: Never Used  . Alcohol use No     Allergies   Bactrim [sulfamethoxazole-trimethoprim]; Penicillins; Pneumovax [pneumococcal polysaccharide vaccine]; Statins; and Sulfa antibiotics   Review of Systems Review of Systems  Unable to perform ROS: Dementia  Constitutional: Negative for chills and fever.  HENT: Negative for congestion and rhinorrhea.   Eyes: Negative for redness and visual disturbance.  Respiratory: Negative for shortness of breath and wheezing.   Cardiovascular: Negative for chest pain and palpitations.  Gastrointestinal: Negative for nausea and vomiting.  Genitourinary: Negative for dysuria and urgency.  Musculoskeletal: Negative for arthralgias and myalgias.  Skin: Negative for pallor and wound.  Neurological: Positive for weakness. Negative for dizziness and headaches.     Physical  Exam Updated Vital Signs BP (!) 110/58   Pulse 99   Temp (!) 97 F (36.1 C) (Rectal)   Resp 15   Ht 5\' 2"  (1.575 m)   Wt 74.4 kg (164 lb)   LMP  (LMP Unknown)   SpO2 100%   BMI 30.00 kg/m   Physical Exam  Constitutional: She is oriented to person, place, and time. She appears well-developed and well-nourished. No distress.  HENT:  Head: Normocephalic and atraumatic.  Eyes: Pupils are equal, round, and reactive to light. EOM are normal.  Neck: Normal range of motion. Neck supple.  Cardiovascular: Normal rate and regular rhythm.  Exam reveals no gallop and no friction rub.   No murmur heard. Pulmonary/Chest: Effort normal. She has no wheezes. She has no rales.  Abdominal: Soft. She exhibits no distension. There is no tenderness.  Musculoskeletal: She exhibits no edema or tenderness.  Neurological: She is alert and oriented to person, place, and time.  Skin: Skin is warm and dry. She is not diaphoretic.  Psychiatric: She has a normal mood and affect. Her behavior is normal.  Nursing note and vitals reviewed.    ED Treatments / Results  Labs (all labs ordered are listed, but only abnormal results are displayed) Labs Reviewed  COMPREHENSIVE METABOLIC PANEL - Abnormal; Notable for the following:       Result Value   Sodium 127 (*)    Chloride 93 (*)    Glucose, Bld 179 (*)    BUN 30 (*)    Creatinine, Ser 1.71 (*)    Total Protein 5.7 (*)    ALT 13 (*)    GFR calc non Af Amer 26 (*)    GFR calc Af Amer 30 (*)    All other components within normal limits  CBC WITH DIFFERENTIAL/PLATELET - Abnormal; Notable for the following:    WBC 11.3 (*)    Neutro Abs 9.0 (*)    All other components within normal limits  URINALYSIS, ROUTINE W REFLEX MICROSCOPIC - Abnormal; Notable for the following:    APPearance HAZY (*)    Leukocytes, UA MODERATE (*)    Bacteria, UA RARE (*)    Squamous Epithelial / LPF 0-5 (*)    All other components within normal limits  I-STAT CG4 LACTIC  ACID, ED - Abnormal; Notable for the following:    Lactic Acid, Venous 3.04 (*)    All other components within normal limits  I-STAT CHEM 8, ED - Abnormal; Notable for the following:    Sodium 130 (*)    Chloride 91 (*)    BUN 34 (*)    Creatinine, Ser 1.50 (*)    Glucose, Bld 172 (*)    All other components within normal limits  CULTURE, BLOOD (ROUTINE X 2)  CULTURE, BLOOD (ROUTINE X 2)  URINE CULTURE  I-STAT TROPONIN, ED  I-STAT CG4 LACTIC ACID, ED    EKG  EKG Interpretation  Date/Time:  Sunday December 14 2016 12:10:56 EDT Ventricular Rate:  93 PR Interval:    QRS Duration: 169 QT Interval:  464 QTC Calculation: 578 R Axis:   -70 Text Interpretation:  Wandering atrial pacemaker Ventricular premature complex Biatrial enlargement LVH with IVCD, LAD and secondary repol abnrm Prolonged QT interval Probable RV involvement, suggest recording right precordial leads Baseline wander in lead(s) II III aVF with QRS widening No old tracing to compare Confirmed by Rulon Abdalla ([id number]) on 12/14/2016 12:16:28 PM       Radiology Dg Chest 2 View  Result Date: 12/14/2016 CLINICAL DATA:  Unresponsive patient. EXAM: CHEST  2 VIEW COMPARISON:  Chest radiograph 06/18/2016 FINDINGS: Multi lead pacer apparatus overlies the left hemithorax. Monitoring leads overlie the patient. Stable cardiac and mediastinal contours with tortuosity and calcification of the thoracic aorta. Heterogeneous opacities left lung base. No pleural effusion or pneumothorax. IMPRESSION: Heterogeneous opacities left lung base may represent atelectasis or infection. Electronically Signed   By: Drew  Davis M.D.   On: 12/14/2016 13:06    Procedures Procedures (including critical care time)  Medications Ordered in ED Medications  sodium chloride 0.9 % bolus 1,000 mL (not administered)  vancomycin (VANCOCIN) 1,500 mg in sodium chloride 0.9 % 500 mL IVPB (1,500 mg Intravenous New Bag/Given 12/14/16 1357)  ceFEPIme (MAXIPIME) 1 g in  dextrose 5 % 50 mL IVPB (not administered)  sodium chloride 0.9 % bolus 1,000 mL (1,000 mLs Intravenous New Bag/Given 12/14/16 1237)     Initial Impression / Assessment and Plan / ED Course  I have reviewed the triage vital signs and the nursing notes.  Pertinent labs & imaging results that were available during my care of the patient were reviewed by me and considered in my medical decision making (see chart for details).     83  yo F with a cc of ams.  Elevated lactic acid.  Initially hypotensive per EMS with BP in 70s.  Given fluids with improvement as well as improvement of mental status.  Thought now to be at baseline per ems. CXR with ? Pna.  Started on broad spectrum due to recent abx use.  Will discuss with hospitalist for admission.   CRITICAL CARE Performed by: Rae Roam   Total critical care time: 35 minutes  Critical care time was exclusive of separately billable procedures and treating other patients.  Critical care was necessary to treat or prevent imminent or life-threatening deterioration.  Critical care was time spent personally by me on the following activities: development of treatment plan with patient and/or surrogate as well as nursing, discussions with consultants, evaluation of patient's response to treatment, examination of patient, obtaining history from patient or surrogate, ordering and performing treatments and interventions, ordering and review of laboratory studies, ordering and review of radiographic studies, pulse oximetry and re-evaluation of patient's  condition.  The patients results and plan were reviewed and discussed.   Any x-rays performed were independently reviewed by myself.   Differential diagnosis were considered with the presenting HPI.  Medications  sodium chloride 0.9 % bolus 1,000 mL (not administered)  vancomycin (VANCOCIN) 1,500 mg in sodium chloride 0.9 % 500 mL IVPB (1,500 mg Intravenous New Bag/Given 12/14/16 1357)    ceFEPIme (MAXIPIME) 1 g in dextrose 5 % 50 mL IVPB (not administered)  sodium chloride 0.9 % bolus 1,000 mL (1,000 mLs Intravenous New Bag/Given 12/14/16 1237)    Vitals:   12/14/16 1345 12/14/16 1400 12/14/16 1415 12/14/16 1430  BP: 114/82 127/71 115/79 (!) 110/58  Pulse:    99  Resp: 19 17 20 15   Temp:   (!) 97 F (36.1 C)   TempSrc:   Rectal   SpO2:   98% 100%  Weight:      Height:        Final diagnoses:  Transient alteration of awareness  Sepsis, due to unspecified organism (HCC)  Pneumonia of right lower lobe due to infectious organism (HCC)  AKI (acute kidney injury) (HCC)    Admission/ observation were discussed with the admitting physician, patient and/or family and they are comfortable with the plan.   Final Clinical Impressions(s) / ED Diagnoses   Final diagnoses:  Transient alteration of awareness  Sepsis, due to unspecified organism [facility])  Pneumonia of right lower lobe due to infectious organism [facility])  AKI (acute kidney injury) [facility])    New Prescriptions New Prescriptions   No medications on file     Melene Plan, DO 12/14/16 1514

## 2016-12-14 NOTE — ED Triage Notes (Signed)
Pt from [place] assisted living with c/o found unresponsive for 45 mins. Upon arrival of EMS pt was responsive to verbal stimuli but had absent radial pulses. Pt became AA confused after being given 400 ml of NSS by EMS. Pt currently being treated for UTI with antibiotics. Per EMS pt is normally confused but unsure of to what extent.

## 2016-12-14 NOTE — Progress Notes (Addendum)
Pharmacy Antibiotic Note  Kathy Peterson is a 83 y.o. female admitted on 12/14/2016 with sepsis.  Pharmacy has been consulted for vancomycin/cefepime dosing (PCN allergy, has tolerated keflex in the past). -cefepime 1gm given in ED ( ~ 5pm) -vancomycin 1500mg  given in ED (~ 2pm) -SCr= 1.5 (baseline ~ 0.6-0.8), CrCl ~ 25, WBC= 11.3, afebrile  Plan: -Vancomycin 750mg  IV q24hr -Cefepime 1gm IV q24hr -Will follow renal function, cultures and clinical progress   Height: 5\' 6"  (167.6 cm) Weight: 157 lb 4.8 oz (71.4 kg) (bed scale) IBW/kg (Calculated) : 59.3  Temp (24hrs), Avg:98 F (36.7 C), Min:97 F (36.1 C), Max:99.2 F (37.3 C)   Recent Labs Lab 12/14/16 1227 12/14/16 1243 12/14/16 1550  WBC 11.3*  --   --   CREATININE 1.71* 1.50*  --   LATICACIDVEN  --  3.04* 3.29*    Estimated Creatinine Clearance: 26.2 mL/min (A) (by C-G formula based on SCr of 1.5 mg/dL (H)).    Allergies  Allergen Reactions  . Bactrim [Sulfamethoxazole-Trimethoprim] Other (See Comments)    Unknown reaction per MAR   . Penicillins Other (See Comments)    Unknown reaction per [facility]  Has patient had a PCN reaction causing immediate rash, facial/tongue/throat swelling, SOB or lightheadedness with hypotension: Unsure Has patient had a PCN reaction causing severe rash involving mucus membranes or skin necrosis: Unsure Has patient had a PCN reaction that required hospitalization Unsure Has patient had a PCN reaction occurring within the last 10 years: Unsure If all of the above answers are "NO", then may proceed with Cephalosporin use.  . Pneumovax [Pneumococcal Polysaccharide Vaccine] Other (See Comments)    Unknown reaction per MAR   . Statins Other (See Comments)    Unknown reaction per MAR   . Sulfa Antibiotics Other (See Comments)    Unknown reaction per MAR     Antimicrobials this admission: 7/29 vanc 7/29 cefepime  Dose adjustments this admission:   Microbiology results: 7/29 blood x2 7/29  urine  Thank you for allowing pharmacy to be a part of this patient's care.  8/29, Pharm D 12/14/2016 6:52 PM

## 2016-12-14 NOTE — H&P (Signed)
Date: 12/14/2016               Patient Name:  Kathy Peterson MRN: [id number]  DOB: 24-Jan-1927 Age / Sex: 83 y.o., female   PCP: [name], MD         Medical Service: Internal Medicine Teaching Service         Attending Physician: Dr. Gust Rung, DO    First Contact: Dr. Alinda Money Pager: [phone]  Second Contact: Dr. Dimple Casey Pager: [phone]       After Hours (After 5p/  First Contact Pager: [phone]  weekends / holidays): Second Contact Pager: [phone]   Chief Complaint: Altered Mental Status  History of Present Illness:  Kathy Peterson is a 83 yo F with a history of Dementia, Atrial Fibrillation, CHF, COPD, Essential HTN, Hypothyroidism, Major depressive disorder who presented to the ED from her facility after she was found to be unresponsive for 45 minutes by staff. When EMS arrived, she was responsive to verbal stimuli, but did not have palpable radial pulses. She was given 400 mL NS and was awake, alert and confused following this infusion. Patient is a poor history and supplemental history was obtained from chart review. She was recently treated for a UTI initially with Macrobid, which was switched to Keflex based upon cultures and sensitivities.  In the ED, she had a Neg Troponin, elevated WBC, Lactic Acid of 3.04 -> 3.29, equivocal U/A. Patient admitted for further workup and treatment.  Meds:  Current Meds  Medication Sig  . acetaminophen (TYLENOL) 500 MG tablet Take 500 mg by mouth 2 (two) times daily.   Marland Kitchen albuterol (PROVENTIL HFA;VENTOLIN HFA) 108 (90 Base) MCG/ACT inhaler Inhale 2 puffs into the lungs every 6 (six) hours as needed for wheezing or shortness of breath.  . ARIPiprazole (ABILIFY) 2 MG tablet Take 2 mg by mouth daily with breakfast.   . Calcium Carb-Cholecalciferol (CALCIUM 600+D) 600-800 MG-UNIT TABS Take 1 tablet by mouth daily with breakfast.   . Carboxymethylcellul-Glycerin (OPTIVE) 0.5-0.9 % SOLN Place 1 drop into both eyes every 12 (twelve) hours.  .  clonazePAM (KLONOPIN) 0.25 MG disintegrating tablet Take 0.25 mg by mouth daily with breakfast.  . diltiazem (CARDIZEM) 60 MG tablet Take 60 mg by mouth 2 (two) times daily.  Marland Kitchen docusate sodium (COLACE) 100 MG capsule Take 1 capsule (100 mg total) by mouth 2 (two) times daily. (Patient taking differently: Take 100 mg by mouth every Monday, Wednesday, and Friday. )  . DULoxetine (CYMBALTA) 60 MG capsule Take 60 mg by mouth daily with breakfast.   . feeding supplement, ENSURE ENLIVE, (ENSURE ENLIVE) LIQD Take 237 mLs by mouth 2 (two) times daily between meals.  . fluticasone-salmeterol (ADVAIR HFA) 115-21 MCG/ACT inhaler Inhale 2 puffs into the lungs 2 (two) times daily.  Marland Kitchen ipratropium (ATROVENT) 0.02 % nebulizer solution Take 0.5 mg by nebulization every 6 (six) hours as needed for wheezing or shortness of breath.  . levothyroxine (SYNTHROID, LEVOTHROID) 88 MCG tablet Take 88 mcg by mouth at bedtime.  Marland Kitchen loratadine (CLARITIN) 10 MG tablet Take 10 mg by mouth daily with breakfast.   . losartan (COZAAR) 25 MG tablet Take 25 mg by mouth daily with breakfast.   . Melatonin 3 MG TABS Take 3 mg by mouth at bedtime.  . Menthol, Topical Analgesic, 5 % GEL Apply 1 application topically See admin instructions. Apply to left side and back 3 times daily. Apply to left chest as needed for chest pain  .  pantoprazole (PROTONIX) 20 MG tablet Take 20 mg by mouth daily with breakfast.   . psyllium (REGULOID) 0.52 g capsule Take 0.52 g by mouth at bedtime.  . sodium chloride 1 g tablet Take 1 g by mouth daily.  . traMADol (ULTRAM) 50 MG tablet Take 0.5 tablets (25 mg total) by mouth every 8 (eight) hours as needed (pain). Give 1/2 to 1 tablet by mouth every 8 hours as needed for pain that is uncontrolled by tylenol. (Patient taking differently: Take 25 mg by mouth every 8 (eight) hours as needed for moderate pain. )  . traZODone (DESYREL) 50 MG tablet Take 25 mg by mouth every 8 (eight) hours as needed for sleep.   .  [DISCONTINUED] cephALEXin (KEFLEX) 250 MG capsule Take 250 mg by mouth 2 (two) times daily.     Allergies: Allergies as of 12/14/2016 - Review Complete 12/14/2016  Allergen Reaction Noted  . Bactrim [sulfamethoxazole-trimethoprim] Other (See Comments) 06/18/2016  . Penicillins Other (See Comments) 12/23/2015  . Pneumovax [pneumococcal polysaccharide vaccine] Other (See Comments) 12/23/2015  . Statins Other (See Comments) 12/23/2015  . Sulfa antibiotics Other (See Comments) 12/23/2015   Past Medical History:  Diagnosis Date  . Arthritis    rheumatoid  . CHF (congestive heart failure) (HCC)   . COPD (chronic obstructive pulmonary disease) (HCC)   . Depression   . Diabetes mellitus without complication (HCC)    type 2  . Failure to thrive in adult   . Frequent falls   . GERD (gastroesophageal reflux disease)   . Hypertension   . Hyponatremia   . Orthostatic hypotension   . Pacemaker   . PAF (paroxysmal atrial fibrillation) (HCC)    a. ? mentioned in notes, no further details available.  . Pneumonia   . Polio   . Secondhand smoke exposure   . Shortness of breath   . Thyroid disease    hypothyroid    Family History:  Family History  Problem Relation Age of Onset  . Heart disease Mother   . Stroke Neg Hx   . Diabetes Neg Hx   . Cancer Neg Hx      Social History:  Social History   Social History  . Marital status: Widowed    Spouse name: N/A  . Number of children: N/A  . Years of education: N/A   Occupational History  . retired    Social History Main Topics  . Smoking status: Never Smoker  . Smokeless tobacco: Never Used  . Alcohol use No  . Drug use: No  . Sexual activity: No   Other Topics Concern  . None   Social History Narrative  . None     Review of Systems: A complete ROS was negative except as per HPI.   Physical Exam: Blood pressure 133/86, pulse (!) 122, temperature 99 F (37.2 C), temperature source Oral, resp. rate 20, height 5\' 6"   (1.676 m), weight 157 lb 4.8 oz (71.4 kg), SpO2 98 %. Physical Exam  Constitutional: She appears well-developed and well-nourished.  HENT:  Head: Normocephalic and atraumatic.  Eyes: EOM are normal. Right eye exhibits no discharge. Left eye exhibits no discharge.  Cardiovascular: Normal rate, regular rhythm and normal heart sounds.  Exam reveals no gallop and no friction rub.   No murmur heard. Pulmonary/Chest: Effort normal. She has wheezes.  Diffuse end expiratory wheezes  Abdominal: Soft. Bowel sounds are normal. She exhibits no distension.  Suprapubic Tenderness  Musculoskeletal: She exhibits no edema, tenderness or  deformity.  Neurological: She is alert.  Oriented to Person  Skin: Skin is warm and dry.    EKG:  Wandering atrial pacemaker Ventricular premature complex Biatrial enlargement LVH with IVCD, LAD and secondary repol abnrm Prolonged QT interval Probable RV involvement, suggest recording right precordial leads Baseline wander in lead(s) II III aVF with QRS widening  CXR: IMPRESSION: Heterogeneous opacities left lung base may represent atelectasis or infection.  Assessment & Plan by Problem:  Altered Mental Status: In the setting of elevated WBC, lung opacities on CXR, recent treatment with UTI with persistent suprapubic tenderness and urinary frequency. Lactic Acid of 3.04 -> 3.29. Possible sepsis. With PCN allergy. - Urine Cultures: Pending - Blood Culture x2: Pending - Vancomycin 750mg  Daily  & Cefepime 1g Daily - CBC and BMP in AM - Lactic Acid q3h (most recent 1.7)  Atrial Fibrillation: - Diltiazem 60 BID - Cardiac Monitoring  COPD: - Duoneb q6h - Dulera 200-5mg  2 puff BID  Hypothyroidism - Levothyroxine Daily   Mood Disorder: - Aripiprazole 2mg  Daily - Duloxetine 60mg  Daily - clonazepam 0.25mg  Daily - Melatonin 3mg  Daily  F/E/N: Regular Diet, NS @ 125 mL/hr  DVT Prophylaxis: Lovenox 30mg  Code Status: DNR  Dispo: Admit patient to  Inpatient with expected length of stay greater than 2 midnights.  Signed: , DO IM PGY-1 Pager: [phone]

## 2016-12-15 DIAGNOSIS — Z79899 Other long term (current) drug therapy: Secondary | ICD-10-CM

## 2016-12-15 DIAGNOSIS — R404 Transient alteration of awareness: Secondary | ICD-10-CM

## 2016-12-15 LAB — BLOOD CULTURE ID PANEL (REFLEXED)
ACINETOBACTER BAUMANNII: NOT DETECTED
CANDIDA KRUSEI: NOT DETECTED
CANDIDA PARAPSILOSIS: NOT DETECTED
CANDIDA TROPICALIS: NOT DETECTED
Candida albicans: NOT DETECTED
Candida glabrata: NOT DETECTED
ENTEROCOCCUS SPECIES: NOT DETECTED
ESCHERICHIA COLI: NOT DETECTED
Enterobacter cloacae complex: NOT DETECTED
Enterobacteriaceae species: NOT DETECTED
HAEMOPHILUS INFLUENZAE: NOT DETECTED
KLEBSIELLA OXYTOCA: NOT DETECTED
KLEBSIELLA PNEUMONIAE: NOT DETECTED
Listeria monocytogenes: NOT DETECTED
METHICILLIN RESISTANCE: NOT DETECTED
Neisseria meningitidis: NOT DETECTED
Proteus species: NOT DETECTED
Pseudomonas aeruginosa: NOT DETECTED
SERRATIA MARCESCENS: NOT DETECTED
STAPHYLOCOCCUS AUREUS BCID: NOT DETECTED
STREPTOCOCCUS PYOGENES: NOT DETECTED
Staphylococcus species: DETECTED — AB
Streptococcus agalactiae: NOT DETECTED
Streptococcus pneumoniae: NOT DETECTED
Streptococcus species: NOT DETECTED

## 2016-12-15 LAB — BASIC METABOLIC PANEL
Anion gap: 6 (ref 5–15)
BUN: 17 mg/dL (ref 6–20)
CALCIUM: 8.5 mg/dL — AB (ref 8.9–10.3)
CO2: 21 mmol/L — AB (ref 22–32)
CREATININE: 0.73 mg/dL (ref 0.44–1.00)
Chloride: 106 mmol/L (ref 101–111)
GFR calc non Af Amer: >60 mL/min (ref >60)
GLUCOSE: 81 mg/dL (ref 65–99)
Potassium: 3.8 mmol/L (ref 3.5–5.1)
Sodium: 133 mmol/L — ABNORMAL LOW (ref 135–145)

## 2016-12-15 LAB — CBC
HCT: 32.4 % — ABNORMAL LOW (ref 36.0–46.0)
Hemoglobin: 10.9 g/dL — ABNORMAL LOW (ref 12.0–15.0)
MCH: 28.3 pg (ref 26.0–34.0)
MCHC: 33.6 g/dL (ref 30.0–36.0)
MCV: 84.2 fL (ref 78.0–100.0)
PLATELETS: 182 10*3/uL (ref 150–400)
RBC: 3.85 MIL/uL — ABNORMAL LOW (ref 3.87–5.11)
RDW: 13.1 % (ref 11.5–15.5)
WBC: 5.8 10*3/uL (ref 4.0–10.5)

## 2016-12-15 LAB — URINE CULTURE: Culture: NO GROWTH

## 2016-12-15 MED ORDER — ENOXAPARIN SODIUM 40 MG/0.4ML ~~LOC~~ SOLN
40.0000 mg | SUBCUTANEOUS | Status: DC
  Administered 2016-12-15 – 2016-12-16 (×2): 40 mg via SUBCUTANEOUS
  Filled 2016-12-15 (×2): qty 0.4

## 2016-12-15 NOTE — Progress Notes (Signed)
Patient ID: Kathy Peterson, female   DOB: [birth year], 83 y.o.   MRN: [id number]   Subjective: Kathy Peterson remains pleasantly demented this morning. She denies chest pain and shortness of breath. She endorses wheezing and suprapubic pain.   Objective:  Vital signs in last 24 hours: Vitals:   12/15/16 1016 12/15/16 1100 12/15/16 1148 12/15/16 1246  BP:  (!) 159/74  138/79  Pulse:    95  Resp:    18  Temp:    98.7 F (37.1 C)  TempSrc:    Oral  SpO2: 96%  96% 96%  Weight:      Height:       Physical Exam  Constitutional: She appears well-developed and well-nourished.  HENT:  Head: Normocephalic and atraumatic.  Eyes: EOM are normal. Right eye exhibits no discharge. Left eye exhibits no discharge.  Cardiovascular: Normal rate, regular rhythm and normal heart sounds.  Exam reveals no gallop and no friction rub.   No murmur heard. Pulmonary/Chest: Effort normal. She has wheezes. Pacemaker  Bilateral expiratory wheezes  Abdominal: Soft. Bowel sounds are normal. She exhibits no distension.  Suprapubic Tenderness  Musculoskeletal: She exhibits no edema, tenderness or deformity.  Neurological: She is alert.  Oriented to Person and City Skin: Skin is warm and dry.   Assessment/Plan:  Altered Mental Status: In the setting of elevated WBC, lung opacities on CXR, recent treatment with UTI with persistent suprapubic tenderness and urinary frequency. Lactic Acid of 3.04 -> 3.29 -> 1.7 -> 1.1. Possible sepsis. PCN allergy. Recently on Macrobid followed by keflex based on culture. Will obtain MAR from facility to ensure medications were given. Nasal MRSA swab negative, Vancomycin discontinued. - CBC (7/30) shows WBC 5.8 down from 11.8 (7/29) - Urine Cultures: Pending - Blood Culture x2: Pending - Cefepime 1g Daily  Atrial Fibrillation: - Diltiazem 60 BID - Cardiac Monitoring  COPD: - Duoneb q6h - Dulera 200-5mg  2 puff BID  Hypothyroidism - Levothyroxine Daily   Mood Disorder: -  Aripiprazole 2mg  Daily held due to long QT - Duloxetine 60mg  Daily - clonazepam 0.25mg  Daily - Melatonin 3mg  Daily  F/E/N: Regular Diet  DVT Prophylaxis: Lovenox 30mg  Code Status: DNR  Dispo: Anticipated discharge in approximately 1 - 2 day(s).   , DO IM PGY-1 Pager: [phone]

## 2016-12-15 NOTE — Progress Notes (Addendum)
Pt is manipulating the purewick in a manner which it is not intended, therefore changing placement and cancelling the benefits it is intended to provide. Accurate I&O will be complicated for this patient, however she is picking at tubes, so foley would be discouraged as well.

## 2016-12-15 NOTE — Progress Notes (Signed)
Pt states that she is not hungry, agreeable to ensure shake only.

## 2016-12-15 NOTE — Progress Notes (Signed)
Nutrition Brief Note  Patient identified on the Malnutrition Screening Tool (MST) Report  Wt Readings from Last 15 Encounters:  12/15/16 159 lb (72.1 kg)  04/12/16 164 lb 3.9 oz (74.5 kg)  01/25/16 156 lb (70.8 kg)  01/16/16 156 lb 12.8 oz (71.1 kg)  01/01/16 156 lb 12.8 oz (71.1 kg)  12/28/15 152 lb (68.9 kg)  12/23/15 152 lb (68.9 kg)   No major changes in weight over the past year per weight encounters.   Body mass index is 25.66 kg/m.   Current diet order is Regular, patient is consuming approximately 100% of meals at this time. Pt does require feeding assistance per RN. Diet supplement with Ensure Enlive as well. Labs and medications reviewed.   Nutrition-Focused physical exam completed. Findings are no fat depletion, mild muscle depletion, and no edema.   Pt does not meet clinical characteristics for malnutrition at this time  No nutrition interventions warranted at this time. If nutrition issues arise, please consult RD.   Romelle Starcher MS, RD, LDN [phone] Pager  [phone] Weekend/On-Call Pager

## 2016-12-15 NOTE — Progress Notes (Signed)
   12/15/16 1424  Precautions  Precautions Fall risk  Adult Fall Risk Assessment  Risk Factor Category (scoring not indicated) Not Applicable  Age 83  Fall History: Fall within 6 months prior to admission 0  Elimination; Bowel and/or Urine Incontinence 2  Elimination; Bowel and/or Urine Urgency/Frequency 2  Medications: includes PCA/Opiates, Anti-convulsants, Anti-hypertensives, Diuretics, Hypnotics, Laxatives, Sedatives, and Psychotropics 5  Patient Care Equipment 1  Mobility-Assistance 2  Mobility-Gait 2  Mobility-Sensory Deficit 0  Altered awareness of immediate physical environment 1  Impulsiveness 0  Lack of understanding of one's physical/cognitive limitations 4  Total Score 22  Patient's Fall Risk High Fall Risk (>13 points)  Adult Fall Risk Interventions  Required Bundle Interventions *See Row Information* High fall risk - low, moderate, and high requirements implemented  Additional Interventions PT/OT need assessed if change in mobility from baseline

## 2016-12-15 NOTE — Clinical Social Work Note (Signed)
Clinical Social Work Assessment  Patient Details  Name: Kathy Peterson MRN: [id number] Date of Birth: 02/19/1927  Date of referral:  12/15/16               Reason for consult:  Discharge Planning                Permission sought to share information with:  Facility Medical sales representative, Family Supports Permission granted to share information::  Yes, Verbal Permission Granted  Name::     Dorene Sorrow and Corrie Mckusick  Agency::  [facility]  Relationship::  Son and daughter-in-law  Contact Information:  Dorene Sorrow: [phone]: [phone]  Housing/Transportation Living arrangements for the past 2 months:  Assisted Living Facility Source of Information:  Medical Team, Adult Children, Facility Patient Interpreter Needed:  None Criminal Activity/Legal Involvement Pertinent to Current Situation/Hospitalization:  No - Comment as needed Significant Relationships:  Adult Children Lives with:  Facility Resident Do you feel safe going back to the place where you live?  Yes Need for family participation in patient care:  Yes (Comment)  Care giving concerns:  Patient is a resident at [facility].   Social Worker assessment / plan:  Patient oriented to self only. No supports at bedside. CSW attempted calling patient's son. No answer/voicemail set up. CSW called patient's daughter-in-law. She confirmed that patient is a resident at [facility] and the plan is for her to return once stable for discharge. Patient will need PTAR. CSW will keep ALF updated on discharge date. Her contacts at the facility are Darius Banker) and [place] at [phone]. No further concerns. CSW encouraged patient's daughter-in-law to contact CSW as needed. CSW will continue to follow patient and her family for support and facilitate discharge back to ALF once medically stable.  Employment status:  Retired Database administrator PT Recommendations:  Not assessed at this  time Information / Referral to community resources:  Other (Comment Required) (Plan is to return to ALF)  Patient/Family's Response to care:  Patient oriented to self only. Patient's daughter-in-law agreeable to return to ALF. Patient's family supportive and involved in patient's care. Patient's daughter-in-law appreciated social work intervention.  Patient/Family's Understanding of and Emotional Response to Diagnosis, Current Treatment, and Prognosis:  Patient oriented to self only. Patient's daughter-in-law has a good understanding of the reason for admission and her need to return to ALF. Patient's daughter-in-law appears happy with hospital care.  Emotional Assessment Appearance:  Appears stated age Attitude/Demeanor/Rapport:  Unable to Assess Affect (typically observed):  Unable to Assess Orientation:  Oriented to Self Alcohol / Substance use:  Never Used Psych involvement (Current and /or in the community):  No (Comment)  Discharge Needs  Concerns to be addressed:  Care Coordination Readmission within the last 30 days:  No Current discharge risk:  Cognitively Impaired Barriers to Discharge:  Continued Medical Work up   Margarito Liner, LCSW 12/15/2016, 12:00 PM

## 2016-12-16 ENCOUNTER — Other Ambulatory Visit (HOSPITAL_COMMUNITY): Payer: Self-pay

## 2016-12-16 LAB — CBC
HEMATOCRIT: 32.1 % — AB (ref 36.0–46.0)
HEMOGLOBIN: 11.1 g/dL — AB (ref 12.0–15.0)
MCH: 28.7 pg (ref 26.0–34.0)
MCHC: 34.6 g/dL (ref 30.0–36.0)
MCV: 82.9 fL (ref 78.0–100.0)
Platelets: 207 10*3/uL (ref 150–400)
RBC: 3.87 MIL/uL (ref 3.87–5.11)
RDW: 12.6 % (ref 11.5–15.5)
WBC: 7.5 10*3/uL (ref 4.0–10.5)

## 2016-12-16 MED ORDER — IPRATROPIUM-ALBUTEROL 0.5-2.5 (3) MG/3ML IN SOLN: 3.0000 mL | Freq: Four times a day (QID) | RESPIRATORY_TRACT | Status: DC | PRN

## 2016-12-16 MED ORDER — IPRATROPIUM-ALBUTEROL 0.5-2.5 (3) MG/3ML IN SOLN
3.0000 mL | Freq: Three times a day (TID) | RESPIRATORY_TRACT | Status: DC
  Administered 2016-12-16: 3 mL via RESPIRATORY_TRACT
  Filled 2016-12-16: qty 3

## 2016-12-16 MED ORDER — IPRATROPIUM-ALBUTEROL 0.5-2.5 (3) MG/3ML IN SOLN: 3.0000 mL | Freq: Two times a day (BID) | RESPIRATORY_TRACT | Status: DC

## 2016-12-16 MED ORDER — CEPHALEXIN 500 MG PO CAPS
500.0000 mg | ORAL_CAPSULE | Freq: Two times a day (BID) | ORAL | Status: DC
  Administered 2016-12-16: 500 mg via ORAL
  Filled 2016-12-16: qty 1

## 2016-12-16 MED ORDER — IPRATROPIUM-ALBUTEROL 0.5-2.5 (3) MG/3ML IN SOLN
3.0000 mL | Freq: Two times a day (BID) | RESPIRATORY_TRACT | Status: DC
  Administered 2016-12-16 – 2016-12-17 (×2): 3 mL via RESPIRATORY_TRACT
  Filled 2016-12-16 (×2): qty 3

## 2016-12-16 NOTE — Progress Notes (Signed)
Internal Medicine Attending:   I saw and examined the patient. I reviewed the resident's note and I agree with the resident's findings and plan as documented in the resident's note. Kathy Peterson remains pleasantly demented still oriented only to person.  Overall she has remained stable.  We now have her urine culture result which is negative.  Her Blood Culture result at present time is 1of 2 positive for Coag neg staphlococcus which is likely a contaminate.  Repeat cultures were drawn this morning.  We have obtained her MAR from SNF and she was taking keflex 250mg  BID for the 5 days preceeding her presentation. Overall in review she presented hypotensive with AMS, we suspected sepsis and she rapidly improved with the sepsis protocol.  Given her Culture results are negative and appropriate Abx pre-hospital I suspect she may have just been dehydrated which would explain her AKI, AMS, hypotension, and hyponatremia.  This responded well to IVF.  At this point I would d/c Cefepime and she is stable for discharge back to her SNF.

## 2016-12-16 NOTE — Discharge Summary (Signed)
Name: Kathy Peterson MRN: [id number] DOB: 10-11-1926 83 y.o. PCP: [name], MD  Date of Admission: 12/14/2016 12:03 PM Date of Discharge: 12/17/2016  Attending Physician: Doneen Poisson, MD   Discharge Diagnosis:   Active Problems:   COPD (chronic obstructive pulmonary disease) (HCC)   Altered mental status   Discharge Medications: Allergies as of 12/17/2016      Reactions   Bactrim [sulfamethoxazole-trimethoprim] Other (See Comments)   Unknown reaction per MAR    Penicillins Other (See Comments)   Unknown reaction per [facility]  Has patient had a PCN reaction causing immediate rash, facial/tongue/throat swelling, SOB or lightheadedness with hypotension: Unsure Has patient had a PCN reaction causing severe rash involving mucus membranes or skin necrosis: Unsure Has patient had a PCN reaction that required hospitalization Unsure Has patient had a PCN reaction occurring within the last 10 years: Unsure If all of the above answers are "NO", then may proceed with Cephalosporin use.   Pneumovax [pneumococcal Polysaccharide Vaccine] Other (See Comments)   Unknown reaction per MAR    Statins Other (See Comments)   Unknown reaction per MAR    Sulfa Antibiotics Other (See Comments)   Unknown reaction per [facility]       Medication List    TAKE these medications   acetaminophen 500 MG tablet Commonly known as:  TYLENOL Take 500 mg by mouth 2 (two) times daily.   ADVAIR HFA 115-21 MCG/ACT inhaler Generic drug:  fluticasone-salmeterol Inhale 2 puffs into the lungs 2 (two) times daily.   albuterol 108 (90 Base) MCG/ACT inhaler Commonly known as:  PROVENTIL HFA;VENTOLIN HFA Inhale 2 puffs into the lungs every 6 (six) hours as needed for wheezing or shortness of breath.   ARIPiprazole 2 MG tablet Commonly known as:  ABILIFY Take 2 mg by mouth daily with breakfast.   CALCIUM 600+D 600-800 MG-UNIT Tabs Generic drug:  Calcium Carb-Cholecalciferol Take 1 tablet by mouth daily with  breakfast.   clonazePAM 0.25 MG disintegrating tablet Commonly known as:  KLONOPIN Take 0.25 mg by mouth daily with breakfast.   diltiazem 60 MG tablet Commonly known as:  CARDIZEM Take 60 mg by mouth 2 (two) times daily.   docusate sodium 100 MG capsule Commonly known as:  COLACE Take 1 capsule (100 mg total) by mouth every Monday, Wednesday, and Friday.   DULoxetine 60 MG capsule Commonly known as:  CYMBALTA Take 60 mg by mouth daily with breakfast.   feeding supplement (ENSURE ENLIVE) Liqd Take 237 mLs by mouth 2 (two) times daily between meals.   ipratropium 0.02 % nebulizer solution Commonly known as:  ATROVENT Take 0.5 mg by nebulization every 6 (six) hours as needed for wheezing or shortness of breath.   levothyroxine 88 MCG tablet Commonly known as:  SYNTHROID, LEVOTHROID Take 88 mcg by mouth at bedtime.   loratadine 10 MG tablet Commonly known as:  CLARITIN Take 10 mg by mouth daily with breakfast.   losartan 25 MG tablet Commonly known as:  COZAAR Take 25 mg by mouth daily with breakfast.   Melatonin 3 MG Tabs Take 3 mg by mouth at bedtime.   Menthol (Topical Analgesic) 5 % Gel Apply 1 application topically See admin instructions. Apply to left side and back 3 times daily. Apply to left chest as needed for chest pain   OPTIVE 0.5-0.9 % Soln Generic drug:  Carboxymethylcellul-Glycerin Place 1 drop into both eyes every 12 (twelve) hours.   pantoprazole 20 MG tablet Commonly known as:  PROTONIX Take 20  mg by mouth daily with breakfast.   psyllium 0.52 g capsule Commonly known as:  REGULOID Take 0.52 g by mouth at bedtime.   sodium chloride 1 g tablet Take 1 g by mouth daily.   traMADol 50 MG tablet Commonly known as:  ULTRAM Take 0.5 tablets (25 mg total) by mouth every 8 (eight) hours as needed (pain). Give 1/2 to 1 tablet by mouth every 8 hours as needed for pain that is uncontrolled by tylenol. What changed:  reasons to take this  additional  instructions   traZODone 50 MG tablet Commonly known as:  DESYREL Take 25 mg by mouth every 8 (eight) hours as needed for sleep.       Disposition and follow-up:   Kathy Peterson was discharged from [facility] in Stable condition.  At the hospital follow up visit please address:  1.  Altered Mental Status: Please monitor for recurrent symptoms of infection or dehydration. As it was possible this episode was primarily secondary to hypovolemia, please ensure adequate hydration.  2.  Labs / imaging needed at time of follow-up: None  3.  Pending labs/ test needing follow-up: None  Follow-up Appointments: Follow-up Information    [name], MD. Schedule an appointment as soon as possible for a visit in 2 week(s).   Specialty:  Family Medicine Contact information: [address] [place] [address] [phone]           Hospital Course by problem list:  1. Altered Mental Status: Patient was admitted after being brought in by EMS from her SNF where she was reported to have been unresponsive. She was found to be responsive to verbal stimuli by EMS. She was given a fluid bolus and taken to the ED. In the ED she was found to be more awake and alert, but still hypotensive. She was found to have an elevated WBC, lactic acid >3, UA showing possible infection (see below), and BMET showing elevated Creatinine. Chest Xray showed "Heterogeneous opacities left lung base may represent atelectasis or infection."  Chart review reveled she had been seen in the ED on 7/21 with complaints of abdominal pain and dysuria, UA suggestive of cystitis; she was prescribed Macrobid and discharged back to her facility. The urine culture from 7/21 grew Proteus mirabilis resistant to Macrobid, but susceptible to cephalexin. Cephalexin was prescribed and review of MAR (later in admission) verified that she had been receiving it.  She was initially placed on Vancomycin and Cefepime  to cover for possible urinary and (less likely) respiratory sources of sepsis in the setting of a PCN allergy. She was also placed on IVF. She responded well to initially therapy. Lactic acid and WBC down trended. Nasal swab was negative for MRSA and Vancomycin was discontinued. Urine cultures showed no growth. A blood culture grew coagulase negative staph (likely contamination with staph epidermidis.) After review of MAR to confiem completion of 8 days total of appropriate antibiotic therapy (including facility administered keflex) antibiotics were discontinued. It is possible her Altered mental status may have been due to dehydration more so than infection as she had AKI on presentation, recieved IVF as part of her therapy, and had already been on appropriate antibiotic course.  2. Atrial Fibrillation; COPD; Hypothyroidism; Mood Disorder: Home medications were continued. Abilify was held at the suggestion of pharmacy due to prolonged QT. However, this was likely not necessary as she has a pacemaker in place.  3. Chronic Congestive Heart Failure: Her volume status  was monitored.  Discharge Vitals:   BP (!) 153/84 (BP Location: Left Arm)   Pulse 86   Temp 98.7 F (37.1 C) (Oral)   Resp 18   Ht 5\' 6"  (1.676 m)   Wt 157 lb (71.2 kg)   LMP  (LMP Unknown)   SpO2 95%   BMI 25.34 kg/m   Pertinent Labs, Studies, and Procedures:  CBC Latest Ref Rng & Units 12/16/2016 12/15/2016 12/14/2016  WBC 4.0 - 10.5 K/uL 7.5 5.8 -  Hemoglobin 12.0 - 15.0 g/dL 11.1(L) 10.9(L) 14.3  Hematocrit 36.0 - 46.0 % 32.1(L) 32.4(L) 42.0  Platelets 150 - 400 K/uL 207 182 -   BMP Latest Ref Rng & Units 12/15/2016 12/14/2016 12/14/2016  Glucose 65 - 99 mg/dL 81 810(F) 751(W)  BUN 6 - 20 mg/dL 17 25(E) 52(D)  Creatinine 0.44 - 1.00 mg/dL 7.82 4.23(N) 3.61(W)  Sodium 135 - 145 mmol/L 133(L) 130(L) 127(L)  Potassium 3.5 - 5.1 mmol/L 3.8 4.4 4.5  Chloride 101 - 111 mmol/L 106 91(L) 93(L)  CO2 22 - 32 mmol/L 21(L) - 24    Calcium 8.9 - 10.3 mg/dL 4.3(X) - 9.2   Lactic Acid: 3.04, 3.29, 1.7, 1.1  Urinalysis:  Ref Range & Units (12/14/16)  Color, Urine YELLOW YELLOW   APPearance CLEAR HAZY    Specific Gravity, Urine 1.005 - 1.030 1.019   pH 5.0 - 8.0 5.0   Glucose, UA NEGATIVE mg/dL NEGATIVE   Hgb urine dipstick NEGATIVE NEGATIVE   Bilirubin Urine NEGATIVE NEGATIVE   Ketones, ur NEGATIVE mg/dL NEGATIVE   Protein, ur NEGATIVE mg/dL NEGATIVE   Nitrite NEGATIVE NEGATIVE   Leukocytes, UA NEGATIVE MODERATE    RBC / HPF 0 - 5 RBC/hpf 0-5   WBC, UA 0 - 5 WBC/hpf 6-30   Bacteria, UA NONE SEEN RARE    Squamous Epithelial / LPF NONE SEEN 0-5     Chest Xray: IMPRESSION: Heterogeneous opacities left lung base may represent atelectasis or infection.  Urine Cultures: No growth (Final)  Blood Cultures: STAPHYLOCOCCUS SPECIES (COAGULASE NEGATIVE). THE SIGNIFICANCE OF ISOLATING THIS ORGANISM FROM A SINGLE SET OF BLOOD CULTURES WHEN MULTIPLE SETS ARE DRAWN IS UNCERTAIN.   Discharge Instructions: Discharge Instructions    Call MD for:  difficulty breathing, headache or visual disturbances    Complete by:  As directed    Call MD for:  extreme fatigue    Complete by:  As directed    Call MD for:  persistant nausea and vomiting    Complete by:  As directed    Call MD for:  redness, tenderness, or signs of infection (pain, swelling, redness, odor or green/yellow discharge around incision site)    Complete by:  As directed    Call MD for:  severe uncontrolled pain    Complete by:  As directed    Call MD for:  temperature >100.4    Complete by:  As directed    Diet - low sodium heart healthy    Complete by:  As directed    Discharge instructions    Complete by:  As directed    Please take all medications as prescribed. Make sure you are drinking and eating throughout the day as able.   Increase activity slowly    Complete by:  As directed       Signed:  Scherrie Gerlach, MD Internal Medicine, PGY-1

## 2016-12-16 NOTE — Progress Notes (Signed)
PHARMACY - PHYSICIAN COMMUNICATION CRITICAL VALUE ALERT - BLOOD CULTURE IDENTIFICATION (BCID)  Results for orders placed or performed during the hospital encounter of 12/14/16  Blood Culture ID Panel (Reflexed) (Collected: 12/14/2016 12:33 PM)  Result Value Ref Range   Enterococcus species NOT DETECTED NOT DETECTED   Listeria monocytogenes NOT DETECTED NOT DETECTED   Staphylococcus species DETECTED (A) NOT DETECTED   Staphylococcus aureus NOT DETECTED NOT DETECTED   Methicillin resistance NOT DETECTED NOT DETECTED   Streptococcus species NOT DETECTED NOT DETECTED   Streptococcus agalactiae NOT DETECTED NOT DETECTED   Streptococcus pneumoniae NOT DETECTED NOT DETECTED   Streptococcus pyogenes NOT DETECTED NOT DETECTED   Acinetobacter baumannii NOT DETECTED NOT DETECTED   Enterobacteriaceae species NOT DETECTED NOT DETECTED   Enterobacter cloacae complex NOT DETECTED NOT DETECTED   Escherichia coli NOT DETECTED NOT DETECTED   Klebsiella oxytoca NOT DETECTED NOT DETECTED   Klebsiella pneumoniae NOT DETECTED NOT DETECTED   Proteus species NOT DETECTED NOT DETECTED   Serratia marcescens NOT DETECTED NOT DETECTED   Haemophilus influenzae NOT DETECTED NOT DETECTED   Neisseria meningitidis NOT DETECTED NOT DETECTED   Pseudomonas aeruginosa NOT DETECTED NOT DETECTED   Candida albicans NOT DETECTED NOT DETECTED   Candida glabrata NOT DETECTED NOT DETECTED   Candida krusei NOT DETECTED NOT DETECTED   Candida parapsilosis NOT DETECTED NOT DETECTED   Candida tropicalis NOT DETECTED NOT DETECTED    Name of physician (or Provider) Contacted: Dr. Lovenia Kim ([facility])  Changes to prescribed antibiotics required: No changes needed  Abran Duke 12/16/2016  12:10 AM

## 2016-12-16 NOTE — Progress Notes (Addendum)
Patient ID: Kathy Peterson, female   DOB: 1926/12/21, 83 y.o.   MRN: [id number]   Subjective: Kathy Peterson remains pleasantly demented this morning. Padded mitt hand restraints in place due to line pulling overnight. She denies chest pain and shortness of breath. She endorses wheezing. She states her suprapubic pain is improving.   Objective:  Vital signs in last 24 hours: Vitals:   12/15/16 2011 12/15/16 2100 12/16/16 0618 12/16/16 1333  BP:  (!) 161/68 100/76 (!) 149/78  Pulse:  100 80 93  Resp:    18  Temp:  98.6 F (37 C) 98.5 F (36.9 C) 98.4 F (36.9 C)  TempSrc:  Oral Oral Oral  SpO2: 98% 100% 98% 96%  Weight:   155 lb (70.3 kg)   Height:       Physical Exam  Constitutional: She appears well-developed and well-nourished.  HENT:  Head: Normocephalic and atraumatic.  Eyes: EOM are normal. Right eye exhibits no discharge. Left eye exhibits no discharge.  Cardiovascular: Normal rate, regular rhythm and normal heart sounds.  Exam reveals no gallop and no friction rub.   No murmur heard. Pulmonary/Chest: Effort normal. She has wheezes. Pacemaker  Bilateral expiratory wheezes  Abdominal: Soft. Bowel sounds are normal. She exhibits no distension.  Suprapubic Tenderness deep palpation Musculoskeletal: She exhibits no edema, tenderness or deformity.  Neurological: She is alert.  Oriented to Person only Skin: Skin is warm and dry.   Assessment/Plan:  Altered Mental Status: In the setting of elevated WBC, lung opacities on CXR, recent treatment with UTI with persistent suprapubic tenderness and urinary frequency. Lactic Acid of 3.04 -> 3.29 -> 1.7 -> 1.1. Possible sepsis. PCN allergy. Recently on Macrobid followed by keflex based on culture. MAR from facility obtained, medications were given. Nasal MRSA swab negative, Vancomycin discontinued. Blood culture grew Coag negative Staph, suspected contamination. Blood culture repeated. 8 days total of adequate antibiotic treatment including  facility medications, Antibiotics discontinued.  - AMS possibly secondary to dehydration considering adequate antibiotic coverage and AKI on admission. - WBC:  11.8 (7/29); 5.8 (7/30); 7.5 (7/31) - Urine Cultures: No growth - Repeat Blood Culture x2: Pending - Antibiotic course complete  Atrial Fibrillation: Pacemaker in place - Diltiazem 60 BID - Discontinue Cardiac Monitoring  COPD: - Duoneb q6h - Dulera 200-5mg  2 puff BID  Hypothyroidism - Levothyroxine Daily   Chronic Congestive Heart Failure: Clinically undetermined if systolic or diastolic. - Monitor volume status  Mood Disorder: - Aripiprazole 2mg  Daily held due to long QT - Duloxetine 60mg  Daily - clonazepam 0.25mg  Daily - Melatonin 3mg  Daily  F/E/N: Regular Diet  DVT Prophylaxis: Lovenox 30mg  Code Status: DNR  Dispo: Anticipated discharge in approximately 0 - 1 day(s).   , DO [facility] PGY-1 Pager: [phone]

## 2016-12-17 DIAGNOSIS — Z950 Presence of cardiac pacemaker: Secondary | ICD-10-CM

## 2016-12-17 DIAGNOSIS — Z881 Allergy status to other antibiotic agents status: Secondary | ICD-10-CM

## 2016-12-17 DIAGNOSIS — Z880 Allergy status to penicillin: Secondary | ICD-10-CM

## 2016-12-17 DIAGNOSIS — F05 Delirium due to known physiological condition: Secondary | ICD-10-CM

## 2016-12-17 DIAGNOSIS — Z888 Allergy status to other drugs, medicaments and biological substances status: Secondary | ICD-10-CM

## 2016-12-17 DIAGNOSIS — I4891 Unspecified atrial fibrillation: Secondary | ICD-10-CM

## 2016-12-17 DIAGNOSIS — E860 Dehydration: Secondary | ICD-10-CM

## 2016-12-17 DIAGNOSIS — G9341 Metabolic encephalopathy: Secondary | ICD-10-CM

## 2016-12-17 DIAGNOSIS — I4581 Long QT syndrome: Secondary | ICD-10-CM

## 2016-12-17 DIAGNOSIS — Z887 Allergy status to serum and vaccine status: Secondary | ICD-10-CM

## 2016-12-17 DIAGNOSIS — J449 Chronic obstructive pulmonary disease, unspecified: Secondary | ICD-10-CM

## 2016-12-17 DIAGNOSIS — I509 Heart failure, unspecified: Secondary | ICD-10-CM

## 2016-12-17 DIAGNOSIS — N390 Urinary tract infection, site not specified: Secondary | ICD-10-CM

## 2016-12-17 DIAGNOSIS — E039 Hypothyroidism, unspecified: Secondary | ICD-10-CM

## 2016-12-17 DIAGNOSIS — Z882 Allergy status to sulfonamides status: Secondary | ICD-10-CM

## 2016-12-17 DIAGNOSIS — F0391 Unspecified dementia with behavioral disturbance: Secondary | ICD-10-CM

## 2016-12-17 LAB — CULTURE, BLOOD (ROUTINE X 2)

## 2016-12-17 MED ORDER — DOCUSATE SODIUM 100 MG PO CAPS: 100.0000 mg | ORAL_CAPSULE | ORAL | 0 refills | Status: AC

## 2016-12-17 NOTE — Clinical Social Work Note (Signed)
CSW facilitated patient discharge including contacting patient family and facility to confirm patient discharge plans. Clinical information faxed to facility and family agreeable with plan. CSW arranged ambulance transport via [facility] to [place]. RN to call report prior to discharge ([phone]).  CSW will sign off for now as social work intervention is no longer needed. Please consult us again if new needs arise.  Trista Ciocca, CSW [phone]  

## 2016-12-17 NOTE — Progress Notes (Signed)
CCMD notified RN patient had a 12 beat run of vtach. Patient assessed. Asymptomatic. Reports no pain at this time. MD notified. Will continue to monitor.

## 2016-12-17 NOTE — Clinical Social Work Note (Signed)
CSW faxed FL2 and discharge summary to facility. Will fax again once cosigned by attending MD.   Charlynn Court, CSW [phone]

## 2016-12-17 NOTE — Progress Notes (Signed)
Patient ID: Kathy Peterson, female   DOB: Mar 25, 1927, 83 y.o.   MRN: [id number]   Subjective:  Doing well this morning. 12 beats of vtach noted last night; patient was asymptomatic. Denies chest pain or shortness of breath. Patient has recovered well, and plan is to discharge back to SNF today.  Objective:  Vital signs in last 24 hours: Vitals:   12/17/16 0033 12/17/16 0500 12/17/16 0905 12/17/16 1250  BP: (!) 164/84 134/76  (!) 153/84  Pulse: 91 79  86  Resp: 18 20  18   Temp:  98.7 F (37.1 C)  98.7 F (37.1 C)  TempSrc:  Oral  Oral  SpO2: 96% 97% 97% 95%  Weight:  157 lb (71.2 kg)    Height:       Physical Exam  Constitutional: Elderly female lying comfortably in bed HENT: [place]/AT; PERRL CV: NR & RR; no m/r/g Pulmonary/Chest: CTAB  Abdominal: Soft. Soft, non-tender, non-distended. Normal BS Musculoskeletal: No edema or tenderness Neurological: Alert, follows commands Skin: warm and dry  Assessment/Plan:  AMS, secondary to dehydration, resolved - Blood culture: 1/2 bottles of coagulase negative staph, likely contaminant - Urine culture neg - Antibiotic course complete  Atrial Fibrillation with pacemaker - Diltiazem 60 BID  COPD: - Duoneb q6h - Dulera 200-5mg  2 puff BID  Hypothyroidism - Levothyroxine Daily  Chronic Congestive Heart Failure: Clinically undetermined if systolic or diastolic. - Monitor volume status  Mood Disorder: - Aripiprazole 2mg  Daily held due to long QT - Duloxetine 60mg  Daily - clonazepam 0.25mg  Daily - Melatonin 3mg  Daily  F/E/N: Regular Diet  DVT Prophylaxis: Lovenox 30mg  Code Status: DNR  Dispo: to SNF today  , MD Internal Medicine, PGY-1 P [phone]

## 2016-12-17 NOTE — [place] FL2 (Signed)
[place] MEDICAID FL2 LEVEL OF CARE SCREENING TOOL     IDENTIFICATION  Patient Name: Kathy Peterson Birthdate: Sep 18, 1926 Sex: female Admission Date (Current Location): 12/14/2016  [facility] and [place] Number:  Producer, television/film/video and Address:  [facility], [address] [place], [place] [address]      Provider Number: [id number]  Attending Physician Name and Address:  Doneen Poisson, MD  Relative Name and Phone Number:       Current Level of Care: Hospital Recommended Level of Care: Assisted Living Facility Prior Approval Number:    Date Approved/Denied:   PASRR Number:    Discharge Plan: Other (Comment) (ALF)    Current Diagnoses: Patient Active Problem List   Diagnosis Date Noted  . Altered mental status 12/14/2016  . MDD (major depressive disorder), recurrent severe, without psychosis (HCC) 04/08/2016  . COPD exacerbation (HCC) 04/08/2016  . History of pacemaker 04/08/2016  . QT prolongation 04/08/2016  . Episode of recurrent major depressive disorder (HCC)   . FTT (failure to thrive) in adult   . Hyponatremia 12/24/2015  . Rib fractures 12/23/2015  . Atrial fibrillation (HCC) 12/23/2015  . CHF (congestive heart failure) (HCC) 12/23/2015  . COPD (chronic obstructive pulmonary disease) (HCC) 12/23/2015  . Pre-diabetes 12/23/2015  . Essential (primary) hypertension 12/23/2015  . Hypothyroidism 12/23/2015  . Recurrent falls 12/23/2015    Orientation RESPIRATION BLADDER Height & Weight     Self  Normal Incontinent Weight: 157 lb (71.2 kg) Height:  5\' 6"  (167.6 cm)  BEHAVIORAL SYMPTOMS/MOOD NEUROLOGICAL BOWEL NUTRITION STATUS   (None)  (None) Continent Diet ([facility])  AMBULATORY STATUS COMMUNICATION OF NEEDS Skin     Verbally Bruising                       Personal Care Assistance Level of Assistance              Functional Limitations Info  Sight, Hearing, Speech Sight Info: Adequate Hearing Info: Adequate Speech Info:  Adequate    SPECIAL CARE FACTORS FREQUENCY  Blood pressure                    Contractures Contractures Info: Not present    Additional Factors Info  Code Status, Allergies, Psychotropic Code Status Info: DNR Allergies Info: Bactrim (Sulfamethoxazole-trimethoprim), Penicillins, Pneumovax Pneumococcal Polysaccharide Vaccine, Statins, Sulfa Antibiotics Psychotropic Info: MDD (major depressive disorder), recurrent severe, without psychosis: Klonopin disintegrating tablet 0.25 mg PO daily with breakfast, Cymbalta DR capsule 60 mg PO daily with breakfast,          Current Medications (12/17/2016):  This is the current hospital active medication list Current Facility-Administered Medications  Medication Dose Route Frequency Provider Last Rate Last Dose  . acetaminophen (TYLENOL) tablet 500 mg  500 mg Oral BID 02/16/2017, MD   500 mg at 12/17/16 1054  . clonazePAM (KLONOPIN) disintegrating tablet 0.25 mg  0.25 mg Oral Q breakfast 02/16/17, MD   0.25 mg at 12/17/16 1054  . diltiazem (CARDIZEM) tablet 60 mg  60 mg Oral BID 02/16/17, MD   60 mg at 12/17/16 1054  . docusate sodium (COLACE) capsule 100 mg  100 mg Oral Q M,W,F 02/16/17, MD   100 mg at 12/17/16 1053  . DULoxetine (CYMBALTA) DR capsule 60 mg  60 mg Oral Q breakfast 02/16/17, MD   60 mg at 12/17/16 1053  . enoxaparin (LOVENOX) injection 40 mg  40 mg Subcutaneous Q24H Gust Rung, DO   40 mg at 12/16/16 2307  . feeding supplement (ENSURE ENLIVE) (ENSURE ENLIVE) liquid 237 mL  237 mL Oral BID BM Rice, Jamesetta Orleans, MD   237 mL at 12/17/16 1055  . ipratropium-albuterol (DUONEB) 0.5-2.5 (3) MG/3ML nebulizer solution 3 mL  3 mL Nebulization Q6H PRN Carlynn Purl C, DO      . ipratropium-albuterol (DUONEB) 0.5-2.5 (3) MG/3ML nebulizer solution 3 mL  3 mL Nebulization BID Carlynn Purl C, DO   3 mL at 12/17/16 0905  . levothyroxine (SYNTHROID, LEVOTHROID) tablet 88 mcg  88 mcg  Oral QHS Fuller Plan, MD   88 mcg at 12/16/16 2306  . loratadine (CLARITIN) tablet 10 mg  10 mg Oral Q breakfast Fuller Plan, MD   10 mg at 12/17/16 1054  . Melatonin TABS 3 mg  3 mg Oral QHS Rice, Jamesetta Orleans, MD   3 mg at 12/16/16 2308  . mometasone-formoterol (DULERA) 200-5 MCG/ACT inhaler 2 puff  2 puff Inhalation BID Fuller Plan, MD   2 puff at 12/17/16 0905  . MUSCLE RUB CREA 1 application  1 application Topical TID Fuller Plan, MD   1 application at 12/17/16 1054  . polyvinyl alcohol (LIQUIFILM TEARS) 1.4 % ophthalmic solution 1 drop  1 drop Both Eyes Q12H Rice, Jamesetta Orleans, MD   1 drop at 12/17/16 1054  . psyllium (HYDROCIL/METAMUCIL) packet 1 packet  1 packet Oral QHS Fuller Plan, MD   1 packet at 12/16/16 2306  . sodium chloride tablet 1 g  1 g Oral Daily Fuller Plan, MD   1 g at 12/17/16 1057     Discharge Medications: TAKE these medications   acetaminophen 500 MG tablet Commonly known as:  TYLENOL Take 500 mg by mouth 2 (two) times daily.   ADVAIR HFA 115-21 MCG/ACT inhaler Generic drug:  fluticasone-salmeterol Inhale 2 puffs into the lungs 2 (two) times daily.   albuterol 108 (90 Base) MCG/ACT inhaler Commonly known as:  PROVENTIL HFA;VENTOLIN HFA Inhale 2 puffs into the lungs every 6 (six) hours as needed for wheezing or shortness of breath.   ARIPiprazole 2 MG tablet Commonly known as:  ABILIFY Take 2 mg by mouth daily with breakfast.   CALCIUM 600+D 600-800 MG-UNIT Tabs Generic drug:  Calcium Carb-Cholecalciferol Take 1 tablet by mouth daily with breakfast.   clonazePAM 0.25 MG disintegrating tablet Commonly known as:  KLONOPIN Take 0.25 mg by mouth daily with breakfast.   diltiazem 60 MG tablet Commonly known as:  CARDIZEM Take 60 mg by mouth 2 (two) times daily.   docusate sodium 100 MG capsule Commonly known as:  COLACE Take 1 capsule (100 mg total) by mouth every Monday, Wednesday, and Friday.    DULoxetine 60 MG capsule Commonly known as:  CYMBALTA Take 60 mg by mouth daily with breakfast.   feeding supplement (ENSURE ENLIVE) Liqd Take 237 mLs by mouth 2 (two) times daily between meals.   ipratropium 0.02 % nebulizer solution Commonly known as:  ATROVENT Take 0.5 mg by nebulization every 6 (six) hours as needed for wheezing or shortness of breath.   levothyroxine 88 MCG tablet Commonly known as:  SYNTHROID, LEVOTHROID Take 88 mcg by mouth at bedtime.   loratadine 10 MG tablet Commonly known as:  CLARITIN Take 10 mg by mouth daily with breakfast.   losartan 25 MG tablet Commonly known as:  COZAAR Take 25 mg by mouth daily with breakfast.  Melatonin 3 MG Tabs Take 3 mg by mouth at bedtime.   Menthol (Topical Analgesic) 5 % Gel Apply 1 application topically See admin instructions. Apply to left side and back 3 times daily. Apply to left chest as needed for chest pain   OPTIVE 0.5-0.9 % Soln Generic drug:  Carboxymethylcellul-Glycerin Place 1 drop into both eyes every 12 (twelve) hours.   pantoprazole 20 MG tablet Commonly known as:  PROTONIX Take 20 mg by mouth daily with breakfast.   psyllium 0.52 g capsule Commonly known as:  REGULOID Take 0.52 g by mouth at bedtime.   sodium chloride 1 g tablet Take 1 g by mouth daily.   traMADol 50 MG tablet Commonly known as:  ULTRAM Take 0.5 tablets (25 mg total) by mouth every 8 (eight) hours as needed (pain). Give 1/2 to 1 tablet by mouth every 8 hours as needed for pain that is uncontrolled by tylenol. What changed:  reasons to take this  additional instructions   traZODone 50 MG tablet Commonly known as:  DESYREL Take 25 mg by mouth every 8 (eight) hours as needed for sleep.     Relevant Imaging Results:  Relevant Lab Results:   Additional Information SS#: [id number]  Margarito Liner, LCSW

## 2016-12-17 NOTE — Progress Notes (Signed)
Patient has order to discharge to [facility]. IV and telemetry removed. Report given to [facility]. Pt stable and PTAR at bedside for transportation.

## 2016-12-17 NOTE — Progress Notes (Signed)
Attempted report to [facility]; phone message left. Will re-attempt.

## 2016-12-17 NOTE — Clinical Social Work Note (Signed)
Layla Barter, RN at [facility] aware of plan for discharge today.  Charlynn Court, CSW [phone]

## 2016-12-17 NOTE — Progress Notes (Signed)
Internal Medicine Attending  Date: 12/17/2016  Patient name: Kathy Peterson Medical record number: [id number] Date of birth: 07/29/1926 Age: 83 y.o. Gender: female  I saw and evaluated the patient.   S: She was without complaints today.   O: On exam she responded to questions appropriately. There was no abdominal pain.  A/P: Her acute metabolic encephalopathy has resolved with IV fluid resuscitation. Her urinary tract infection has been appropriately treated. She is stable for transfer back to her skilled nursing facility.

## 2016-12-19 LAB — CULTURE, BLOOD (ROUTINE X 2)
Culture: NO GROWTH
Special Requests: ADEQUATE

## 2016-12-21 LAB — CULTURE, BLOOD (ROUTINE X 2)
CULTURE: NO GROWTH
CULTURE: NO GROWTH
Special Requests: ADEQUATE
Special Requests: ADEQUATE

## 2017-01-13 ENCOUNTER — Encounter (HOSPITAL_COMMUNITY): Payer: Self-pay

## 2017-01-13 ENCOUNTER — Emergency Department (HOSPITAL_COMMUNITY): Payer: Medicare HMO

## 2017-01-13 ENCOUNTER — Emergency Department (HOSPITAL_COMMUNITY)
Admission: EM | Admit: 2017-01-13 | Discharge: 2017-01-13 | Disposition: A | Discharge status: Home / Self Care | Payer: Medicare HMO | Attending: Emergency Medicine | Admitting: Emergency Medicine

## 2017-01-13 DIAGNOSIS — Z955 Presence of coronary angioplasty implant and graft: Secondary | ICD-10-CM | POA: Diagnosis not present

## 2017-01-13 DIAGNOSIS — E119 Type 2 diabetes mellitus without complications: Secondary | ICD-10-CM | POA: Insufficient documentation

## 2017-01-13 DIAGNOSIS — S0083XA Contusion of other part of head, initial encounter: Secondary | ICD-10-CM | POA: Diagnosis not present

## 2017-01-13 DIAGNOSIS — W19XXXA Unspecified fall, initial encounter: Secondary | ICD-10-CM

## 2017-01-13 DIAGNOSIS — E039 Hypothyroidism, unspecified: Secondary | ICD-10-CM | POA: Insufficient documentation

## 2017-01-13 DIAGNOSIS — I480 Paroxysmal atrial fibrillation: Secondary | ICD-10-CM | POA: Insufficient documentation

## 2017-01-13 DIAGNOSIS — Z79899 Other long term (current) drug therapy: Secondary | ICD-10-CM | POA: Insufficient documentation

## 2017-01-13 DIAGNOSIS — S0990XA Unspecified injury of head, initial encounter: Secondary | ICD-10-CM | POA: Diagnosis present

## 2017-01-13 DIAGNOSIS — Y92129 Unspecified place in nursing home as the place of occurrence of the external cause: Secondary | ICD-10-CM | POA: Diagnosis not present

## 2017-01-13 DIAGNOSIS — I509 Heart failure, unspecified: Secondary | ICD-10-CM | POA: Diagnosis not present

## 2017-01-13 DIAGNOSIS — Y9389 Activity, other specified: Secondary | ICD-10-CM | POA: Insufficient documentation

## 2017-01-13 DIAGNOSIS — J449 Chronic obstructive pulmonary disease, unspecified: Secondary | ICD-10-CM | POA: Insufficient documentation

## 2017-01-13 DIAGNOSIS — W1839XA Other fall on same level, initial encounter: Secondary | ICD-10-CM | POA: Diagnosis not present

## 2017-01-13 DIAGNOSIS — I110 Hypertensive heart disease with heart failure: Secondary | ICD-10-CM | POA: Insufficient documentation

## 2017-01-13 DIAGNOSIS — Z950 Presence of cardiac pacemaker: Secondary | ICD-10-CM | POA: Diagnosis not present

## 2017-01-13 DIAGNOSIS — Y999 Unspecified external cause status: Secondary | ICD-10-CM | POA: Diagnosis not present

## 2017-01-13 LAB — URINALYSIS, MICROSCOPIC (REFLEX)
Bacteria, UA: NONE SEEN
RBC / HPF: NONE SEEN RBC/hpf (ref 0–5)
Squamous Epithelial / HPF: NONE SEEN

## 2017-01-13 LAB — CBC WITH DIFFERENTIAL/PLATELET
Basophils Absolute: 0 10*3/uL (ref 0.0–0.1)
Basophils Relative: 0 %
Eosinophils Absolute: 0.1 10*3/uL (ref 0.0–0.7)
Eosinophils Relative: 1 %
HCT: 35.8 % — ABNORMAL LOW (ref 36.0–46.0)
Hemoglobin: 12.5 g/dL (ref 12.0–15.0)
Lymphocytes Relative: 15 %
Lymphs Abs: 1.4 10*3/uL (ref 0.7–4.0)
MCH: 28.9 pg (ref 26.0–34.0)
MCHC: 34.9 g/dL (ref 30.0–36.0)
MCV: 82.9 fL (ref 78.0–100.0)
Monocytes Absolute: 0.6 10*3/uL (ref 0.1–1.0)
Monocytes Relative: 6 %
Neutro Abs: 7.2 10*3/uL (ref 1.7–7.7)
Neutrophils Relative %: 78 %
Platelets: 232 10*3/uL (ref 150–400)
RBC: 4.32 MIL/uL (ref 3.87–5.11)
RDW: 12.6 % (ref 11.5–15.5)
WBC: 9.4 10*3/uL (ref 4.0–10.5)

## 2017-01-13 LAB — BASIC METABOLIC PANEL
Anion gap: 8 (ref 5–15)
BUN: 14 mg/dL (ref 6–20)
CO2: 28 mmol/L (ref 22–32)
Calcium: 9.1 mg/dL (ref 8.9–10.3)
Chloride: 95 mmol/L — ABNORMAL LOW (ref 101–111)
Creatinine, Ser: 0.69 mg/dL (ref 0.44–1.00)
GFR calc Af Amer: >60 mL/min (ref >60)
GFR calc non Af Amer: >60 mL/min (ref >60)
Glucose, Bld: 122 mg/dL — ABNORMAL HIGH (ref 65–99)
Potassium: 3.9 mmol/L (ref 3.5–5.1)
Sodium: 131 mmol/L — ABNORMAL LOW (ref 135–145)

## 2017-01-13 LAB — URINALYSIS, ROUTINE W REFLEX MICROSCOPIC
Bilirubin Urine: NEGATIVE
Glucose, UA: NEGATIVE mg/dL
Ketones, ur: NEGATIVE mg/dL
Nitrite: NEGATIVE
Protein, ur: NEGATIVE mg/dL
Specific Gravity, Urine: 1.01 (ref 1.005–1.030)
pH: 8 (ref 5.0–8.0)

## 2017-01-13 MED ORDER — MORPHINE SULFATE (PF) 4 MG/ML IV SOLN
4.0000 mg | Freq: Once | INTRAVENOUS | Status: AC
  Administered 2017-01-13: 4 mg via INTRAVENOUS
  Filled 2017-01-13: qty 1

## 2017-01-13 NOTE — ED Triage Notes (Signed)
Pt had an unwitnessed fall this am at her facility Pt states she stood up and got dizzy and then fell to the ground hitting her head Pt has a hematoma above her left eye and she complains of neck and back pain

## 2017-01-13 NOTE — ED Notes (Signed)
Bed: WG95 Expected date:  Expected time:  Means of arrival:  Comments: 89 yr fall, head injury, loc

## 2017-01-13 NOTE — ED Notes (Signed)
Patient transported to CT 

## 2017-01-20 ENCOUNTER — Emergency Department (HOSPITAL_COMMUNITY)
Admission: EM | Admit: 2017-01-20 | Discharge: 2017-01-20 | Disposition: A | Discharge status: Home / Self Care | Payer: Medicare HMO | Attending: Emergency Medicine | Admitting: Emergency Medicine

## 2017-01-20 ENCOUNTER — Emergency Department (HOSPITAL_COMMUNITY): Payer: Medicare HMO

## 2017-01-20 ENCOUNTER — Encounter (HOSPITAL_COMMUNITY): Payer: Self-pay | Admitting: Emergency Medicine

## 2017-01-20 DIAGNOSIS — I509 Heart failure, unspecified: Secondary | ICD-10-CM | POA: Diagnosis not present

## 2017-01-20 DIAGNOSIS — J449 Chronic obstructive pulmonary disease, unspecified: Secondary | ICD-10-CM | POA: Diagnosis not present

## 2017-01-20 DIAGNOSIS — Z79899 Other long term (current) drug therapy: Secondary | ICD-10-CM | POA: Diagnosis not present

## 2017-01-20 DIAGNOSIS — E119 Type 2 diabetes mellitus without complications: Secondary | ICD-10-CM | POA: Diagnosis not present

## 2017-01-20 DIAGNOSIS — Y92122 Bedroom in nursing home as the place of occurrence of the external cause: Secondary | ICD-10-CM | POA: Insufficient documentation

## 2017-01-20 DIAGNOSIS — N390 Urinary tract infection, site not specified: Secondary | ICD-10-CM | POA: Insufficient documentation

## 2017-01-20 DIAGNOSIS — W06XXXA Fall from bed, initial encounter: Secondary | ICD-10-CM | POA: Insufficient documentation

## 2017-01-20 DIAGNOSIS — S0121XA Laceration without foreign body of nose, initial encounter: Secondary | ICD-10-CM | POA: Diagnosis not present

## 2017-01-20 DIAGNOSIS — Z9049 Acquired absence of other specified parts of digestive tract: Secondary | ICD-10-CM | POA: Diagnosis not present

## 2017-01-20 DIAGNOSIS — E039 Hypothyroidism, unspecified: Secondary | ICD-10-CM | POA: Insufficient documentation

## 2017-01-20 DIAGNOSIS — Z66 Do not resuscitate: Secondary | ICD-10-CM | POA: Insufficient documentation

## 2017-01-20 DIAGNOSIS — Z950 Presence of cardiac pacemaker: Secondary | ICD-10-CM | POA: Diagnosis not present

## 2017-01-20 DIAGNOSIS — Y998 Other external cause status: Secondary | ICD-10-CM | POA: Insufficient documentation

## 2017-01-20 DIAGNOSIS — R51 Headache: Secondary | ICD-10-CM | POA: Insufficient documentation

## 2017-01-20 DIAGNOSIS — Z23 Encounter for immunization: Secondary | ICD-10-CM | POA: Diagnosis not present

## 2017-01-20 DIAGNOSIS — F329 Major depressive disorder, single episode, unspecified: Secondary | ICD-10-CM | POA: Insufficient documentation

## 2017-01-20 DIAGNOSIS — W19XXXA Unspecified fall, initial encounter: Secondary | ICD-10-CM

## 2017-01-20 DIAGNOSIS — Y9389 Activity, other specified: Secondary | ICD-10-CM | POA: Insufficient documentation

## 2017-01-20 LAB — BASIC METABOLIC PANEL
ANION GAP: 6 (ref 5–15)
BUN: 20 mg/dL (ref 6–20)
CHLORIDE: 99 mmol/L — AB (ref 101–111)
CO2: 26 mmol/L (ref 22–32)
Calcium: 8.9 mg/dL (ref 8.9–10.3)
Creatinine, Ser: 0.79 mg/dL (ref 0.44–1.00)
GFR calc Af Amer: >60 mL/min (ref >60)
Glucose, Bld: 108 mg/dL — ABNORMAL HIGH (ref 65–99)
POTASSIUM: 4.6 mmol/L (ref 3.5–5.1)
SODIUM: 131 mmol/L — AB (ref 135–145)

## 2017-01-20 LAB — CBC WITH DIFFERENTIAL/PLATELET
BASOS ABS: 0 10*3/uL (ref 0.0–0.1)
Basophils Relative: 1 %
Eosinophils Absolute: 0.2 10*3/uL (ref 0.0–0.7)
Eosinophils Relative: 3 %
HEMATOCRIT: 34 % — AB (ref 36.0–46.0)
HEMOGLOBIN: 11.4 g/dL — AB (ref 12.0–15.0)
LYMPHS ABS: 1.3 10*3/uL (ref 0.7–4.0)
LYMPHS PCT: 22 %
MCH: 28.8 pg (ref 26.0–34.0)
MCHC: 33.5 g/dL (ref 30.0–36.0)
MCV: 85.9 fL (ref 78.0–100.0)
Monocytes Absolute: 0.6 10*3/uL (ref 0.1–1.0)
Monocytes Relative: 10 %
NEUTROS ABS: 3.9 10*3/uL (ref 1.7–7.7)
NEUTROS PCT: 64 %
PLATELETS: 223 10*3/uL (ref 150–400)
RBC: 3.96 MIL/uL (ref 3.87–5.11)
RDW: 12.7 % (ref 11.5–15.5)
WBC: 6.1 10*3/uL (ref 4.0–10.5)

## 2017-01-20 LAB — URINALYSIS, ROUTINE W REFLEX MICROSCOPIC
BILIRUBIN URINE: NEGATIVE
GLUCOSE, UA: NEGATIVE mg/dL
KETONES UR: NEGATIVE mg/dL
NITRITE: NEGATIVE
PH: 7 (ref 5.0–8.0)
Protein, ur: 100 mg/dL — AB
Specific Gravity, Urine: 1.016 (ref 1.005–1.030)

## 2017-01-20 LAB — CK: Total CK: 42 U/L (ref 38–234)

## 2017-01-20 MED ORDER — FENTANYL CITRATE (PF) 100 MCG/2ML IJ SOLN
12.5000 ug | Freq: Once | INTRAMUSCULAR | Status: AC
  Administered 2017-01-20: 12.5 ug via INTRAVENOUS

## 2017-01-20 MED ORDER — FENTANYL CITRATE (PF) 100 MCG/2ML IJ SOLN
100.0000 ug | Freq: Once | INTRAMUSCULAR | Status: DC
  Filled 2017-01-20: qty 2

## 2017-01-20 MED ORDER — CEPHALEXIN 500 MG PO CAPS: 500.0000 mg | ORAL_CAPSULE | Freq: Three times a day (TID) | ORAL | 0 refills | Status: DC

## 2017-01-20 MED ORDER — TETANUS-DIPHTH-ACELL PERTUSSIS 5-2.5-18.5 LF-MCG/0.5 IM SUSP
0.5000 mL | Freq: Once | INTRAMUSCULAR | Status: AC
  Administered 2017-01-20: 0.5 mL via INTRAMUSCULAR
  Filled 2017-01-20: qty 0.5

## 2017-01-20 MED ORDER — CEPHALEXIN 250 MG PO CAPS
500.0000 mg | ORAL_CAPSULE | Freq: Once | ORAL | Status: AC
  Administered 2017-01-20: 500 mg via ORAL
  Filled 2017-01-20: qty 2

## 2017-01-20 NOTE — ED Notes (Signed)
Report given to nurse at [facility]. No questions or concerns

## 2017-01-20 NOTE — ED Notes (Signed)
Patient transported to X-ray 

## 2017-01-20 NOTE — ED Notes (Signed)
Pt reports pain improved

## 2017-01-20 NOTE — ED Notes (Signed)
Patient transported to CT 

## 2017-01-20 NOTE — Discharge Instructions (Signed)
No serious injuries as result fall. Kathy Peterson has a urinary tract infection. Urine has been sent for culture. We will call if antibiotic needs to be changed. Administer Keflex as prescribed,. Tdap was updated today

## 2017-01-20 NOTE — ED Notes (Signed)
Old bruising noted to Left orbital area; new abrasions noted.

## 2017-01-20 NOTE — ED Notes (Signed)
All fall risk precautions in place.

## 2017-01-20 NOTE — ED Triage Notes (Signed)
Pt found down about 0800 by staff lying prone next to bed; hx of recent fall with old bruising to left eye. Has new swelling to left orbital area. Complains of R knee pain; neck pain. Confused per baseline but staff say more confused. States "around heart" been hurting lately. Has pacemaker.

## 2017-01-20 NOTE — ED Notes (Signed)
Graham crackers given.

## 2017-01-20 NOTE — ED Provider Notes (Signed)
[facility]-EMERGENCY DEPT Provider Note   CSN: [id number] Arrival date & time: 01/20/17  5277     History   Chief Complaint Chief Complaint  Patient presents with  . Fall   History is obtained from Santo Held, nurse at assisted living facility via telephone HPI Kathy Peterson is a 83 y.o. female.  HPI Patient fell from bed some time last night. She was found face down on the floor at 8 AM this morning she complains of right knee pain since the fall. She was noted to have a tiny laceration on the bridge of her nose. She has ecchymosis on the left side of her face and left temporal area from a fall from a few days ago. She was seen here on 01/13/2017 where she had CT scans of her brain cervical spine and maxillofacial areas, showing no acute injury. She denies other complaint besides right knee pain. Past Medical History:  Diagnosis Date  . Arthritis    rheumatoid  . CHF (congestive heart failure) (HCC)   . COPD (chronic obstructive pulmonary disease) (HCC)   . Depression   . Diabetes mellitus without complication (HCC)    type 2  . Failure to thrive in adult   . Frequent falls   . GERD (gastroesophageal reflux disease)   . Hypertension   . Hyponatremia   . Orthostatic hypotension   . Pacemaker   . PAF (paroxysmal atrial fibrillation) (HCC)    a. ? mentioned in notes, no further details available.  . Pneumonia   . Polio   . Secondhand smoke exposure   . Shortness of breath   . Thyroid disease    hypothyroid    Patient Active Problem List   Diagnosis Date Noted  . Acute metabolic encephalopathy   . Altered mental status 12/14/2016  . MDD (major depressive disorder), recurrent severe, without psychosis (HCC) 04/08/2016  . COPD exacerbation (HCC) 04/08/2016  . History of pacemaker 04/08/2016  . QT prolongation 04/08/2016  . Episode of recurrent major depressive disorder (HCC)   . FTT (failure to thrive) in adult   . Hyponatremia 12/24/2015  . Rib fractures 12/23/2015  . Atrial  fibrillation (HCC) 12/23/2015  . CHF (congestive heart failure) (HCC) 12/23/2015  . COPD (chronic obstructive pulmonary disease) (HCC) 12/23/2015  . Pre-diabetes 12/23/2015  . Essential (primary) hypertension 12/23/2015  . Hypothyroidism 12/23/2015  . Recurrent falls 12/23/2015    Past Surgical History:  Procedure Laterality Date  . ABDOMINAL HYSTERECTOMY    . CHOLECYSTECTOMY    . EYE SURGERY      OB History    Gravida Para Term Preterm AB Living   2 2 2     2    SAB TAB Ectopic Multiple Live Births           2       Home Medications    Prior to Admission medications   Medication Sig Start Date End Date Taking? Authorizing Provider  acetaminophen (TYLENOL) 500 MG tablet Take 500 mg by mouth 2 (two) times daily.     [provider]  albuterol (PROVENTIL HFA;VENTOLIN HFA) 108 (90 Base) MCG/ACT inhaler Inhale 2 puffs into the lungs every 6 (six) hours as needed for wheezing or shortness of breath.    [provider]  ARIPiprazole (ABILIFY) 5 MG tablet Take 5 mg by mouth daily.    [provider]  Calcium Carb-Cholecalciferol (CALCIUM 600+D) 600-800 MG-UNIT TABS Take 1 tablet by mouth daily with breakfast.     [provider]  Carboxymethylcellul-Glycerin (OPTIVE) 0.5-0.9 % SOLN Place 1 drop into both eyes every 12 (twelve) hours.    [provider]  clonazePAM (KLONOPIN) 0.25 MG disintegrating tablet Take 0.25 mg by mouth daily with breakfast.    [provider]  diltiazem (CARDIZEM) 60 MG tablet Take 60 mg by mouth 2 (two) times daily.    [provider]  docusate sodium (COLACE) 100 MG capsule Take 1 capsule (100 mg total) by mouth every Monday, Wednesday, and Friday. 12/17/16   Scherrie Gerlach, MD  DULoxetine (CYMBALTA) 60 MG capsule Take 60 mg by mouth daily with breakfast.     [provider]  feeding supplement, ENSURE ENLIVE, (ENSURE ENLIVE) LIQD Take 237 mLs by mouth 2 (two) times daily between meals.  04/12/16   Albertine Grates, MD  fluticasone-salmeterol (ADVAIR HFA) [phone] MCG/ACT inhaler Inhale 2 puffs into the lungs 2 (two) times daily.    [provider]  ipratropium (ATROVENT) 0.02 % nebulizer solution Take 0.5 mg by nebulization every 6 (six) hours as needed for wheezing or shortness of breath.    [provider]  levothyroxine (SYNTHROID, LEVOTHROID) 88 MCG tablet Take 88 mcg by mouth at bedtime.    [provider]  Lidocaine 4 % PTCH Apply 1 patch topically every morning. Remove every night.    [provider]  loratadine (CLARITIN) 10 MG tablet Take 10 mg by mouth daily with breakfast.     [provider]  losartan (COZAAR) 25 MG tablet Take 25 mg by mouth daily with breakfast.     [provider]  Melatonin 3 MG TABS Take 3 mg by mouth at bedtime.    [provider]  Menthol, Topical Analgesic, (BIOFREEZE) 4 % GEL Apply 1 application topically 3 (three) times daily. Apply to right hip    [provider]  Menthol, Topical Analgesic, 5 % GEL Apply 1 application topically See admin instructions. Apply to left side and back 3 times daily. Apply to left chest as needed for chest pain    [provider]  pantoprazole (PROTONIX) 20 MG tablet Take 20 mg by mouth daily with breakfast.     [provider]  psyllium (REGULOID) 0.52 g capsule Take 0.52 g by mouth at bedtime.    [provider]  sodium chloride 1 g tablet Take 1 g by mouth daily.    [provider]  traMADol (ULTRAM) 50 MG tablet Take 0.5 tablets (25 mg total) by mouth every 8 (eight) hours as needed (pain). Give 1/2 to 1 tablet by mouth every 8 hours as needed for pain that is uncontrolled by tylenol. Patient taking differently: Take 25 mg by mouth every 8 (eight) hours as needed for moderate pain.  04/12/16   Albertine Grates, MD  traZODone (DESYREL) 50 MG tablet Take 25 mg by mouth 2 (two) times daily.     [provider]     Family History Family History  Problem Relation Age of Onset  . Heart disease Mother   . Stroke Neg Hx   . Diabetes Neg Hx   . Cancer Neg Hx     Social History Social History  Substance Use Topics  . Smoking status: Never Smoker  . Smokeless tobacco: Never Used  . Alcohol use No   Lives in assisted living facility. DO NOT RESUSCITATE CODE STATUS  Allergies   Bactrim [sulfamethoxazole-trimethoprim]; Penicillins; Pneumovax [pneumococcal polysaccharide vaccine]; Statins; and Sulfa antibiotics   Review of Systems Review of  Systems  Constitutional: Negative.   HENT: Negative.   Respiratory: Negative.   Cardiovascular: Negative.   Gastrointestinal: Negative.   Musculoskeletal: Positive for arthralgias and gait problem.       Wheelchair-bound, right knee pain  Skin: Positive for wound.        Wound the bridge of nose, ecchymosis to face  Psychiatric/Behavioral: Negative.   All other systems reviewed and are negative.    Physical Exam Updated Vital Signs BP (!) 148/84   Pulse 98   Temp 98 F (36.7 C) (Oral)   Resp (!) 22   LMP  (LMP Unknown)   SpO2 95%   Physical Exam  Constitutional: She appears well-developed and well-nourished.  HENT:  Head: Normocephalic and atraumatic.  Right temple area and right forehead with bluish yellow ecchymosis. There is a tiny approximately 2 mm laceration to the bridge of the nose. Otherwise atraumatic  Eyes: Pupils are equal, round, and reactive to light. Conjunctivae are normal.  Neck: Neck supple. No tracheal deviation present. No thyromegaly present.  nontender  Cardiovascular: Normal rate and regular rhythm.   No murmur heard. Pulmonary/Chest: Effort normal and breath sounds normal.  Abdominal: Soft. Bowel sounds are normal. She exhibits no distension. There is no tenderness.  Musculoskeletal: Normal range of motion. She exhibits no edema or tenderness.  Entire spine is nontender. Right lower extremity shortened as  compared to left. Skin is intact. She is tender at the knee. DP pulse 2+ good capillary refill. All other extremities or contusion abrasion or tenderness neurovascularly intact  Neurological: She is alert. Coordination normal.  Skin: Skin is warm and dry. No rash noted.  Psychiatric: She has a normal mood and affect.  Nursing note and vitals reviewed.    ED Treatments / Results  Labs (all labs ordered are listed, but only abnormal results are displayed) Labs Reviewed  CBC WITH DIFFERENTIAL/PLATELET - Abnormal; Notable for the following:       Result Value   Hemoglobin 11.4 (*)    HCT 34.0 (*)    All other components within normal limits  BASIC METABOLIC PANEL  CK  URINALYSIS, ROUTINE W REFLEX MICROSCOPIC    EKG  EKG Interpretation None       Radiology No results found.  Procedures Procedures (including critical care time)  Medications Ordered in ED Medications  fentaNYL (SUBLIMAZE) injection 100 mcg (not administered)  Tdap (BOOSTRIX) injection 0.5 mL (not administered)   12:30 PM patient resting comfortably after treatment with intravenous fentanyl. States she's "a little sore". Plan urine sent for culture. Prescription Keflex. I spoke with patient's son who reports that prior allergic reaction to penicillin was hives..  Tdap updated X-rays viewed by me Results for orders placed or performed during the hospital encounter of 01/20/17  Basic metabolic panel  Result Value Ref Range   Sodium 131 (L) 135 - 145 mmol/L   Potassium 4.6 3.5 - 5.1 mmol/L   Chloride 99 (L) 101 - 111 mmol/L   CO2 26 22 - 32 mmol/L   Glucose, Bld 108 (H) 65 - 99 mg/dL   BUN 20 6 - 20 mg/dL   Creatinine, Ser 9.14 0.44 - 1.00 mg/dL   Calcium 8.9 8.9 - 78.2 mg/dL   GFR calc non Af Amer >60 >60 mL/min   GFR calc Af Amer >60 >60 mL/min   Anion gap 6 5 - 15  CBC with Differential/Platelet  Result Value Ref Range   WBC 6.1 4.0 - 10.5 K/uL   RBC 3.96  3.87 - 5.11 MIL/uL   Hemoglobin 11.4 (L)  12.0 - 15.0 g/dL   HCT 16.1 (L) 09.6 - 04.5 %   MCV 85.9 78.0 - 100.0 fL   MCH 28.8 26.0 - 34.0 pg   MCHC 33.5 30.0 - 36.0 g/dL   RDW 40.9 81.1 - 91.4 %   Platelets 223 150 - 400 K/uL   Neutrophils Relative % 64 %   Neutro Abs 3.9 1.7 - 7.7 K/uL   Lymphocytes Relative 22 %   Lymphs Abs 1.3 0.7 - 4.0 K/uL   Monocytes Relative 10 %   Monocytes Absolute 0.6 0.1 - 1.0 K/uL   Eosinophils Relative 3 %   Eosinophils Absolute 0.2 0.0 - 0.7 K/uL   Basophils Relative 1 %   Basophils Absolute 0.0 0.0 - 0.1 K/uL  CK  Result Value Ref Range   Total CK 42 38 - 234 U/L  Urinalysis, Routine w reflex microscopic  Result Value Ref Range   Color, Urine YELLOW YELLOW   APPearance CLOUDY (A) CLEAR   Specific Gravity, Urine 1.016 1.005 - 1.030   pH 7.0 5.0 - 8.0   Glucose, UA NEGATIVE NEGATIVE mg/dL   Hgb urine dipstick SMALL (A) NEGATIVE   Bilirubin Urine NEGATIVE NEGATIVE   Ketones, ur NEGATIVE NEGATIVE mg/dL   Protein, ur 782 (A) NEGATIVE mg/dL   Nitrite NEGATIVE NEGATIVE   Leukocytes, UA LARGE (A) NEGATIVE   RBC / HPF 6-30 0 - 5 RBC/hpf   WBC, UA TOO NUMEROUS TO COUNT 0 - 5 WBC/hpf   Bacteria, UA MANY (A) NONE SEEN   Squamous Epithelial / LPF 0-5 (A) NONE SEEN   Non Squamous Epithelial 0-5 (A) NONE SEEN   Dg Ribs Unilateral W/chest Left  Result Date: 01/13/2017 CLINICAL DATA:  Un witnessed fall with pain. EXAM: LEFT RIBS AND CHEST - 3+ VIEW COMPARISON:  12/14/2016 FINDINGS: Left cardiac dual lead pacemaker appears stable. Heart size is within normal limits. Atherosclerotic calcifications at the aortic arch. Old fractures involving the left seventh and eighth ribs. Probable old fracture involving the left ninth and tenth ribs. No evidence for an acute left rib fracture. Negative for a pneumothorax. Lungs are clear. Evidence for a coronary artery stent. IMPRESSION: Old left rib fractures.  No evidence for an acute left rib fracture. No acute chest findings. Electronically Signed   By: Richarda Overlie  M.D.   On: 01/13/2017 08:26   Ct Head Wo Contrast  Result Date: 01/20/2017 CLINICAL DATA:  Fall today with head injury.  Headache and neck pain EXAM: CT HEAD WITHOUT CONTRAST CT CERVICAL SPINE WITHOUT CONTRAST TECHNIQUE: Multidetector CT imaging of the head and cervical spine was performed following the standard protocol without intravenous contrast. Multiplanar CT image reconstructions of the cervical spine were also generated. COMPARISON:  CT head and C-spine 01/13/2017 FINDINGS: CT HEAD FINDINGS Brain: Moderate atrophy. Chronic ischemic change in the white matter is stable. Chronic ischemia in the right cerebellum is stable. Negative for acute infarct. Negative for hemorrhage or mass Vascular: Atherosclerotic calcification. Negative for hyperdense vessel Skull: Thickening of the right frontal bone is chronic and benign, possible fibrous dysplasia. Sinuses/Orbits: Mild mucosal edema paranasal sinuses. Bilateral cataract removal. Other: Left frontal scalp hematoma CT CERVICAL SPINE FINDINGS Alignment: 3 mm anterolisthesis L3-4 unchanged. 4 mm anterolisthesis C7-T1 unchanged. Skull base and vertebrae: Negative for acute fracture Soft tissues and spinal canal: Negative Disc levels: Multilevel disc and facet degeneration. Anterolisthesis C3-4 and C7-T1 consistent with degenerative change. Upper chest: Apical scarring.  No acute abnormality Other: None IMPRESSION: Left frontal scalp hematoma.  No acute intracranial abnormality Cervical spine degenerative change as above.  No acute fracture Electronically Signed   By: Marlan Palau M.D.   On: 01/20/2017 11:13   Ct Head Wo Contrast  Result Date: 01/13/2017 CLINICAL DATA:  Fall this morning with facial pain and headaches, initial encounter EXAM: CT HEAD WITHOUT CONTRAST CT MAXILLOFACIAL WITHOUT CONTRAST CT CERVICAL SPINE WITHOUT CONTRAST TECHNIQUE: Multidetector CT imaging of the head, cervical spine, and maxillofacial structures were performed using the standard  protocol without intravenous contrast. Multiplanar CT image reconstructions of the cervical spine and maxillofacial structures were also generated. COMPARISON:  None. FINDINGS: CT HEAD FINDINGS Brain: Mild atrophic changes are noted. Chronic white matter ischemic change is seen. No findings to suggest acute hemorrhage, acute infarction or space-occupying mass lesion are noted. Vascular: No hyperdense vessel or unexpected calcification. Skull: Normal. Negative for fracture or focal lesion. Other: Mild soft tissue swelling is noted over the left forehead consistent with the recent injury. CT MAXILLOFACIAL FINDINGS Osseous: No acute bony abnormality is noted. Orbits: The globes are intact. No intra orbital abnormality is noted. Preseptal soft tissue swelling is noted consistent with the recent injury. Sinuses: Clear. Soft tissues: Soft tissue swelling is noted in the region of the left forehead and about the left orbit consistent with the recent injury. No other focal abnormality is noted. CT CERVICAL SPINE FINDINGS Alignment: Mild anterolisthesis of C3 on C4 is noted related to facet hypertrophic changes. Skull base and vertebrae: 7 cervical segments are well visualized. Vertebral body height is well maintained. Multilevel facet hypertrophic changes are noted. This contributes to the anterolisthesis of C3 on C4. No acute fracture or acute facet abnormality is noted. Disc space narrowing is noted at multiple levels with associated osteophytic changes. Soft tissues and spinal canal: The surrounding soft tissues are within normal limits. Upper chest: Mild apical scarring is noted bilaterally. No focal confluent infiltrate is seen. No parenchymal nodules are noted. IMPRESSION: CT of the head: Chronic atrophic and ischemic changes without acute intracranial abnormality. CT of the cervical spine: Mild degenerative anterolisthesis of C3 on C4 related to the significant facet hypertrophic changes. Multilevel degenerative  change. CT of the maxillofacial bones: Soft tissue swelling in the left forehead and about the left orbit although no acute bony abnormality is seen. Electronically Signed   By: Alcide Clever M.D.   On: 01/13/2017 08:11   Ct Cervical Spine Wo Contrast  Result Date: 01/20/2017 CLINICAL DATA:  Fall today with head injury.  Headache and neck pain EXAM: CT HEAD WITHOUT CONTRAST CT CERVICAL SPINE WITHOUT CONTRAST TECHNIQUE: Multidetector CT imaging of the head and cervical spine was performed following the standard protocol without intravenous contrast. Multiplanar CT image reconstructions of the cervical spine were also generated. COMPARISON:  CT head and C-spine 01/13/2017 FINDINGS: CT HEAD FINDINGS Brain: Moderate atrophy. Chronic ischemic change in the white matter is stable. Chronic ischemia in the right cerebellum is stable. Negative for acute infarct. Negative for hemorrhage or mass Vascular: Atherosclerotic calcification. Negative for hyperdense vessel Skull: Thickening of the right frontal bone is chronic and benign, possible fibrous dysplasia. Sinuses/Orbits: Mild mucosal edema paranasal sinuses. Bilateral cataract removal. Other: Left frontal scalp hematoma CT CERVICAL SPINE FINDINGS Alignment: 3 mm anterolisthesis L3-4 unchanged. 4 mm anterolisthesis C7-T1 unchanged. Skull base and vertebrae: Negative for acute fracture Soft tissues and spinal canal: Negative Disc levels: Multilevel disc and facet degeneration. Anterolisthesis C3-4 and C7-T1 consistent with degenerative  change. Upper chest: Apical scarring.  No acute abnormality Other: None IMPRESSION: Left frontal scalp hematoma.  No acute intracranial abnormality Cervical spine degenerative change as above.  No acute fracture Electronically Signed   By: Marlan Palau M.D.   On: 01/20/2017 11:13   Ct Cervical Spine Wo Contrast  Result Date: 01/13/2017 CLINICAL DATA:  Fall this morning with facial pain and headaches, initial encounter EXAM: CT HEAD  WITHOUT CONTRAST CT MAXILLOFACIAL WITHOUT CONTRAST CT CERVICAL SPINE WITHOUT CONTRAST TECHNIQUE: Multidetector CT imaging of the head, cervical spine, and maxillofacial structures were performed using the standard protocol without intravenous contrast. Multiplanar CT image reconstructions of the cervical spine and maxillofacial structures were also generated. COMPARISON:  None. FINDINGS: CT HEAD FINDINGS Brain: Mild atrophic changes are noted. Chronic white matter ischemic change is seen. No findings to suggest acute hemorrhage, acute infarction or space-occupying mass lesion are noted. Vascular: No hyperdense vessel or unexpected calcification. Skull: Normal. Negative for fracture or focal lesion. Other: Mild soft tissue swelling is noted over the left forehead consistent with the recent injury. CT MAXILLOFACIAL FINDINGS Osseous: No acute bony abnormality is noted. Orbits: The globes are intact. No intra orbital abnormality is noted. Preseptal soft tissue swelling is noted consistent with the recent injury. Sinuses: Clear. Soft tissues: Soft tissue swelling is noted in the region of the left forehead and about the left orbit consistent with the recent injury. No other focal abnormality is noted. CT CERVICAL SPINE FINDINGS Alignment: Mild anterolisthesis of C3 on C4 is noted related to facet hypertrophic changes. Skull base and vertebrae: 7 cervical segments are well visualized. Vertebral body height is well maintained. Multilevel facet hypertrophic changes are noted. This contributes to the anterolisthesis of C3 on C4. No acute fracture or acute facet abnormality is noted. Disc space narrowing is noted at multiple levels with associated osteophytic changes. Soft tissues and spinal canal: The surrounding soft tissues are within normal limits. Upper chest: Mild apical scarring is noted bilaterally. No focal confluent infiltrate is seen. No parenchymal nodules are noted. IMPRESSION: CT of the head: Chronic atrophic  and ischemic changes without acute intracranial abnormality. CT of the cervical spine: Mild degenerative anterolisthesis of C3 on C4 related to the significant facet hypertrophic changes. Multilevel degenerative change. CT of the maxillofacial bones: Soft tissue swelling in the left forehead and about the left orbit although no acute bony abnormality is seen. Electronically Signed   By: Alcide Clever M.D.   On: 01/13/2017 08:11   Ct Hip Right Wo Contrast  Result Date: 01/20/2017 CLINICAL DATA:  Multiple falls. Right hip pain. History of remote right hip arthrodesis. EXAM: CT OF THE RIGHT HIP WITHOUT CONTRAST TECHNIQUE: Multidetector CT imaging of the right hip was performed according to the standard protocol. Multiplanar CT image reconstructions were also generated. COMPARISON:  CT scan 06/18/2016 FINDINGS: Chronic/ remote right hip arthrodesis. No acute fracture is identified. Stable degenerative changes involving the pubic symphysis and right SI joint. No fractures involving the right hemipelvis are identified. No significant intrapelvic abnormalities are identified. Advanced vascular calcifications without definite aneurysm. Small scattered inguinal lymph nodes are noted. Marked fatty atrophy of the right hip and pelvic musculature likely related to the patient's hip fusion. IMPRESSION: 1. Chronic right hip fusion. 2. No acute fracture involving the right hip or right hemipelvis. Electronically Signed   By: Rudie Meyer M.D.   On: 01/20/2017 11:57   Dg Shoulder Left  Result Date: 01/13/2017 CLINICAL DATA:  Un witnessed fall.  Left shoulder  pain. EXAM: LEFT SHOULDER - 2+ VIEW COMPARISON:  None. FINDINGS: Left shoulder is located. No evidence for an acute fracture. There is a left cardiac pacemaker. Old fracture involving a left rib. Normal alignment of the left AC joint. Scapula appears to be intact. IMPRESSION: No acute abnormality in the left shoulder. Electronically Signed   By: Richarda Overlie M.D.   On:  01/13/2017 08:22   Dg Knee Complete 4 Views Right  Result Date: 01/20/2017 CLINICAL DATA:  Larey Seat out of bed this morning. Right knee injury and pain. Initial encounter. EXAM: RIGHT KNEE - COMPLETE 4+ VIEW COMPARISON:  01/13/2017 FINDINGS: No evidence of acute fracture or dislocation. Small knee joint effusion is unchanged. Severe tricompartmental osteoarthritis shows no significant change. No other bone lesions identified. Peripheral vascular calcification again noted. IMPRESSION: Stable small knee joint effusion and severe tricompartmental osteoarthritis. No acute findings. Electronically Signed   By: Myles Rosenthal M.D.   On: 01/20/2017 10:49   Dg Knee Complete 4 Views Right  Result Date: 01/13/2017 CLINICAL DATA:  Recent fall with knee pain, initial encounter EXAM: RIGHT KNEE - COMPLETE 4+ VIEW COMPARISON:  None. FINDINGS: Significant tricompartmental degenerative change is noted. Small joint effusion is seen. No acute fracture or dislocation is noted. Vascular calcifications are noted. IMPRESSION: Significant degenerative change with small joint effusion. No acute bony abnormality is noted. Electronically Signed   By: Alcide Clever M.D.   On: 01/13/2017 08:24   Dg Hip Unilat W Or Wo Pelvis 2-3 Views Right  Result Date: 01/20/2017 CLINICAL DATA:  Right hip pain after fall.  Initial encounter. EXAM: DG HIP (WITH OR WITHOUT PELVIS) 2-3V RIGHT COMPARISON:  01/13/2017 FINDINGS: Right hip arthrodesis or ankylosis. Negative for acute fracture. No diastasis. Osteopenia.  Atherosclerosis. IMPRESSION: 1. No acute finding. 2. Right hip arthrodesis or ankylosis. Electronically Signed   By: Marnee Spring M.D.   On: 01/20/2017 10:49   Dg Hip Unilat W Or Wo Pelvis 2-3 Views Right  Result Date: 01/13/2017 CLINICAL DATA:  Unwitnessed fall at the skilled nursing facility, right leg pain EXAM: DG HIP (WITH OR WITHOUT PELVIS) 2-3V RIGHT COMPARISON:  05/31/2016 FINDINGS: Chronic osseous fusion of the right hip joint as  before. Degenerative changes of the lower lumbar spine, SI joints and left hip. Bones are osteopenic. No acute osseous finding or displaced fracture. Peripheral atherosclerosis noted. Normal bowel gas pattern. IMPRESSION: Degenerative changes and osteopenia. Chronic right hip fusion. No acute osseous finding by plain radiography Electronically Signed   By: Judie Petit.  Shick M.D.   On: 01/13/2017 08:27   Ct Maxillofacial Wo Contrast  Result Date: 01/13/2017 CLINICAL DATA:  Fall this morning with facial pain and headaches, initial encounter EXAM: CT HEAD WITHOUT CONTRAST CT MAXILLOFACIAL WITHOUT CONTRAST CT CERVICAL SPINE WITHOUT CONTRAST TECHNIQUE: Multidetector CT imaging of the head, cervical spine, and maxillofacial structures were performed using the standard protocol without intravenous contrast. Multiplanar CT image reconstructions of the cervical spine and maxillofacial structures were also generated. COMPARISON:  None. FINDINGS: CT HEAD FINDINGS Brain: Mild atrophic changes are noted. Chronic white matter ischemic change is seen. No findings to suggest acute hemorrhage, acute infarction or space-occupying mass lesion are noted. Vascular: No hyperdense vessel or unexpected calcification. Skull: Normal. Negative for fracture or focal lesion. Other: Mild soft tissue swelling is noted over the left forehead consistent with the recent injury. CT MAXILLOFACIAL FINDINGS Osseous: No acute bony abnormality is noted. Orbits: The globes are intact. No intra orbital abnormality is noted. Preseptal soft tissue swelling  is noted consistent with the recent injury. Sinuses: Clear. Soft tissues: Soft tissue swelling is noted in the region of the left forehead and about the left orbit consistent with the recent injury. No other focal abnormality is noted. CT CERVICAL SPINE FINDINGS Alignment: Mild anterolisthesis of C3 on C4 is noted related to facet hypertrophic changes. Skull base and vertebrae: 7 cervical segments are well  visualized. Vertebral body height is well maintained. Multilevel facet hypertrophic changes are noted. This contributes to the anterolisthesis of C3 on C4. No acute fracture or acute facet abnormality is noted. Disc space narrowing is noted at multiple levels with associated osteophytic changes. Soft tissues and spinal canal: The surrounding soft tissues are within normal limits. Upper chest: Mild apical scarring is noted bilaterally. No focal confluent infiltrate is seen. No parenchymal nodules are noted. IMPRESSION: CT of the head: Chronic atrophic and ischemic changes without acute intracranial abnormality. CT of the cervical spine: Mild degenerative anterolisthesis of C3 on C4 related to the significant facet hypertrophic changes. Multilevel degenerative change. CT of the maxillofacial bones: Soft tissue swelling in the left forehead and about the left orbit although no acute bony abnormality is seen. Electronically Signed   By: Alcide Clever M.D.   On: 01/13/2017 08:11   Initial Impression / Assessment and Plan / ED Course  I have reviewed the triage vital signs and the nursing notes.  Pertinent labs & imaging results that were available during my care of the patient were reviewed by me and considered in my medical decision making (see chart for details).     Laceration at present does not require repair  Final Clinical Impressions(s) / ED Diagnoses  Diagnosis #1 fall Final diagnoses:  None  #2 facial laceration #3 urinary tract infection  New Prescriptions New Prescriptions   No medications on file     Doug Sou, MD 01/20/17 1258

## 2017-01-20 NOTE — ED Notes (Signed)
Pts IV removed and pt dressed.

## 2017-01-22 LAB — URINE CULTURE
Culture: >=100000 — AB
SPECIAL REQUESTS: NORMAL

## 2017-01-23 ENCOUNTER — Telehealth: Payer: Self-pay | Admitting: *Deleted

## 2017-01-23 NOTE — Telephone Encounter (Signed)
Post ED Visit - Positive Culture Follow-up  Culture report reviewed by antimicrobial stewardship pharmacist:  [x] Nathan Batchelder, Pharm.D. [] Jeremy Frens, Pharm.D., BCPS AQ-ID [] Mike Maccia, Pharm.D., BCPS [] Elizabeth Martin, Pharm.D., BCPS [] Minh Pham, Pharm.D., BCPS, AAHIVP [] Michelle Turner, Pharm.D., BCPS, AAHIVP [] Rachel Rumbarger, PharmD, BCPS [] Taylor Stone, PharmD, BCPS [] Andy Johnston, PharmD, BCPS  Positive urine culture Treated with Cephalexin, organism sensitive to the same and no further patient follow-up is required at this time.  Kathy Peterson 01/23/2017, 11:13 AM   

## 2017-01-26 NOTE — ED Provider Notes (Signed)
[facility]-EMERGENCY DEPT Provider Note   CSN: [id number] Arrival date & time: 01/13/17  1478     History   Chief Complaint Chief Complaint  Patient presents with  . Fall    HPI Kathy Peterson is a 83 y.o. female.  HPI  83 year old female presenting after an unwitnessed fall. Patient states that she went to get up and felt dizzy and fell to the ground. She did strike her head. She doesn't think she lost consciousness. She complaining of some neck and back pain. No acute visual changes. No nausea or vomiting.  Past Medical History:  Diagnosis Date  . Arthritis    rheumatoid  . CHF (congestive heart failure) (HCC)   . COPD (chronic obstructive pulmonary disease) (HCC)   . Depression   . Diabetes mellitus without complication (HCC)    type 2  . Failure to thrive in adult   . Frequent falls   . GERD (gastroesophageal reflux disease)   . Hypertension   . Hyponatremia   . Orthostatic hypotension   . Pacemaker   . PAF (paroxysmal atrial fibrillation) (HCC)    a. ? mentioned in notes, no further details available.  . Pneumonia   . Polio   . Secondhand smoke exposure   . Shortness of breath   . Thyroid disease    hypothyroid    Patient Active Problem List   Diagnosis Date Noted  . Acute metabolic encephalopathy   . Altered mental status 12/14/2016  . MDD (major depressive disorder), recurrent severe, without psychosis (HCC) 04/08/2016  . COPD exacerbation (HCC) 04/08/2016  . History of pacemaker 04/08/2016  . QT prolongation 04/08/2016  . Episode of recurrent major depressive disorder (HCC)   . FTT (failure to thrive) in adult   . Hyponatremia 12/24/2015  . Rib fractures 12/23/2015  . Atrial fibrillation (HCC) 12/23/2015  . CHF (congestive heart failure) (HCC) 12/23/2015  . COPD (chronic obstructive pulmonary disease) (HCC) 12/23/2015  . Pre-diabetes 12/23/2015  . Essential (primary) hypertension 12/23/2015  . Hypothyroidism 12/23/2015  . Recurrent falls 12/23/2015     Past Surgical History:  Procedure Laterality Date  . ABDOMINAL HYSTERECTOMY    . CHOLECYSTECTOMY    . EYE SURGERY      OB History    Gravida Para Term Preterm AB Living   SAB TAB Ectopic Multiple Live Births           2       Home Medications    Prior to Admission medications   Medication Sig Start Date End Date Taking? Authorizing Provider  acetaminophen (TYLENOL) 500 MG tablet Take 500 mg by mouth 2 (two) times daily.    Yes [provider]  albuterol (PROVENTIL HFA;VENTOLIN HFA) 108 (90 Base) MCG/ACT inhaler Inhale 2 puffs into the lungs every 6 (six) hours as needed for wheezing or shortness of breath.   Yes [provider]  ARIPiprazole (ABILIFY) 5 MG tablet Take 5 mg by mouth daily.   Yes [provider]  Calcium Carb-Cholecalciferol (CALCIUM 600+D) 600-800 MG-UNIT TABS Take 1 tablet by mouth daily with breakfast.    Yes [provider]  Carboxymethylcellul-Glycerin (OPTIVE) 0.5-0.9 % SOLN Place 1 drop into both eyes every 12 (twelve) hours.   Yes [provider]  clonazePAM (KLONOPIN) 0.25 MG disintegrating tablet Take 0.25 mg by mouth daily with breakfast.   Yes [provider]  diltiazem (CARDIZEM) 60 MG tablet Take 60 mg by mouth 2 (  two) times daily.   Yes [provider]  docusate sodium (COLACE) 100 MG capsule Take 1 capsule (100 mg total) by mouth every Monday, Wednesday, and Friday. 12/17/16  Yes Scherrie Gerlach, MD  DULoxetine (CYMBALTA) 60 MG capsule Take 60 mg by mouth daily with breakfast.    Yes [provider]  feeding supplement, ENSURE ENLIVE, (ENSURE ENLIVE) LIQD Take 237 mLs by mouth 2 (two) times daily between meals. 04/12/16  Yes Albertine Grates, MD  fluticasone-salmeterol (ADVAIR HFA) [phone] MCG/ACT inhaler Inhale 2 puffs into the lungs 2 (two) times daily.   Yes [provider]  ipratropium (ATROVENT) 0.02 % nebulizer solution Take 0.5 mg by nebulization every 6 (six)  hours as needed for wheezing or shortness of breath.   Yes [provider]  levothyroxine (SYNTHROID, LEVOTHROID) 88 MCG tablet Take 88 mcg by mouth at bedtime.   Yes [provider]  Lidocaine 4 % PTCH Apply 1 patch topically every morning. Remove every night.   Yes [provider]  loratadine (CLARITIN) 10 MG tablet Take 10 mg by mouth daily with breakfast.    Yes [provider]  losartan (COZAAR) 25 MG tablet Take 25 mg by mouth daily with breakfast.    Yes [provider]  Melatonin 3 MG TABS Take 3 mg by mouth at bedtime.   Yes [provider]  Menthol, Topical Analgesic, (BIOFREEZE) 4 % GEL Apply 1 application topically 3 (three) times daily. Apply to right hip   Yes [provider]  Menthol, Topical Analgesic, 5 % GEL Apply 1 application topically See admin instructions. Apply to left side and back 3 times daily. Apply to left chest as needed for chest pain   Yes [provider]  pantoprazole (PROTONIX) 20 MG tablet Take 20 mg by mouth daily with breakfast.    Yes [provider]  psyllium (REGULOID) 0.52 g capsule Take 0.52 g by mouth at bedtime.   Yes [provider]  sodium chloride 1 g tablet Take 1 g by mouth daily.   Yes [provider]  traMADol (ULTRAM) 50 MG tablet Take 0.5 tablets (25 mg total) by mouth every 8 (eight) hours as needed (pain). Give 1/2 to 1 tablet by mouth every 8 hours as needed for pain that is uncontrolled by tylenol. Patient taking differently: Take 25 mg by mouth every 8 (eight) hours as needed for moderate pain.  04/12/16  Yes Albertine Grates, MD  traZODone (DESYREL) 50 MG tablet Take 25 mg by mouth 2 (two) times daily.    Yes [provider]  cephALEXin (KEFLEX) 500 MG capsule Take 1 capsule (500 mg total) by mouth 3 (three) times daily. 01/20/17   Doug Sou, MD    Family History Family History  Problem Relation Age of Onset  . Heart disease Mother   .  Stroke Neg Hx   . Diabetes Neg Hx   . Cancer Neg Hx     Social History Social History  Substance Use Topics  . Smoking status: Never Smoker  . Smokeless tobacco: Never Used  . Alcohol use No     Allergies   Bactrim [sulfamethoxazole-trimethoprim]; Penicillins; Pneumovax [pneumococcal polysaccharide vaccine]; Statins; and Sulfa antibiotics   Review of Systems Review of Systems   Physical Exam Updated Vital Signs BP (!) 142/66 (BP Location: Right Arm)   Pulse 79   Temp (!) 97.5 F (36.4 C) (Oral)   Resp 18   LMP  (LMP Unknown)   SpO2 97%  Physical Exam  Constitutional: She appears well-developed and well-nourished. No distress.  HENT:  Head: Normocephalic.  Mild left facial swelling left periorbital ecchymosis.  Eyes: Conjunctivae are normal. Right eye exhibits no discharge. Left eye exhibits no discharge.  Neck: Neck supple.  Cardiovascular: Normal rate, regular rhythm and normal heart sounds.  Exam reveals no gallop and no friction rub.   No murmur heard. Pulmonary/Chest: Effort normal and breath sounds normal. No respiratory distress.  Abdominal: Soft. She exhibits no distension. There is no tenderness.  Musculoskeletal: She exhibits no edema or tenderness.  No midline spinal tenderness. Range of motion large joints without apparent discomfort and no discrete bony tenderness the extremities.  Neurological: She is alert.  Speech is clear. Answers questions although she is little bit confused. Moves all extremities and follows commands. No focal motor deficit.  Skin: Skin is warm and dry.  Psychiatric: She has a normal mood and affect. Her behavior is normal. Thought content normal.  Nursing note and vitals reviewed.    ED Treatments / Results  Labs (all labs ordered are listed, but only abnormal results are displayed) Labs Reviewed  CBC WITH DIFFERENTIAL/PLATELET - Abnormal; Notable for the following:       Result Value   HCT 35.8 (*)    All other  components within normal limits  BASIC METABOLIC PANEL - Abnormal; Notable for the following:    Sodium 131 (*)    Chloride 95 (*)    Glucose, Bld 122 (*)    All other components within normal limits  URINALYSIS, ROUTINE W REFLEX MICROSCOPIC - Abnormal; Notable for the following:    Hgb urine dipstick TRACE (*)    Leukocytes, UA LARGE (*)    All other components within normal limits  URINALYSIS, MICROSCOPIC (REFLEX)    EKG  EKG Interpretation  Date/Time:  Tuesday January 13 2017 08:31:35 EDT Ventricular Rate:  89 PR Interval:    QRS Duration: 167 QT Interval:  472 QTC Calculation: 575 R Axis:   -66 Text Interpretation:  Atrial-sensed ventricular-paced rhythm No further analysis attempted due to paced rhythm Confirmed by Raeford Razor [phone]) on 01/13/2017 9:50:16 AM       Radiology No results found.   Dg Ribs Unilateral W/chest Left  Result Date: 01/13/2017 CLINICAL DATA:  Un witnessed fall with pain. EXAM: LEFT RIBS AND CHEST - 3+ VIEW COMPARISON:  12/14/2016 FINDINGS: Left cardiac dual lead pacemaker appears stable. Heart size is within normal limits. Atherosclerotic calcifications at the aortic arch. Old fractures involving the left seventh and eighth ribs. Probable old fracture involving the left ninth and tenth ribs. No evidence for an acute left rib fracture. Negative for a pneumothorax. Lungs are clear. Evidence for a coronary artery stent. IMPRESSION: Old left rib fractures.  No evidence for an acute left rib fracture. No acute chest findings. Electronically Signed   By: Richarda Overlie M.D.   On: 01/13/2017 08:26   Ct Head Wo Contrast  Result Date: 01/20/2017 CLINICAL DATA:  Fall today with head injury.  Headache and neck pain EXAM: CT HEAD WITHOUT CONTRAST CT CERVICAL SPINE WITHOUT CONTRAST TECHNIQUE: Multidetector CT imaging of the head and cervical spine was performed following the standard protocol without intravenous contrast. Multiplanar CT image reconstructions of the  cervical spine were also generated. COMPARISON:  CT head and C-spine 01/13/2017 FINDINGS: CT HEAD FINDINGS Brain: Moderate atrophy. Chronic ischemic change in the white matter is stable. Chronic ischemia in the right cerebellum is stable. Negative for acute infarct. Negative  for hemorrhage or mass Vascular: Atherosclerotic calcification. Negative for hyperdense vessel Skull: Thickening of the right frontal bone is chronic and benign, possible fibrous dysplasia. Sinuses/Orbits: Mild mucosal edema paranasal sinuses. Bilateral cataract removal. Other: Left frontal scalp hematoma CT CERVICAL SPINE FINDINGS Alignment: 3 mm anterolisthesis L3-4 unchanged. 4 mm anterolisthesis C7-T1 unchanged. Skull base and vertebrae: Negative for acute fracture Soft tissues and spinal canal: Negative Disc levels: Multilevel disc and facet degeneration. Anterolisthesis C3-4 and C7-T1 consistent with degenerative change. Upper chest: Apical scarring.  No acute abnormality Other: None IMPRESSION: Left frontal scalp hematoma.  No acute intracranial abnormality Cervical spine degenerative change as above.  No acute fracture Electronically Signed   By: Marlan Palau M.D.   On: 01/20/2017 11:13   Ct Head Wo Contrast  Result Date: 01/13/2017 CLINICAL DATA:  Fall this morning with facial pain and headaches, initial encounter EXAM: CT HEAD WITHOUT CONTRAST CT MAXILLOFACIAL WITHOUT CONTRAST CT CERVICAL SPINE WITHOUT CONTRAST TECHNIQUE: Multidetector CT imaging of the head, cervical spine, and maxillofacial structures were performed using the standard protocol without intravenous contrast. Multiplanar CT image reconstructions of the cervical spine and maxillofacial structures were also generated. COMPARISON:  None. FINDINGS: CT HEAD FINDINGS Brain: Mild atrophic changes are noted. Chronic white matter ischemic change is seen. No findings to suggest acute hemorrhage, acute infarction or space-occupying mass lesion are noted. Vascular: No  hyperdense vessel or unexpected calcification. Skull: Normal. Negative for fracture or focal lesion. Other: Mild soft tissue swelling is noted over the left forehead consistent with the recent injury. CT MAXILLOFACIAL FINDINGS Osseous: No acute bony abnormality is noted. Orbits: The globes are intact. No intra orbital abnormality is noted. Preseptal soft tissue swelling is noted consistent with the recent injury. Sinuses: Clear. Soft tissues: Soft tissue swelling is noted in the region of the left forehead and about the left orbit consistent with the recent injury. No other focal abnormality is noted. CT CERVICAL SPINE FINDINGS Alignment: Mild anterolisthesis of C3 on C4 is noted related to facet hypertrophic changes. Skull base and vertebrae: 7 cervical segments are well visualized. Vertebral body height is well maintained. Multilevel facet hypertrophic changes are noted. This contributes to the anterolisthesis of C3 on C4. No acute fracture or acute facet abnormality is noted. Disc space narrowing is noted at multiple levels with associated osteophytic changes. Soft tissues and spinal canal: The surrounding soft tissues are within normal limits. Upper chest: Mild apical scarring is noted bilaterally. No focal confluent infiltrate is seen. No parenchymal nodules are noted. IMPRESSION: CT of the head: Chronic atrophic and ischemic changes without acute intracranial abnormality. CT of the cervical spine: Mild degenerative anterolisthesis of C3 on C4 related to the significant facet hypertrophic changes. Multilevel degenerative change. CT of the maxillofacial bones: Soft tissue swelling in the left forehead and about the left orbit although no acute bony abnormality is seen. Electronically Signed   By: Alcide Clever M.D.   On: 01/13/2017 08:11   Ct Cervical Spine Wo Contrast  Result Date: 01/20/2017 CLINICAL DATA:  Fall today with head injury.  Headache and neck pain EXAM: CT HEAD WITHOUT CONTRAST CT CERVICAL SPINE  WITHOUT CONTRAST TECHNIQUE: Multidetector CT imaging of the head and cervical spine was performed following the standard protocol without intravenous contrast. Multiplanar CT image reconstructions of the cervical spine were also generated. COMPARISON:  CT head and C-spine 01/13/2017 FINDINGS: CT HEAD FINDINGS Brain: Moderate atrophy. Chronic ischemic change in the white matter is stable. Chronic ischemia in the right cerebellum is  stable. Negative for acute infarct. Negative for hemorrhage or mass Vascular: Atherosclerotic calcification. Negative for hyperdense vessel Skull: Thickening of the right frontal bone is chronic and benign, possible fibrous dysplasia. Sinuses/Orbits: Mild mucosal edema paranasal sinuses. Bilateral cataract removal. Other: Left frontal scalp hematoma CT CERVICAL SPINE FINDINGS Alignment: 3 mm anterolisthesis L3-4 unchanged. 4 mm anterolisthesis C7-T1 unchanged. Skull base and vertebrae: Negative for acute fracture Soft tissues and spinal canal: Negative Disc levels: Multilevel disc and facet degeneration. Anterolisthesis C3-4 and C7-T1 consistent with degenerative change. Upper chest: Apical scarring.  No acute abnormality Other: None IMPRESSION: Left frontal scalp hematoma.  No acute intracranial abnormality Cervical spine degenerative change as above.  No acute fracture Electronically Signed   By: Marlan Palau M.D.   On: 01/20/2017 11:13   Ct Cervical Spine Wo Contrast  Result Date: 01/13/2017 CLINICAL DATA:  Fall this morning with facial pain and headaches, initial encounter EXAM: CT HEAD WITHOUT CONTRAST CT MAXILLOFACIAL WITHOUT CONTRAST CT CERVICAL SPINE WITHOUT CONTRAST TECHNIQUE: Multidetector CT imaging of the head, cervical spine, and maxillofacial structures were performed using the standard protocol without intravenous contrast. Multiplanar CT image reconstructions of the cervical spine and maxillofacial structures were also generated. COMPARISON:  None. FINDINGS: CT HEAD  FINDINGS Brain: Mild atrophic changes are noted. Chronic white matter ischemic change is seen. No findings to suggest acute hemorrhage, acute infarction or space-occupying mass lesion are noted. Vascular: No hyperdense vessel or unexpected calcification. Skull: Normal. Negative for fracture or focal lesion. Other: Mild soft tissue swelling is noted over the left forehead consistent with the recent injury. CT MAXILLOFACIAL FINDINGS Osseous: No acute bony abnormality is noted. Orbits: The globes are intact. No intra orbital abnormality is noted. Preseptal soft tissue swelling is noted consistent with the recent injury. Sinuses: Clear. Soft tissues: Soft tissue swelling is noted in the region of the left forehead and about the left orbit consistent with the recent injury. No other focal abnormality is noted. CT CERVICAL SPINE FINDINGS Alignment: Mild anterolisthesis of C3 on C4 is noted related to facet hypertrophic changes. Skull base and vertebrae: 7 cervical segments are well visualized. Vertebral body height is well maintained. Multilevel facet hypertrophic changes are noted. This contributes to the anterolisthesis of C3 on C4. No acute fracture or acute facet abnormality is noted. Disc space narrowing is noted at multiple levels with associated osteophytic changes. Soft tissues and spinal canal: The surrounding soft tissues are within normal limits. Upper chest: Mild apical scarring is noted bilaterally. No focal confluent infiltrate is seen. No parenchymal nodules are noted. IMPRESSION: CT of the head: Chronic atrophic and ischemic changes without acute intracranial abnormality. CT of the cervical spine: Mild degenerative anterolisthesis of C3 on C4 related to the significant facet hypertrophic changes. Multilevel degenerative change. CT of the maxillofacial bones: Soft tissue swelling in the left forehead and about the left orbit although no acute bony abnormality is seen. Electronically Signed   By: Alcide Clever M.D.   On: 01/13/2017 08:11   Ct Hip Right Wo Contrast  Result Date: 01/20/2017 CLINICAL DATA:  Multiple falls. Right hip pain. History of remote right hip arthrodesis. EXAM: CT OF THE RIGHT HIP WITHOUT CONTRAST TECHNIQUE: Multidetector CT imaging of the right hip was performed according to the standard protocol. Multiplanar CT image reconstructions were also generated. COMPARISON:  CT scan 06/18/2016 FINDINGS: Chronic/ remote right hip arthrodesis. No acute fracture is identified. Stable degenerative changes involving the pubic symphysis and right SI joint. No fractures involving the right  hemipelvis are identified. No significant intrapelvic abnormalities are identified. Advanced vascular calcifications without definite aneurysm. Small scattered inguinal lymph nodes are noted. Marked fatty atrophy of the right hip and pelvic musculature likely related to the patient's hip fusion. IMPRESSION: 1. Chronic right hip fusion. 2. No acute fracture involving the right hip or right hemipelvis. Electronically Signed   By: Rudie Meyer M.D.   On: 01/20/2017 11:57   Dg Shoulder Left  Result Date: 01/13/2017 CLINICAL DATA:  Un witnessed fall.  Left shoulder pain. EXAM: LEFT SHOULDER - 2+ VIEW COMPARISON:  None. FINDINGS: Left shoulder is located. No evidence for an acute fracture. There is a left cardiac pacemaker. Old fracture involving a left rib. Normal alignment of the left AC joint. Scapula appears to be intact. IMPRESSION: No acute abnormality in the left shoulder. Electronically Signed   By: Richarda Overlie M.D.   On: 01/13/2017 08:22   Dg Knee Complete 4 Views Right  Result Date: 01/20/2017 CLINICAL DATA:  Larey Seat out of bed this morning. Right knee injury and pain. Initial encounter. EXAM: RIGHT KNEE - COMPLETE 4+ VIEW COMPARISON:  01/13/2017 FINDINGS: No evidence of acute fracture or dislocation. Small knee joint effusion is unchanged. Severe tricompartmental osteoarthritis shows no significant change. No  other bone lesions identified. Peripheral vascular calcification again noted. IMPRESSION: Stable small knee joint effusion and severe tricompartmental osteoarthritis. No acute findings. Electronically Signed   By: Myles Rosenthal M.D.   On: 01/20/2017 10:49   Dg Knee Complete 4 Views Right  Result Date: 01/13/2017 CLINICAL DATA:  Recent fall with knee pain, initial encounter EXAM: RIGHT KNEE - COMPLETE 4+ VIEW COMPARISON:  None. FINDINGS: Significant tricompartmental degenerative change is noted. Small joint effusion is seen. No acute fracture or dislocation is noted. Vascular calcifications are noted. IMPRESSION: Significant degenerative change with small joint effusion. No acute bony abnormality is noted. Electronically Signed   By: Alcide Clever M.D.   On: 01/13/2017 08:24   Dg Hip Unilat W Or Wo Pelvis 2-3 Views Right  Result Date: 01/20/2017 CLINICAL DATA:  Right hip pain after fall.  Initial encounter. EXAM: DG HIP (WITH OR WITHOUT PELVIS) 2-3V RIGHT COMPARISON:  01/13/2017 FINDINGS: Right hip arthrodesis or ankylosis. Negative for acute fracture. No diastasis. Osteopenia.  Atherosclerosis. IMPRESSION: 1. No acute finding. 2. Right hip arthrodesis or ankylosis. Electronically Signed   By: Marnee Spring M.D.   On: 01/20/2017 10:49   Dg Hip Unilat W Or Wo Pelvis 2-3 Views Right  Result Date: 01/13/2017 CLINICAL DATA:  Unwitnessed fall at the skilled nursing facility, right leg pain EXAM: DG HIP (WITH OR WITHOUT PELVIS) 2-3V RIGHT COMPARISON:  05/31/2016 FINDINGS: Chronic osseous fusion of the right hip joint as before. Degenerative changes of the lower lumbar spine, SI joints and left hip. Bones are osteopenic. No acute osseous finding or displaced fracture. Peripheral atherosclerosis noted. Normal bowel gas pattern. IMPRESSION: Degenerative changes and osteopenia. Chronic right hip fusion. No acute osseous finding by plain radiography Electronically Signed   By: Judie Petit.  Shick M.D.   On: 01/13/2017 08:27    Ct Maxillofacial Wo Contrast  Result Date: 01/13/2017 CLINICAL DATA:  Fall this morning with facial pain and headaches, initial encounter EXAM: CT HEAD WITHOUT CONTRAST CT MAXILLOFACIAL WITHOUT CONTRAST CT CERVICAL SPINE WITHOUT CONTRAST TECHNIQUE: Multidetector CT imaging of the head, cervical spine, and maxillofacial structures were performed using the standard protocol without intravenous contrast. Multiplanar CT image reconstructions of the cervical spine and maxillofacial structures were also generated. COMPARISON:  None. FINDINGS: CT HEAD FINDINGS Brain: Mild atrophic changes are noted. Chronic white matter ischemic change is seen. No findings to suggest acute hemorrhage, acute infarction or space-occupying mass lesion are noted. Vascular: No hyperdense vessel or unexpected calcification. Skull: Normal. Negative for fracture or focal lesion. Other: Mild soft tissue swelling is noted over the left forehead consistent with the recent injury. CT MAXILLOFACIAL FINDINGS Osseous: No acute bony abnormality is noted. Orbits: The globes are intact. No intra orbital abnormality is noted. Preseptal soft tissue swelling is noted consistent with the recent injury. Sinuses: Clear. Soft tissues: Soft tissue swelling is noted in the region of the left forehead and about the left orbit consistent with the recent injury. No other focal abnormality is noted. CT CERVICAL SPINE FINDINGS Alignment: Mild anterolisthesis of C3 on C4 is noted related to facet hypertrophic changes. Skull base and vertebrae: 7 cervical segments are well visualized. Vertebral body height is well maintained. Multilevel facet hypertrophic changes are noted. This contributes to the anterolisthesis of C3 on C4. No acute fracture or acute facet abnormality is noted. Disc space narrowing is noted at multiple levels with associated osteophytic changes. Soft tissues and spinal canal: The surrounding soft tissues are within normal limits. Upper chest: Mild  apical scarring is noted bilaterally. No focal confluent infiltrate is seen. No parenchymal nodules are noted. IMPRESSION: CT of the head: Chronic atrophic and ischemic changes without acute intracranial abnormality. CT of the cervical spine: Mild degenerative anterolisthesis of C3 on C4 related to the significant facet hypertrophic changes. Multilevel degenerative change. CT of the maxillofacial bones: Soft tissue swelling in the left forehead and about the left orbit although no acute bony abnormality is seen. Electronically Signed   By: Alcide Clever M.D.   On: 01/13/2017 08:11    Procedures Procedures (including critical care time)  Medications Ordered in ED Medications  morphine 4 MG/ML injection 4 mg (4 mg Intravenous Given 01/13/17 0913)     Initial Impression / Assessment and Plan / ED Course  I have reviewed the triage vital signs and the nursing notes.  Pertinent labs & imaging results that were available during my care of the patient were reviewed by me and considered in my medical decision making (see chart for details).     83 year old female presenting after fall. She is a little bit confused but from what she reports it sounds like this may have been mechanical. Evidence of some facial trauma but she does not appear to be distressed. CT of the head and cervical spine are without serious traumatic injury.  Final Clinical Impressions(s) / ED Diagnoses   Final diagnoses:  Fall, initial encounter  Injury of head, initial encounter  Contusion of face, initial encounter    New Prescriptions Discharge Medication List as of 01/13/2017 10:28 AM       Raeford Razor, MD 01/26/17 2039

## 2018-06-04 ENCOUNTER — Emergency Department (HOSPITAL_COMMUNITY): Payer: Medicare HMO

## 2018-06-04 ENCOUNTER — Emergency Department (HOSPITAL_COMMUNITY)
Admission: EM | Admit: 2018-06-04 | Discharge: 2018-06-05 | Disposition: A | Discharge status: Home / Self Care | Payer: Medicare HMO | Attending: Emergency Medicine | Admitting: Emergency Medicine

## 2018-06-04 DIAGNOSIS — I110 Hypertensive heart disease with heart failure: Secondary | ICD-10-CM | POA: Insufficient documentation

## 2018-06-04 DIAGNOSIS — M542 Cervicalgia: Secondary | ICD-10-CM | POA: Insufficient documentation

## 2018-06-04 DIAGNOSIS — I509 Heart failure, unspecified: Secondary | ICD-10-CM | POA: Diagnosis not present

## 2018-06-04 DIAGNOSIS — Z950 Presence of cardiac pacemaker: Secondary | ICD-10-CM | POA: Diagnosis not present

## 2018-06-04 DIAGNOSIS — W050XXA Fall from non-moving wheelchair, initial encounter: Secondary | ICD-10-CM | POA: Insufficient documentation

## 2018-06-04 DIAGNOSIS — W19XXXA Unspecified fall, initial encounter: Secondary | ICD-10-CM

## 2018-06-04 DIAGNOSIS — E119 Type 2 diabetes mellitus without complications: Secondary | ICD-10-CM | POA: Insufficient documentation

## 2018-06-04 DIAGNOSIS — Z79899 Other long term (current) drug therapy: Secondary | ICD-10-CM | POA: Insufficient documentation

## 2018-06-04 DIAGNOSIS — E039 Hypothyroidism, unspecified: Secondary | ICD-10-CM | POA: Insufficient documentation

## 2018-06-04 DIAGNOSIS — J449 Chronic obstructive pulmonary disease, unspecified: Secondary | ICD-10-CM | POA: Insufficient documentation

## 2018-06-04 NOTE — ED Provider Notes (Signed)
[facility]-EMERGENCY DEPT Provider Note   CSN: [id number] Arrival date & time: 06/04/18  1851     History   Chief Complaint Chief Complaint  Patient presents with  . Fall    HPI Ridley Spira is a 85 y.o. female.  The history is provided by the patient.  Fall  This is a new problem. The current episode started 1 to 2 hours ago. The problem has not changed since onset.Pertinent negatives include no chest pain, no abdominal pain, no headaches and no shortness of breath. Nothing aggravates the symptoms. Nothing relieves the symptoms. She has tried nothing for the symptoms. The treatment provided no relief.    Past Medical History:  Diagnosis Date  . Arthritis    rheumatoid  . CHF (congestive heart failure) (HCC)   . COPD (chronic obstructive pulmonary disease) (HCC)   . Depression   . Diabetes mellitus without complication (HCC)    type 2  . Failure to thrive in adult   . Frequent falls   . GERD (gastroesophageal reflux disease)   . Hypertension   . Hyponatremia   . Orthostatic hypotension   . Pacemaker   . PAF (paroxysmal atrial fibrillation) (HCC)    a. ? mentioned in notes, no further details available.  . Pneumonia   . Polio   . Secondhand smoke exposure   . Shortness of breath   . Thyroid disease    hypothyroid    Patient Active Problem List   Diagnosis Date Noted  . Acute metabolic encephalopathy   . Altered mental status 12/14/2016  . MDD (major depressive disorder), recurrent severe, without psychosis (HCC) 04/08/2016  . COPD exacerbation (HCC) 04/08/2016  . History of pacemaker 04/08/2016  . QT prolongation 04/08/2016  . Episode of recurrent major depressive disorder (HCC)   . FTT (failure to thrive) in adult   . Hyponatremia 12/24/2015  . Rib fractures 12/23/2015  . Atrial fibrillation (HCC) 12/23/2015  . CHF (congestive heart failure) (HCC) 12/23/2015  . COPD (chronic obstructive pulmonary disease) (HCC) 12/23/2015  .  Pre-diabetes 12/23/2015  . Essential (primary) hypertension 12/23/2015  . Hypothyroidism 12/23/2015  . Recurrent falls 12/23/2015    Past Surgical History:  Procedure Laterality Date  . ABDOMINAL HYSTERECTOMY    . CHOLECYSTECTOMY    . EYE SURGERY       OB History    Gravida  2   Para  2   Term  2   Preterm      AB      Living  2     SAB      TAB      Ectopic      Multiple      Live Births  2            Home Medications    Prior to Admission medications   Medication Sig Start Date End Date Taking? Authorizing Provider  acetaminophen (TYLENOL) 500 MG tablet Take 500 mg by mouth 2 (two) times daily.     [provider]  albuterol (PROVENTIL HFA;VENTOLIN HFA) 108 (90 Base) MCG/ACT inhaler Inhale 2 puffs into the lungs every 6 (six) hours as needed for wheezing or shortness of breath.    [provider]  ARIPiprazole (ABILIFY) 5 MG tablet Take 5 mg by mouth daily.    [provider]  Calcium Carb-Cholecalciferol (CALCIUM 600+D) 600-800 MG-UNIT TABS Take 1 tablet by mouth daily with breakfast.     [provider]  Carboxymethylcellul-Glycerin (OPTIVE)  0.5-0.9 % SOLN Place 1 drop into both eyes every 12 (twelve) hours.    [provider]  cephALEXin (KEFLEX) 500 MG capsule Take 1 capsule (500 mg total) by mouth 3 (three) times daily. 01/20/17   Doug SouJacubowitz, Sam, MD  clonazePAM (KLONOPIN) 0.25 MG disintegrating tablet Take 0.25 mg by mouth daily with breakfast.    [provider]  diltiazem (CARDIZEM) 60 MG tablet Take 60 mg by mouth 2 (two) times daily.    [provider]  docusate sodium (COLACE) 100 MG capsule Take 1 capsule (100 mg total) by mouth every Monday, Wednesday, and Friday. 12/17/16   Scherrie GerlachHuang, Jennifer, MD  DULoxetine (CYMBALTA) 60 MG capsule Take 60 mg by mouth daily with breakfast.     [provider]  feeding supplement, ENSURE ENLIVE, (ENSURE ENLIVE) LIQD Take 237 mLs by mouth 2 (two)  times daily between meals. 04/12/16   Albertine GratesXu, Fang, MD  fluticasone-salmeterol (ADVAIR HFA) [phone] MCG/ACT inhaler Inhale 2 puffs into the lungs 2 (two) times daily.    [provider]  ipratropium (ATROVENT) 0.02 % nebulizer solution Take 0.5 mg by nebulization every 6 (six) hours as needed for wheezing or shortness of breath.    [provider]  levothyroxine (SYNTHROID, LEVOTHROID) 88 MCG tablet Take 88 mcg by mouth at bedtime.    [provider]  Lidocaine 4 % PTCH Apply 1 patch topically every morning. Remove every night.    [provider]  loratadine (CLARITIN) 10 MG tablet Take 10 mg by mouth daily with breakfast.     [provider]  losartan (COZAAR) 25 MG tablet Take 25 mg by mouth daily with breakfast.     [provider]  Melatonin 3 MG TABS Take 3 mg by mouth at bedtime.    [provider]  Menthol, Topical Analgesic, (BIOFREEZE) 4 % GEL Apply 1 application topically 3 (three) times daily. Apply to right hip    [provider]  Menthol, Topical Analgesic, 5 % GEL Apply 1 application topically See admin instructions. Apply to left side and back 3 times daily. Apply to left chest as needed for chest pain    [provider]  pantoprazole (PROTONIX) 20 MG tablet Take 20 mg by mouth daily with breakfast.     [provider]  psyllium (REGULOID) 0.52 g capsule Take 0.52 g by mouth at bedtime.    [provider]  sodium chloride 1 g tablet Take 1 g by mouth daily.    [provider]  traMADol (ULTRAM) 50 MG tablet Take 0.5 tablets (25 mg total) by mouth every 8 (eight) hours as needed (pain). Give 1/2 to 1 tablet by mouth every 8 hours as needed for pain that is uncontrolled by tylenol. Patient taking differently: Take 25 mg by mouth every 8 (eight) hours as needed for moderate pain.  04/12/16   Albertine GratesXu, Fang, MD  traZODone (DESYREL) 50 MG tablet Take 25 mg by mouth 2 (two) times daily.      [provider]    Family History Family History  Problem Relation Age of Onset  . Heart disease Mother   . Stroke Neg Hx   . Diabetes Neg Hx   . Cancer Neg Hx     Social History Social History   Tobacco Use  . Smoking status: Never Smoker  . Smokeless tobacco: Never Used  Substance Use Topics  . Alcohol use: No  . Drug use: No     Allergies  Bactrim [sulfamethoxazole-trimethoprim]; Penicillins; Pneumovax [pneumococcal polysaccharide vaccine]; Statins; and Sulfa antibiotics   Review of Systems Review of Systems  Constitutional: Negative for chills and fever.  HENT: Negative for ear pain and sore throat.   Eyes: Negative for pain and visual disturbance.  Respiratory: Negative for cough and shortness of breath.   Cardiovascular: Negative for chest pain and palpitations.  Gastrointestinal: Negative for abdominal pain and vomiting.  Genitourinary: Negative for dysuria and hematuria.  Musculoskeletal: Positive for arthralgias and neck pain. Negative for back pain, gait problem, joint swelling and myalgias.  Skin: Negative for color change and rash.  Neurological: Negative for dizziness, tremors, seizures, syncope, facial asymmetry, speech difficulty, weakness, numbness and headaches.  All other systems reviewed and are negative.    Physical Exam Updated Vital Signs  ED Triage Vitals [06/04/18 1903]  Enc Vitals Group     BP (!) 154/87     Pulse      Resp 16     Temp 98 F (36.7 C)     Temp src      SpO2 98 %     Weight      Height      Head Circumference      Peak Flow      Pain Score      Pain Loc      Pain Edu?      Excl. in GC?     Physical Exam Vitals signs and nursing note reviewed.  Constitutional:      General: She is not in acute distress.    Appearance: She is well-developed.  HENT:     Head: Normocephalic and atraumatic.     Nose: Nose normal.     Mouth/Throat:     Mouth: Mucous membranes are moist.  Eyes:     Extraocular  Movements: Extraocular movements intact.     Conjunctiva/sclera: Conjunctivae normal.     Pupils: Pupils are equal, round, and reactive to light.  Neck:     Musculoskeletal: Normal range of motion and neck supple.  Cardiovascular:     Rate and Rhythm: Normal rate and regular rhythm.     Pulses: Normal pulses.     Heart sounds: Normal heart sounds. No murmur.  Pulmonary:     Effort: Pulmonary effort is normal. No respiratory distress.     Breath sounds: Normal breath sounds.  Abdominal:     Palpations: Abdomen is soft.     Tenderness: There is no abdominal tenderness.  Musculoskeletal: Normal range of motion.        General: Tenderness present.     Comments: TTP to right hip, paraspinal cervical muscle pain, no midline spinal pain   Skin:    General: Skin is warm and dry.     Capillary Refill: Capillary refill takes less than 2 seconds.  Neurological:     General: No focal deficit present.     Mental Status: She is alert and oriented to person, place, and time.     Cranial Nerves: No cranial nerve deficit.     Sensory: No sensory deficit.     Motor: No weakness.  Psychiatric:        Mood and Affect: Mood normal.      ED Treatments / Results  Labs (all labs ordered are listed, but only abnormal results are displayed) Labs Reviewed - No data to display  EKG None  Radiology Dg Chest 2 View  Result Date: 06/04/2018 CLINICAL DATA:  Fall. EXAM: CHEST - 2  VIEW COMPARISON:  01/13/2017 FINDINGS: Cardiac enlargement. Dual lead pacemaker unchanged. Retrocardiac density most likely atelectasis. Right lung clear. Negative for heart failure. IMPRESSION: Left lower lobe opacity likely atelectasis or infiltrate. Negative for edema or effusion. Electronically Signed   By: Marlan Palauharles  Clark M.D.   On: 06/04/2018 19:50   Ct Head Wo Contrast  Result Date: 06/04/2018 CLINICAL DATA:  85 y/o F; unwitnessed fall. Neck and upper back pain. EXAM: CT HEAD WITHOUT CONTRAST CT CERVICAL SPINE WITHOUT  CONTRAST TECHNIQUE: Multidetector CT imaging of the head and cervical spine was performed following the standard protocol without intravenous contrast. Multiplanar CT image reconstructions of the cervical spine were also generated. COMPARISON:  01/20/2017 CT head and cervical spine. FINDINGS: CT HEAD FINDINGS Brain: No evidence of acute infarction, hemorrhage, hydrocephalus, extra-axial collection or mass lesion/mass effect. Small chronic infarcts are present within the bilateral caudate heads, left thalamus, right cerebellum. Stable nonspecific white matter hypodensities compatible with chronic microvascular ischemic changes. Stable volume loss of the brain. Vascular: Calcific atherosclerosis of carotid siphons and vertebrobasilar system. No hyperdense vessel identified. Skull: Normal. Negative for fracture or focal lesion. Sinuses/Orbits: Right posterior ethmoid air cell partial opacification. Additional visible paranasal sinuses and the mastoid air cells are normally aerated. Bilateral intra-ocular lens replacement. Other: None. CT CERVICAL SPINE FINDINGS Alignment: Progression of grade 1 C3-4 anterolisthesis. Stable C4-5 and C7-T1 grade 1 anterolisthesis. Skull base and vertebrae: No acute fracture. No primary bone lesion or focal pathologic process. Soft tissues and spinal canal: No prevertebral fluid or swelling. No visible canal hematoma. Disc levels: Progressive disc and facet degenerative changes at the C3-4 level with loss of intervertebral disc space height and facet arthrosis. Stable advanced spondylosis at additional levels. Uncovertebral and facet hypertrophy results in bony foraminal stenosis at the left C3-4, bilateral C4-5, bilateral C5-6, right C6-7, bilateral C7-T1 levels. Mild C3-4 spinal canal stenosis. Upper chest: Calcified plaques in the lung apices bilaterally. Other: None. IMPRESSION: CT head: 1. No acute intracranial abnormality or calvarial fracture. 2. Stable chronic microvascular  ischemic changes, volume loss, and small chronic infarcts of the brain. CT cervical spine: 1. No acute fracture or dislocation identified. 2. Progression of spondylosis at the C3-4 level. Stable advanced spondylosis at additional levels. Electronically Signed   By: Mitzi HansenLance  Furusawa-Stratton M.D.   On: 06/04/2018 19:41   Ct Cervical Spine Wo Contrast  Result Date: 06/04/2018 CLINICAL DATA:  85 y/o F; unwitnessed fall. Neck and upper back pain. EXAM: CT HEAD WITHOUT CONTRAST CT CERVICAL SPINE WITHOUT CONTRAST TECHNIQUE: Multidetector CT imaging of the head and cervical spine was performed following the standard protocol without intravenous contrast. Multiplanar CT image reconstructions of the cervical spine were also generated. COMPARISON:  01/20/2017 CT head and cervical spine. FINDINGS: CT HEAD FINDINGS Brain: No evidence of acute infarction, hemorrhage, hydrocephalus, extra-axial collection or mass lesion/mass effect. Small chronic infarcts are present within the bilateral caudate heads, left thalamus, right cerebellum. Stable nonspecific white matter hypodensities compatible with chronic microvascular ischemic changes. Stable volume loss of the brain. Vascular: Calcific atherosclerosis of carotid siphons and vertebrobasilar system. No hyperdense vessel identified. Skull: Normal. Negative for fracture or focal lesion. Sinuses/Orbits: Right posterior ethmoid air cell partial opacification. Additional visible paranasal sinuses and the mastoid air cells are normally aerated. Bilateral intra-ocular lens replacement. Other: None. CT CERVICAL SPINE FINDINGS Alignment: Progression of grade 1 C3-4 anterolisthesis. Stable C4-5 and C7-T1 grade 1 anterolisthesis. Skull base and vertebrae: No acute fracture. No primary bone lesion or focal pathologic process. Soft tissues  and spinal canal: No prevertebral fluid or swelling. No visible canal hematoma. Disc levels: Progressive disc and facet degenerative changes at the C3-4  level with loss of intervertebral disc space height and facet arthrosis. Stable advanced spondylosis at additional levels. Uncovertebral and facet hypertrophy results in bony foraminal stenosis at the left C3-4, bilateral C4-5, bilateral C5-6, right C6-7, bilateral C7-T1 levels. Mild C3-4 spinal canal stenosis. Upper chest: Calcified plaques in the lung apices bilaterally. Other: None. IMPRESSION: CT head: 1. No acute intracranial abnormality or calvarial fracture. 2. Stable chronic microvascular ischemic changes, volume loss, and small chronic infarcts of the brain. CT cervical spine: 1. No acute fracture or dislocation identified. 2. Progression of spondylosis at the C3-4 level. Stable advanced spondylosis at additional levels. Electronically Signed   By: Mitzi Hansen M.D.   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 Mild degenerative change in the left hip. IMPRESSION: Negative for fracture. Electronically Signed   By: Marlan Palau M.D.   On: 06/04/2018 19:51    Procedures Procedures (including critical care time)  Medications Ordered in ED Medications - No data to display   Initial Impression / Assessment and Plan / ED Course  I have reviewed the triage vital signs and the nursing notes.  Pertinent labs & imaging results that were available during my care of the patient were reviewed by me and considered in my medical decision making (see chart for details).     Naryah Clenney is an 85 year old female with history of polio, COPD, diabetes who presents to the ED after mechanical fall.  Patient with normal vitals.  No fever.  Patient was transferred to her wheelchair and then slid out.  Has neck pain, right hip pain.  Normal neurological exam.  Has paraspinal  cervical muscle tenderness.  No midline spinal tenderness.  Head and neck CT were unremarkable.  Chest x-ray and pelvic x-ray showed no acute injuries.  No concern for pneumonia.  Likely atelectasis.  No abdominal tenderness on exam.  Overall patient is at her baseline.  No acute injuries are found.  Patient discharged back to facility.  Given return precautions.  This chart was dictated using voice recognition software.  Despite best efforts to proofread,  errors can occur which can change the documentation meaning.   Final Clinical Impressions(s) / ED Diagnoses   Final diagnoses:  Fall, initial encounter    ED Discharge Orders    None       Virgina Norfolk, DO 06/04/18 2025

## 2018-06-04 NOTE — Discharge Instructions (Addendum)
Negative imaging today.

## 2018-06-04 NOTE — ED Triage Notes (Signed)
Transported by [facility] from [facility] facility-- unwitnessed fall that occurred as patient was just to transfer from her wheelchair. AAO x 3 per baseline. DNR. Patient c/o neck and upper back pain. No blood thinners. No LOC.

## 2018-06-04 NOTE — ED Notes (Signed)
Bed: [facility] Expected date:  Expected time:  Means of arrival:  Comments: EMS fall 

## 2018-06-04 NOTE — ED Notes (Signed)
[facility] contacted for transport back to facility  

## 2018-06-05 NOTE — ED Notes (Signed)
Patient not able to sign for self. RN signed for patient. Report called to nursing facility.

## 2018-06-05 NOTE — ED Notes (Signed)
Opened chart to review d/c paperwork with Nursing Home

## 2018-09-07 IMAGING — CR DG CHEST 2V
2 series · 2 of 2 positions shown · non-contrast
Comparison: CT chest 12/23/2015

CLINICAL DATA: Syncope.  History CHF.  History diabetes.

EXAM:
CHEST  2 VIEW

[w chest lat]
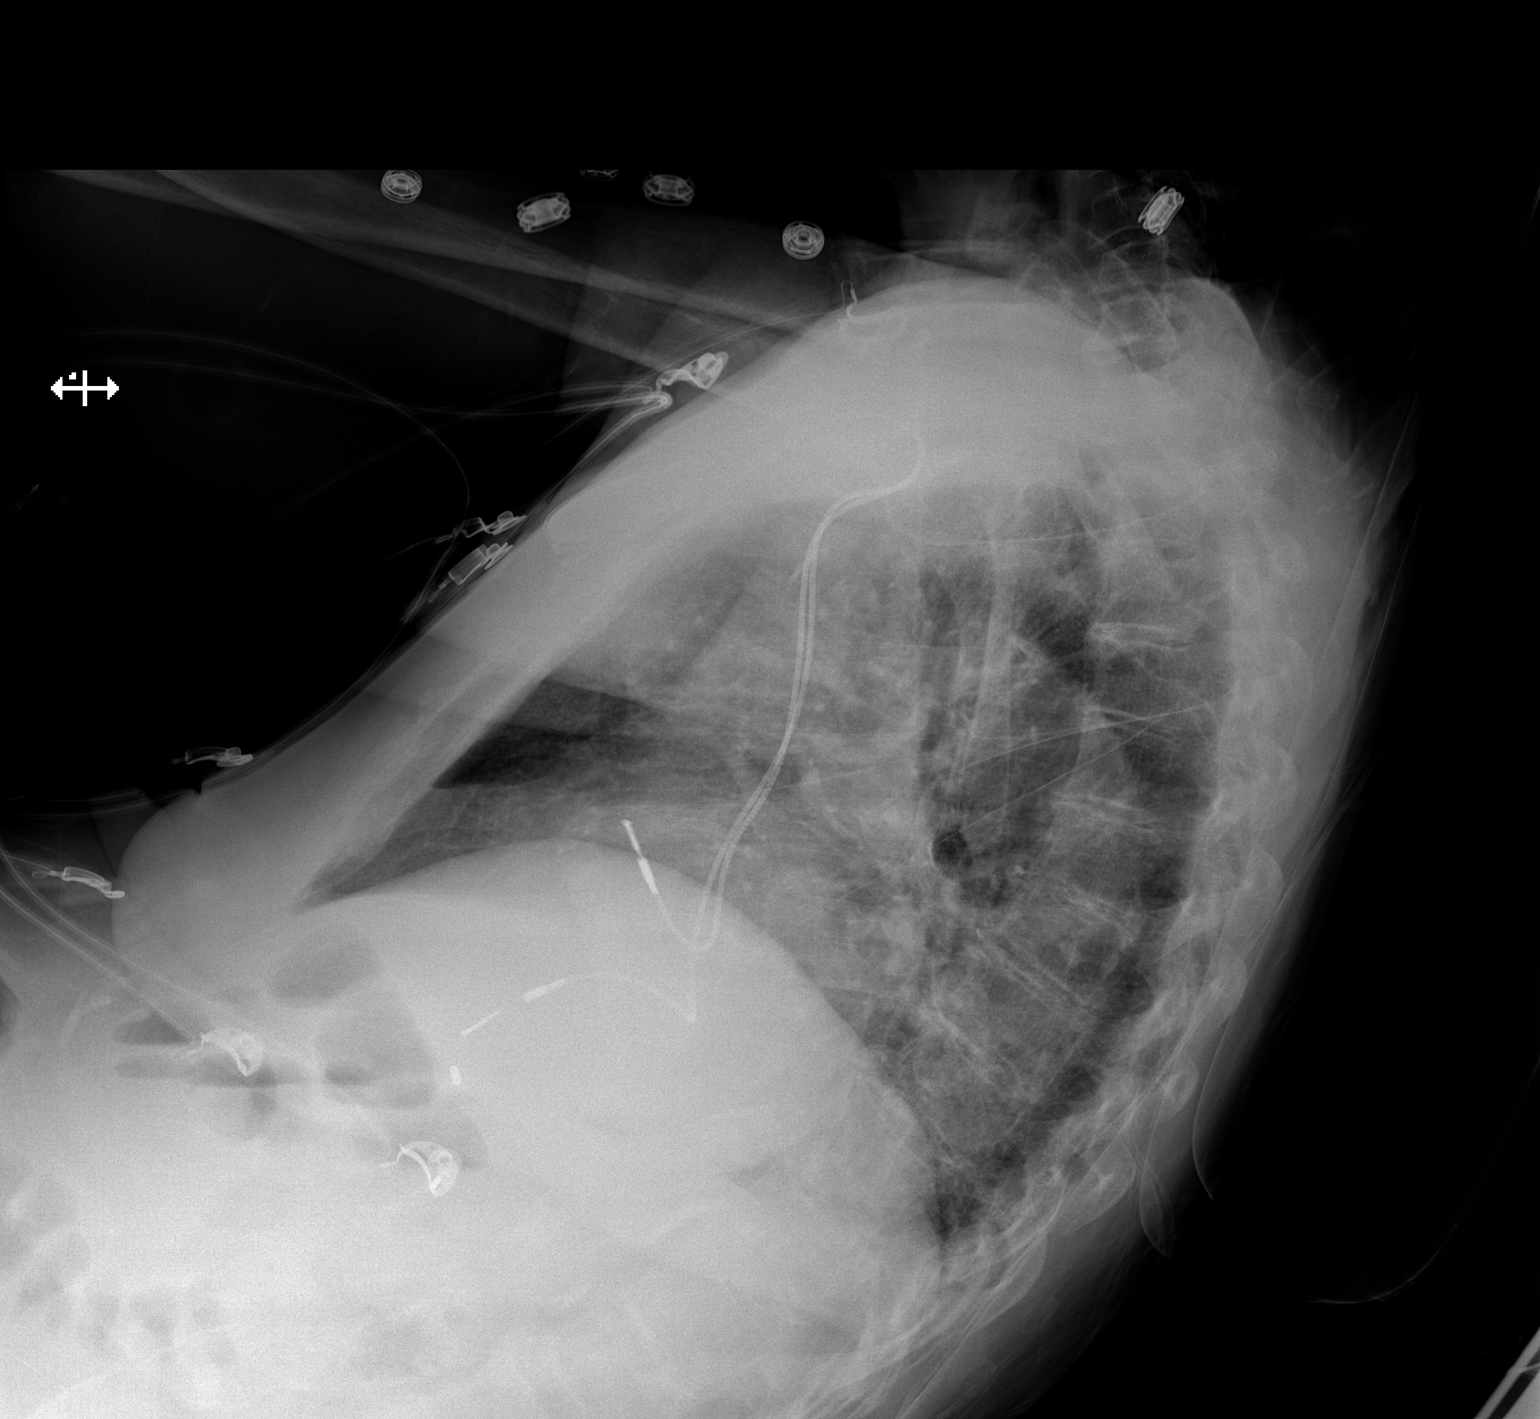

[x chest ap]
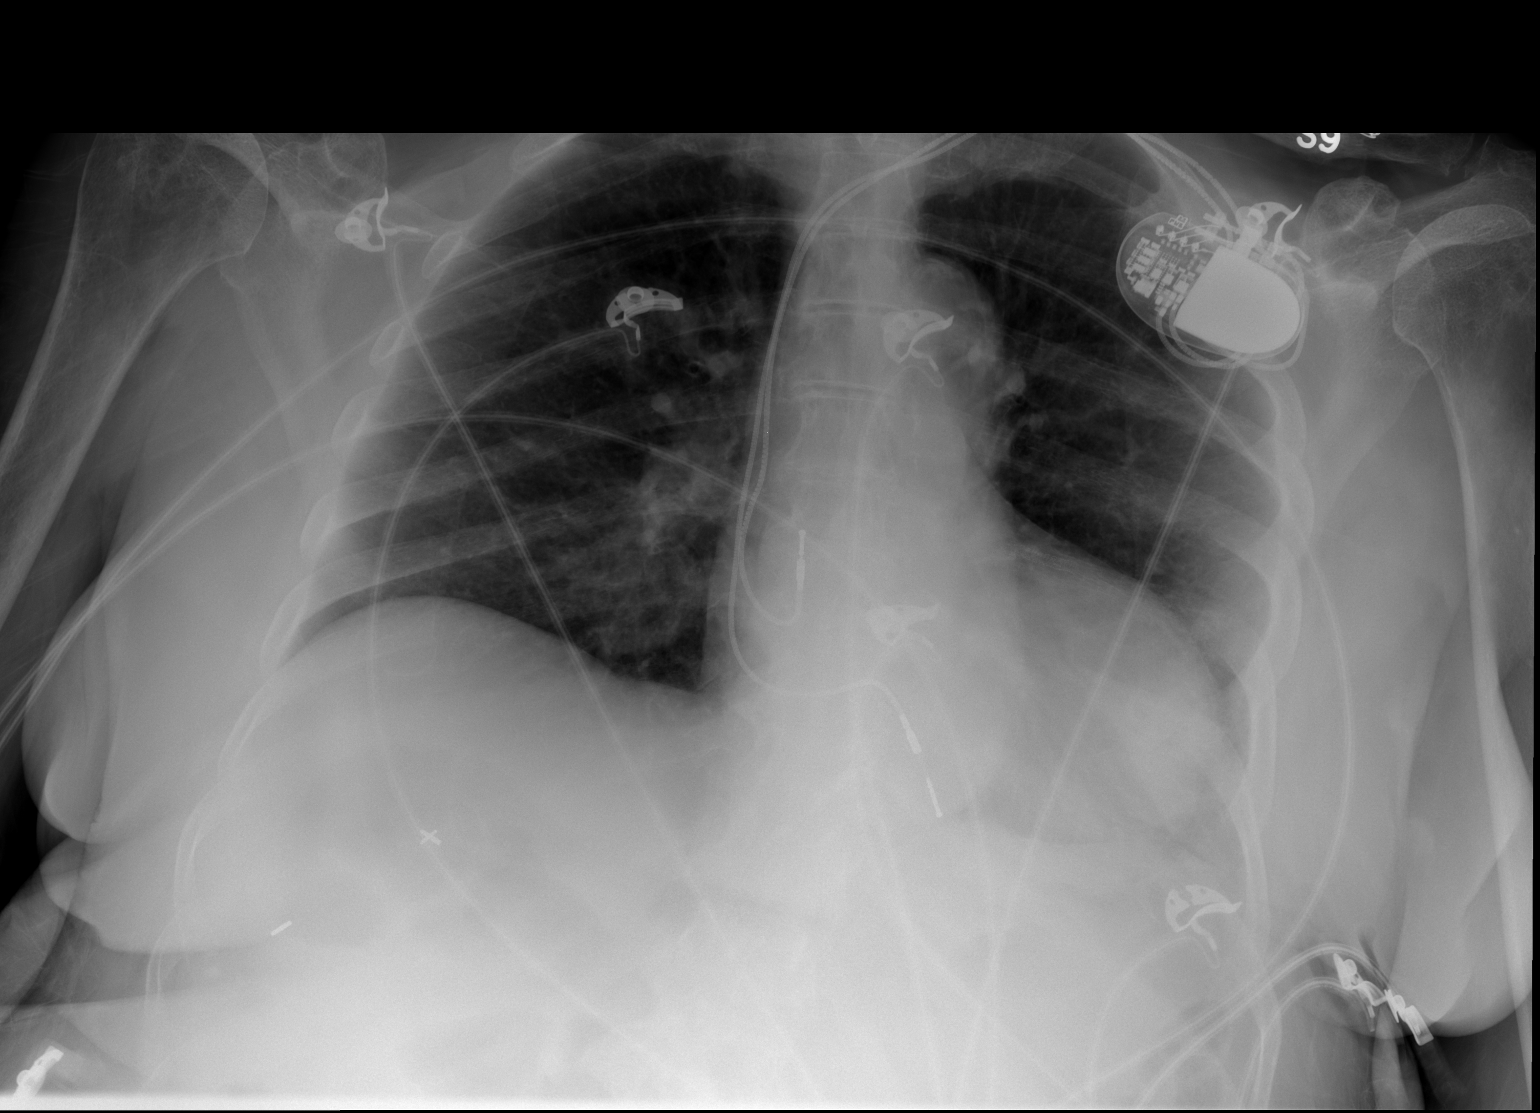

[2 of 2 positions shown; findings below may reference images not displayed]

FINDINGS: There is mild bilateral interstitial thickening. There is no focal
parenchymal opacity. There is no pleural effusion or pneumothorax.
The heart and mediastinal contours are unremarkable. There is a dual
lead AICD.

The osseous structures are unremarkable.
IMPRESSION: No active cardiopulmonary disease.

## 2019-05-02 ENCOUNTER — Inpatient Hospital Stay (HOSPITAL_COMMUNITY)
Admission: EM | Admit: 2019-05-02 | Discharge: 2019-05-05 | DRG: 178 | Disposition: A | Discharge status: Nursing Home (Includes ICF) | Payer: Medicare HMO | Attending: Internal Medicine | Admitting: Internal Medicine

## 2019-05-02 ENCOUNTER — Emergency Department (HOSPITAL_COMMUNITY): Payer: Medicare HMO

## 2019-05-02 ENCOUNTER — Encounter (HOSPITAL_COMMUNITY): Payer: Self-pay

## 2019-05-02 ENCOUNTER — Other Ambulatory Visit: Payer: Self-pay

## 2019-05-02 DIAGNOSIS — Z887 Allergy status to serum and vaccine status: Secondary | ICD-10-CM

## 2019-05-02 DIAGNOSIS — Z7951 Long term (current) use of inhaled steroids: Secondary | ICD-10-CM

## 2019-05-02 DIAGNOSIS — U071 COVID-19: Secondary | ICD-10-CM | POA: Diagnosis not present

## 2019-05-02 DIAGNOSIS — W19XXXA Unspecified fall, initial encounter: Secondary | ICD-10-CM | POA: Diagnosis present

## 2019-05-02 DIAGNOSIS — Z8701 Personal history of pneumonia (recurrent): Secondary | ICD-10-CM

## 2019-05-02 DIAGNOSIS — Z79890 Hormone replacement therapy (postmenopausal): Secondary | ICD-10-CM

## 2019-05-02 DIAGNOSIS — Z950 Presence of cardiac pacemaker: Secondary | ICD-10-CM

## 2019-05-02 DIAGNOSIS — N390 Urinary tract infection, site not specified: Secondary | ICD-10-CM | POA: Diagnosis present

## 2019-05-02 DIAGNOSIS — E119 Type 2 diabetes mellitus without complications: Secondary | ICD-10-CM | POA: Diagnosis present

## 2019-05-02 DIAGNOSIS — F0390 Unspecified dementia without behavioral disturbance: Secondary | ICD-10-CM | POA: Diagnosis present

## 2019-05-02 DIAGNOSIS — R0902 Hypoxemia: Secondary | ICD-10-CM | POA: Diagnosis not present

## 2019-05-02 DIAGNOSIS — E875 Hyperkalemia: Secondary | ICD-10-CM | POA: Diagnosis present

## 2019-05-02 DIAGNOSIS — Z66 Do not resuscitate: Secondary | ICD-10-CM | POA: Diagnosis present

## 2019-05-02 DIAGNOSIS — Z9049 Acquired absence of other specified parts of digestive tract: Secondary | ICD-10-CM

## 2019-05-02 DIAGNOSIS — Z79899 Other long term (current) drug therapy: Secondary | ICD-10-CM

## 2019-05-02 DIAGNOSIS — Z882 Allergy status to sulfonamides status: Secondary | ICD-10-CM

## 2019-05-02 DIAGNOSIS — E039 Hypothyroidism, unspecified: Secondary | ICD-10-CM | POA: Diagnosis present

## 2019-05-02 DIAGNOSIS — K219 Gastro-esophageal reflux disease without esophagitis: Secondary | ICD-10-CM | POA: Diagnosis present

## 2019-05-02 DIAGNOSIS — F329 Major depressive disorder, single episode, unspecified: Secondary | ICD-10-CM | POA: Diagnosis present

## 2019-05-02 DIAGNOSIS — Z888 Allergy status to other drugs, medicaments and biological substances status: Secondary | ICD-10-CM

## 2019-05-02 DIAGNOSIS — Z880 Allergy status to penicillin: Secondary | ICD-10-CM

## 2019-05-02 DIAGNOSIS — Z90710 Acquired absence of both cervix and uterus: Secondary | ICD-10-CM

## 2019-05-02 DIAGNOSIS — J449 Chronic obstructive pulmonary disease, unspecified: Secondary | ICD-10-CM | POA: Diagnosis present

## 2019-05-02 DIAGNOSIS — Z8249 Family history of ischemic heart disease and other diseases of the circulatory system: Secondary | ICD-10-CM

## 2019-05-02 DIAGNOSIS — I110 Hypertensive heart disease with heart failure: Secondary | ICD-10-CM | POA: Diagnosis present

## 2019-05-02 DIAGNOSIS — M069 Rheumatoid arthritis, unspecified: Secondary | ICD-10-CM | POA: Diagnosis present

## 2019-05-02 DIAGNOSIS — Y92099 Unspecified place in other non-institutional residence as the place of occurrence of the external cause: Secondary | ICD-10-CM

## 2019-05-02 DIAGNOSIS — I509 Heart failure, unspecified: Secondary | ICD-10-CM | POA: Diagnosis present

## 2019-05-02 LAB — URINALYSIS, ROUTINE W REFLEX MICROSCOPIC
Bilirubin Urine: NEGATIVE
Glucose, UA: NEGATIVE mg/dL
Hgb urine dipstick: NEGATIVE
Ketones, ur: 5 mg/dL — AB
Nitrite: POSITIVE — AB
Protein, ur: 100 mg/dL — AB
RBC / HPF: >50 RBC/hpf — ABNORMAL HIGH (ref 0–5)
Specific Gravity, Urine: 1.026 (ref 1.005–1.030)
WBC, UA: >50 WBC/hpf — ABNORMAL HIGH (ref 0–5)
pH: 5 (ref 5.0–8.0)

## 2019-05-02 LAB — RESPIRATORY PANEL BY RT PCR (FLU A&B, COVID)
Influenza A by PCR: NEGATIVE
Influenza B by PCR: NEGATIVE
SARS Coronavirus 2 by RT PCR: POSITIVE — AB

## 2019-05-02 LAB — CBC WITH DIFFERENTIAL/PLATELET
Abs Immature Granulocytes: 0.04 10*3/uL (ref 0.00–0.07)
Basophils Absolute: 0 10*3/uL (ref 0.0–0.1)
Basophils Relative: 0 %
Eosinophils Absolute: 0.1 10*3/uL (ref 0.0–0.5)
Eosinophils Relative: 1 %
HCT: 36.1 % (ref 36.0–46.0)
Hemoglobin: 11.7 g/dL — ABNORMAL LOW (ref 12.0–15.0)
Immature Granulocytes: 1 %
Lymphocytes Relative: 19 %
Lymphs Abs: 1.3 10*3/uL (ref 0.7–4.0)
MCH: 30.9 pg (ref 26.0–34.0)
MCHC: 32.4 g/dL (ref 30.0–36.0)
MCV: 95.3 fL (ref 80.0–100.0)
Monocytes Absolute: 0.6 10*3/uL (ref 0.1–1.0)
Monocytes Relative: 9 %
Neutro Abs: 5 10*3/uL (ref 1.7–7.7)
Neutrophils Relative %: 70 %
Platelets: 245 10*3/uL (ref 150–400)
RBC: 3.79 MIL/uL — ABNORMAL LOW (ref 3.87–5.11)
RDW: 12.2 % (ref 11.5–15.5)
WBC: 7.1 10*3/uL (ref 4.0–10.5)
nRBC: 0 % (ref 0.0–0.2)

## 2019-05-02 LAB — TROPONIN I (HIGH SENSITIVITY)
Troponin I (High Sensitivity): 13 ng/L (ref <18)
Troponin I (High Sensitivity): 15 ng/L (ref <18)

## 2019-05-02 LAB — COMPREHENSIVE METABOLIC PANEL
ALT: 12 U/L (ref 0–44)
AST: 23 U/L (ref 15–41)
Albumin: 3.3 g/dL — ABNORMAL LOW (ref 3.5–5.0)
Alkaline Phosphatase: 66 U/L (ref 38–126)
Anion gap: 8 (ref 5–15)
BUN: 27 mg/dL — ABNORMAL HIGH (ref 8–23)
CO2: 26 mmol/L (ref 22–32)
Calcium: 8.8 mg/dL — ABNORMAL LOW (ref 8.9–10.3)
Chloride: 104 mmol/L (ref 98–111)
Creatinine, Ser: 0.81 mg/dL (ref 0.44–1.00)
GFR calc Af Amer: >60 mL/min (ref >60)
GFR calc non Af Amer: >60 mL/min (ref >60)
Glucose, Bld: 119 mg/dL — ABNORMAL HIGH (ref 70–99)
Potassium: 5.3 mmol/L — ABNORMAL HIGH (ref 3.5–5.1)
Sodium: 138 mmol/L (ref 135–145)
Total Bilirubin: 0.9 mg/dL (ref 0.3–1.2)
Total Protein: 6.3 g/dL — ABNORMAL LOW (ref 6.5–8.1)

## 2019-05-02 LAB — BRAIN NATRIURETIC PEPTIDE: B Natriuretic Peptide: 160.2 pg/mL — ABNORMAL HIGH (ref 0.0–100.0)

## 2019-05-02 MED ORDER — SODIUM CHLORIDE 0.9 % IV SOLN
1.0000 g | Freq: Once | INTRAVENOUS | Status: AC
  Administered 2019-05-02: 1 g via INTRAVENOUS
  Filled 2019-05-02: qty 10

## 2019-05-02 NOTE — ED Notes (Signed)
Pt placed on purewick for urine collection. 

## 2019-05-02 NOTE — H&P (Signed)
History and Physical    Kathy Closennie Smullen MWU:[id number] DOB: 1926/06/15 DOA: 05/02/2019  PCP: Florentina Jennyripp, [place], MD  Patient coming from: [facility]illed nursing facility   Chief Complaint: Patient had an unwitnessed fall  HPI: Kathy Peterson is a 85 y.o. female with medical history significant of dementia, hypertension, hypothyroidism and COPD.I should emphasize that, due to her dementia, she is unable to provide any reliable history.  I therefore obtain my history by review of electronic medical records.  Patient was referred from her facility after an unwitnessed fall.  Unfortunately patient does not recollect circumstances leading to her fall.  She does not have any complaints or history at this time.  ED Course: At the ED, hemodynamically stable and afebrile with a respiratory of 26 and saturating 100% on 2 L/min.  She tested positive for SARS-CoV-2 and chest radiograph was negative for any acute infiltrate.  Urinalysis was suggestive of urinary tract infection.  Review of Systems: Unable to review system at this time due to dementia  Past Medical History:  Diagnosis Date   Arthritis    rheumatoid   CHF (congestive heart failure) (HCC)    COPD (chronic obstructive pulmonary disease) (HCC)    Depression    Diabetes mellitus without complication (HCC)    type 2   Failure to thrive in adult    Frequent falls    GERD (gastroesophageal reflux disease)    Hypertension    Hyponatremia    Orthostatic hypotension    Pacemaker    PAF (paroxysmal atrial fibrillation) (HCC)    a. ? mentioned in notes, no further details available.   Pneumonia    Polio    Secondhand smoke exposure    Shortness of breath    Thyroid disease    hypothyroid    Past Surgical History:  Procedure Laterality Date   ABDOMINAL HYSTERECTOMY     CHOLECYSTECTOMY     EYE SURGERY       reports that she has never smoked. She has never used smokeless tobacco. She reports that she does not drink alcohol or use drugs.  Allergies   Allergen Reactions   Bactrim [Sulfamethoxazole-Trimethoprim] Other (See Comments)    Unknown reaction per [facility]    Penicillins Other (See Comments)    [facility]  Has patient had a PCN reaction causing immediate rash, facial/tongue/throat swelling, SOB or lightheadedness with hypotension: Unsure Has patient had a PCN reaction causing severe rash involving mucus membranes or skin necrosis: Unsure Has patient had a PCN reaction that required hospitalization Unsure Has patient had a PCN reaction occurring within the last 10 years: Unsure If all of the above answers are "NO", then may proceed with Cephalosporin use.   Pneumovax [Pneumococcal Polysaccharide Vaccine] Other (See Comments)    Unknown reaction per [facility]    Statins Other (See Comments)    Unknown reaction per [facility]    Sulfa Antibiotics Other (See Comments)    [facility]     Family History  Problem Relation Age of Onset   Heart disease Mother    Stroke Neg Hx    Diabetes Neg Hx    Cancer Neg Hx    Unable to obtain family history due to dementia   Prior to Admission medications   Medication Sig Start Date End Date Taking? Authorizing Provider  acetaminophen (TYLENOL) 500 MG tablet Take 500 mg by mouth 2 (two) times daily.    Yes [provider]  albuterol (PROVENTIL HFA;VENTOLIN HFA) 108 (90 Base)  MCG/ACT inhaler Inhale 2 puffs into the lungs every 6 (six) hours as needed for wheezing or shortness of breath.   Yes [provider]  Aripiprazole 1 MG/ML mL Take 2 mg by mouth daily. 03/12/19  Yes [provider]  busPIRone (BUSPAR) 5 MG tablet Take 5 mg by mouth 2 (two) times daily as needed for anxiety. 03/16/19  Yes [provider]  Calcium Carb-Cholecalciferol (CALCIUM 600+D) 600-800 MG-UNIT TABS Take 1 tablet by mouth daily with breakfast.    Yes [provider]  Carboxymethylcellul-Glycerin (OPTIVE) 0.5-0.9 % SOLN Place 1 drop into both eyes every 12  (twelve) hours.   Yes [provider]  diltiazem (CARDIZEM) 60 MG tablet Take 60 mg by mouth 2 (two) times daily.   Yes [provider]  docusate sodium (COLACE) 100 MG capsule Take 1 capsule (100 mg total) by mouth every Monday, Wednesday, and Friday. 12/17/16  Yes Colbert Ewing, MD  escitalopram (LEXAPRO) 10 MG tablet Take 15 mg by mouth daily. 04/19/19  Yes [provider]  feeding supplement, ENSURE ENLIVE, (ENSURE ENLIVE) LIQD Take 237 mLs by mouth 2 (two) times daily between meals. 04/12/16  Yes Florencia Reasons, MD  ferrous sulfate 325 (65 FE) MG tablet Take 325 mg by mouth every Monday, Wednesday, and Friday.   Yes [provider]  fluticasone-salmeterol (ADVAIR HFA) 115-21 MCG/ACT inhaler Inhale 2 puffs into the lungs 2 (two) times daily.   Yes [provider]  levothyroxine (SYNTHROID, LEVOTHROID) 88 MCG tablet Take 88 mcg by mouth at bedtime.   Yes [provider]  Lidocaine 4 % PTCH Apply 1 patch topically every morning. Remove every night. For right hip.   Yes [provider]  loratadine (CLARITIN) 10 MG tablet Take 10 mg by mouth daily with breakfast.    Yes [provider]  losartan (COZAAR) 25 MG tablet Take 25 mg by mouth daily with breakfast.    Yes [provider]  Melatonin 3 MG TABS Take 3 mg by mouth at bedtime.   Yes [provider]  Menthol-Zinc Oxide (RISAMINE) 0.44-20.625 % OINT Apply 1 application topically every 12 (twelve) hours. Apply to groin/buttocks topically every shift for redness   Yes [provider]  mirtazapine (REMERON) 15 MG tablet Take 15 mg by mouth at bedtime. 03/31/19  Yes [provider]  pantoprazole (PROTONIX) 20 MG tablet Take 20 mg by mouth daily with breakfast.    Yes [provider]  psyllium (REGULOID) 0.52 g capsule Take 0.52 g by mouth at bedtime.   Yes [provider]  sodium chloride 1 g tablet Take 1 g by mouth daily.   Yes  [provider]    Physical Exam: Vitals:   05/02/19 1843 05/02/19 1849 05/02/19 2053 05/02/19 2330  BP:  (!) 148/76 (!) 143/70 134/67  Pulse:  98 92 90  Resp:  (!) 26 15 20   Temp:  98.6 F (37 C)    SpO2: 100% 100% 100% 99%    Constitutional: NAD, calm, comfortable Vitals:   05/02/19 1843 05/02/19 1849 05/02/19 2053 05/02/19 2330  BP:  (!) 148/76 (!) 143/70 134/67  Pulse:  98 92 90  Resp:  (!) 26 15 20   Temp:  98.6 F (37 C)    SpO2: 100% 100% 100% 99%   Eyes: PERRL, lids and conjunctivae normal ENMT: Mucous membranes are dry. Posterior pharynx clear of any exudate or lesions.Normal dentition.  Neck: normal, supple, no masses, no thyromegaly Respiratory: clear to  auscultation bilaterally, no wheezing, no crackles. Normal respiratory effort. No accessory muscle use.  Cardiovascular: Regular rate and rhythm, no murmurs / rubs / gallops. No extremity edema. 2+ pedal pulses. No carotid bruits.  Abdomen: no tenderness, no masses palpated. No hepatosplenomegaly. Bowel sounds positive.  Musculoskeletal: no clubbing / cyanosis. No joint deformity upper and lower extremities. Good ROM, no contractures. Normal muscle tone.  Skin: no rashes, lesions, ulcers. No induration Neurologic: Limited as patient follow instructions completely.  She moves her upper extremities.  Unable to assess cranial nerves  Psychiatric: Normal judgment and insight. Alert and  oriented to person only  Labs on Admission: I have personally reviewed following labs and imaging studies  CBC: Recent Labs  Lab 05/02/19 2003  WBC 7.1  NEUTROABS 5.0  HGB 11.7*  HCT 36.1  MCV 95.3  PLT 245   Basic Metabolic Panel: Recent Labs  Lab 05/02/19 2003  NA 138  K 5.3*  CL 104  CO2 26  GLUCOSE 119*  BUN 27*  CREATININE 0.81  CALCIUM 8.8*   GFR: CrCl cannot be calculated (Unknown ideal weight.). Liver Function Tests: Recent Labs  Lab 05/02/19 2003  AST 23  ALT 12  ALKPHOS 66  BILITOT 0.9    PROT 6.3*  ALBUMIN 3.3*   No results for input(s): LIPASE, AMYLASE in the last 168 hours. No results for input(s): AMMONIA in the last 168 hours. Coagulation Profile: No results for input(s): INR, PROTIME in the last 168 hours. Cardiac Enzymes: No results for input(s): CKTOTAL, CKMB, CKMBINDEX, TROPONINI in the last 168 hours. BNP (last 3 results) No results for input(s): PROBNP in the last 8760 hours. HbA1C: No results for input(s): HGBA1C in the last 72 hours. CBG: No results for input(s): GLUCAP in the last 168 hours. Lipid Profile: No results for input(s): CHOL, HDL, LDLCALC, TRIG, CHOLHDL, LDLDIRECT in the last 72 hours. Thyroid Function Tests: No results for input(s): TSH, T4TOTAL, FREET4, T3FREE, THYROIDAB in the last 72 hours. Anemia Panel: No results for input(s): VITAMINB12, FOLATE, FERRITIN, TIBC, IRON, RETICCTPCT in the last 72 hours. Urine analysis:    Component Value Date/Time   COLORURINE AMBER (A) 05/02/2019 1907   APPEARANCEUR CLOUDY (A) 05/02/2019 1907   LABSPEC 1.026 05/02/2019 1907   PHURINE 5.0 05/02/2019 1907   GLUCOSEU NEGATIVE 05/02/2019 1907   HGBUR NEGATIVE 05/02/2019 1907   BILIRUBINUR NEGATIVE 05/02/2019 1907   KETONESUR 5 (A) 05/02/2019 1907   PROTEINUR 100 (A) 05/02/2019 1907   NITRITE POSITIVE (A) 05/02/2019 1907   LEUKOCYTESUR MODERATE (A) 05/02/2019 1907    Radiological Exams on Admission: DG Pelvis 1-2 Views  Result Date: 05/02/2019 CLINICAL DATA:  Pain following fall EXAM: PELVIS - 1-2 VIEW COMPARISON:  June 04, 2018 FINDINGS: There is chronic ankylosis of the right hip joint which appear stable. No acute fracture or dislocation evident. There is moderate narrowing of the left hip joint, stable. No erosive change. There are foci of arterial vascular calcification which appear stable. IMPRESSION: No acute fracture or dislocation. 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 Moderate narrowing left hip joint, stable. Foci of arterial  vascular calcification noted. Electronically Signed   By: Bretta BangWilliam  Woodruff III M.D.   On: 05/02/2019 19:53   CT Head Wo Contrast  Result Date: 05/02/2019 CLINICAL DATA:  Unwitnessed fall.  Dementia EXAM: CT HEAD WITHOUT CONTRAST CT CERVICAL SPINE WITHOUT CONTRAST TECHNIQUE: Multidetector CT imaging of the head and cervical spine was performed following the standard protocol without intravenous contrast. Multiplanar CT  image reconstructions of the cervical spine were also generated. COMPARISON:  None. FINDINGS: CT HEAD FINDINGS Brain: No acute intracranial hemorrhage. No focal mass lesion. No CT evidence of acute infarction. No midline shift or mass effect. No hydrocephalus. Basilar cisterns are patent. Remote RIGHT occipital infarction. There are periventricular and subcortical white matter hypodensities. Generalized cortical atrophy. Vascular: No hyperdense vessel or unexpected calcification. Skull: Normal. Negative for fracture or focal lesion. Sinuses/Orbits: Paranasal sinuses and mastoid air cells are clear. Orbits are clear. Other: None. CT CERVICAL SPINE FINDINGS Alignment: Anterolisthesis of C3 on C4 described below. Straightening of the normal cervical lordosis. Skull base and vertebrae: Normal craniocervical junction. 5 mm of anterolisthesis of C3 on C4 is similar to 4 mm on 06/04/2018. There is loss of joint space at C3-C4 as well as sclerosis of the vertebral bodies along the subluxation. Mild anterolisthesis by 2 mm of C7 on T1. No acute loss vertebral by height or disc height.  There is Disc levels:  Multilevel facet hypertrophy. Upper chest: Biapical partially calcified pleuroparenchymal thickening. Other: None IMPRESSION: 1. No acute intracranial findings. 2. Remote RIGHT occipital infarction. 3. No evidence of acute cervical spine fracture. 4. Progressive anterolisthesis of C3 on C4 is similar to CT from 06/04/2018. 5. Multilevel bulky facet hypertrophy. Electronically Signed   By: Genevive Bi M.D.   On: 05/02/2019 20:01   CT Cervical Spine Wo Contrast  Result Date: 05/02/2019 CLINICAL DATA:  Unwitnessed fall.  Dementia EXAM: CT HEAD WITHOUT CONTRAST CT CERVICAL SPINE WITHOUT CONTRAST TECHNIQUE: Multidetector CT imaging of the head and cervical spine was performed following the standard protocol without intravenous contrast. Multiplanar CT image reconstructions of the cervical spine were also generated. COMPARISON:  None. FINDINGS: CT HEAD FINDINGS Brain: No acute intracranial hemorrhage. No focal mass lesion. No CT evidence of acute infarction. No midline shift or mass effect. No hydrocephalus. Basilar cisterns are patent. Remote RIGHT occipital infarction. There are periventricular and subcortical white matter hypodensities. Generalized cortical atrophy. Vascular: No hyperdense vessel or unexpected calcification. Skull: Normal. Negative for fracture or focal lesion. Sinuses/Orbits: Paranasal sinuses and mastoid air cells are clear. Orbits are clear. Other: None. CT CERVICAL SPINE FINDINGS Alignment: Anterolisthesis of C3 on C4 described below. Straightening of the normal cervical lordosis. Skull base and vertebrae: Normal craniocervical junction. 5 mm of anterolisthesis of C3 on C4 is similar to 4 mm on 06/04/2018. There is loss of joint space at C3-C4 as well as sclerosis of the vertebral bodies along the subluxation. Mild anterolisthesis by 2 mm of C7 on T1. No acute loss vertebral by height or disc height.  There is Disc levels:  Multilevel facet hypertrophy. Upper chest: Biapical partially calcified pleuroparenchymal thickening. Other: None IMPRESSION: 1. No acute intracranial findings. 2. Remote RIGHT occipital infarction. 3. No evidence of acute cervical spine fracture. 4. Progressive anterolisthesis of C3 on C4 is similar to CT from 06/04/2018. 5. Multilevel bulky facet hypertrophy. Electronically Signed   By: Genevive Bi M.D.   On: 05/02/2019 20:01   DG Chest Port 1  View  Result Date: 05/02/2019 CLINICAL DATA:  Pain following fall EXAM: PORTABLE CHEST 1 VIEW COMPARISON:  June 04, 2018 FINDINGS: The lungs are clear. Heart size and pulmonary vascularity are normal. Pacemaker leads are attached to the right atrium and right ventricle. There is aortic atherosclerosis. No pneumothorax. No bone lesions. IMPRESSION: Lungs clear. Stable cardiac silhouette. Pacemaker leads attached to right atrium and right ventricle. Aortic Atherosclerosis (ICD10-I70.0). Electronically Signed   By: Chrissie Noa  Margarita Grizzle III M.D.   On: 05/02/2019 19:54    EKG: Independently reviewed.   Assessment/Plan Active Problems:   COVID-19 virus infection   1.  COVID-19 infection: Patient does not have evidence of pneumonia or respiratory compromise.  We will continue supportive care for now.  Vitamin C and zinc supplementation.  Monitor clinical course of patient  2.  Urinary tract infection: She will be continued on ceftriaxone.  Follow-up on urine cultures  3.  Dementia: Monitor for behavioral changes.    4.  Hypertension: Monitor blood pressure profile closely.  Continue diltiazem and losartan   5.  Hypothyroidism: Continue levothyroxine  6.  Hyperkalemia without EKG changes.  Repeat potassium levels.  Cardiac monitoring.   DVT prophylaxis: Lovenox  Code Status: DNR  Family Communication: Unable to reach family for discussion as at the time of dictation. Disposition Plan: Patient to be discharged back to facility in the next 24 to 48 hours Consults called: None Admission status: Observation  Leonette Nutting MD [facility] Pager [phone]  If 7PM-7AM, please contact night-coverage [email or web address] Password [facility]  05/02/2019, 11:49 PM

## 2019-05-02 NOTE — ED Triage Notes (Signed)
Per EMS Pt is coming from [facility]. Pt had an unwitnessed fall, unknown how long on ground. Pt is covid POS, and sat at 83% RA. Pt now 100% on RA. Some pain on palpation in C-Spine area. No trauma/bleeding noted. Pt has hx of dementia, at baseline for neurological status.

## 2019-05-02 NOTE — ED Notes (Signed)
Patient transported to CT 

## 2019-05-02 NOTE — ED Provider Notes (Signed)
[facility]-EMERGENCY DEPT Provider Note   CSN: [id number] Arrival date & time: 05/02/19  1831     History No chief complaint on file.   Kathy Peterson is a 85 y.o. female.  85 year old female, DNR, with prior medical history as documented below presents for evaluation following unwitnessed fall at her facility. Patient is DNR. She has advanced dementia and cannot provide significant history - level 5 caveat secondary to same.  She reportedly was hypoxic on initial EMS evaluation (into the 80's).  She apparently has recently diagnosed with COVID - timeframe of diagnosis is unclear.   The history is provided by the patient and medical records.  Fall This is a new problem. The current episode started 6 to 12 hours ago. The problem occurs rarely. The problem has not changed since onset.Pertinent negatives include no chest pain, no abdominal pain, no headaches and no shortness of breath. Nothing aggravates the symptoms. Nothing relieves the symptoms.       Past Medical History:  Diagnosis Date  . Arthritis    rheumatoid  . CHF (congestive heart failure) (HCC)   . COPD (chronic obstructive pulmonary disease) (HCC)   . Depression   . Diabetes mellitus without complication (HCC)    type 2  . Failure to thrive in adult   . Frequent falls   . GERD (gastroesophageal reflux disease)   . Hypertension   . Hyponatremia   . Orthostatic hypotension   . Pacemaker   . PAF (paroxysmal atrial fibrillation) (HCC)    a. ? mentioned in notes, no further details available.  . Pneumonia   . Polio   . Secondhand smoke exposure   . Shortness of breath   . Thyroid disease    hypothyroid    Patient Active Problem List   Diagnosis Date Noted  . Acute metabolic encephalopathy   . Altered mental status 12/14/2016  . MDD (major depressive disorder), recurrent severe, without psychosis (HCC) 04/08/2016  . COPD exacerbation (HCC) 04/08/2016  . History of pacemaker 04/08/2016    . QT prolongation 04/08/2016  . Episode of recurrent major depressive disorder (HCC)   . FTT (failure to thrive) in adult   . Hyponatremia 12/24/2015  . Rib fractures 12/23/2015  . Atrial fibrillation (HCC) 12/23/2015  . CHF (congestive heart failure) (HCC) 12/23/2015  . COPD (chronic obstructive pulmonary disease) (HCC) 12/23/2015  . Pre-diabetes 12/23/2015  . Essential (primary) hypertension 12/23/2015  . Hypothyroidism 12/23/2015  . Recurrent falls 12/23/2015    Past Surgical History:  Procedure Laterality Date  . ABDOMINAL HYSTERECTOMY    . CHOLECYSTECTOMY    . EYE SURGERY       OB History    Gravida  2   Para  2   Term  2   Preterm      AB      Living  2     SAB      TAB      Ectopic      Multiple      Live Births  2           Family History  Problem Relation Age of Onset  . Heart disease Mother   . Stroke Neg Hx   . Diabetes Neg Hx   . Cancer Neg Hx     Social History   Tobacco Use  . Smoking status: Never Smoker  . Smokeless tobacco: Never Used  Substance Use Topics  . Alcohol use: No  . Drug  use: No    Home Medications Prior to Admission medications   Medication Sig Start Date End Date Taking? Authorizing Provider  acetaminophen (TYLENOL) 500 MG tablet Take 500 mg by mouth 2 (two) times daily.     [provider]  albuterol (PROVENTIL HFA;VENTOLIN HFA) 108 (90 Base) MCG/ACT inhaler Inhale 2 puffs into the lungs every 6 (six) hours as needed for wheezing or shortness of breath.    [provider]  ARIPiprazole (ABILIFY) 5 MG tablet Take 5 mg by mouth daily.    [provider]  Calcium Carb-Cholecalciferol (CALCIUM 600+D) 600-800 MG-UNIT TABS Take 1 tablet by mouth daily with breakfast.     [provider]  Carboxymethylcellul-Glycerin (OPTIVE) 0.5-0.9 % SOLN Place 1 drop into both eyes every 12 (twelve) hours.    [provider]  cephALEXin (KEFLEX) 500 MG capsule Take 1 capsule (500 mg  total) by mouth 3 (three) times daily. 01/20/17   Orlie Dakin, MD  clonazePAM (KLONOPIN) 0.25 MG disintegrating tablet Take 0.25 mg by mouth daily with breakfast.    [provider]  diltiazem (CARDIZEM) 60 MG tablet Take 60 mg by mouth 2 (two) times daily.    [provider]  docusate sodium (COLACE) 100 MG capsule Take 1 capsule (100 mg total) by mouth every Monday, Wednesday, and Friday. 12/17/16   Colbert Ewing, MD  DULoxetine (CYMBALTA) 60 MG capsule Take 60 mg by mouth daily with breakfast.     [provider]  feeding supplement, ENSURE ENLIVE, (ENSURE ENLIVE) LIQD Take 237 mLs by mouth 2 (two) times daily between meals. 04/12/16   Florencia Reasons, MD  fluticasone-salmeterol (ADVAIR HFA) [id number] MCG/ACT inhaler Inhale 2 puffs into the lungs 2 (two) times daily.    [provider]  ipratropium (ATROVENT) 0.02 % nebulizer solution Take 0.5 mg by nebulization every 6 (six) hours as needed for wheezing or shortness of breath.    [provider]  levothyroxine (SYNTHROID, LEVOTHROID) 88 MCG tablet Take 88 mcg by mouth at bedtime.    [provider]  Lidocaine 4 % PTCH Apply 1 patch topically every morning. Remove every night.    [provider]  loratadine (CLARITIN) 10 MG tablet Take 10 mg by mouth daily with breakfast.     [provider]  losartan (COZAAR) 25 MG tablet Take 25 mg by mouth daily with breakfast.     [provider]  Melatonin 3 MG TABS Take 3 mg by mouth at bedtime.    [provider]  Menthol, Topical Analgesic, (BIOFREEZE) 4 % GEL Apply 1 application topically 3 (three) times daily. Apply to right hip    [provider]  Menthol, Topical Analgesic, 5 % GEL Apply 1 application topically See admin instructions. Apply to left side and back 3 times daily. Apply to left chest as needed for chest pain    [provider]  pantoprazole (PROTONIX) 20 MG tablet Take 20 mg by mouth daily  with breakfast.     [provider]  psyllium (REGULOID) 0.52 g capsule Take 0.52 g by mouth at bedtime.    [provider]  sodium chloride 1 g tablet Take 1 g by mouth daily.    [provider]  traMADol (ULTRAM) 50 MG tablet Take 0.5 tablets (25 mg total) by mouth every 8 (eight) hours as needed (pain). Give 1/2 to 1 tablet by mouth every 8 hours as needed for pain that is uncontrolled by tylenol. Patient taking differently: Take  25 mg by mouth every 8 (eight) hours as needed for moderate pain.  04/12/16   Albertine Grates, MD  traZODone (DESYREL) 50 MG tablet Take 25 mg by mouth 2 (two) times daily.     [provider]    Allergies    Bactrim [sulfamethoxazole-trimethoprim], Penicillins, Pneumovax [pneumococcal polysaccharide vaccine], Statins, and Sulfa antibiotics  Review of Systems   Review of Systems  Respiratory: Negative for shortness of breath.   Cardiovascular: Negative for chest pain.  Gastrointestinal: Negative for abdominal pain.  Neurological: Negative for headaches.  All other systems reviewed and are negative.   Physical Exam Updated Vital Signs BP (!) 143/70   Pulse 92   Temp 98.6 F (37 C)   Resp 15   LMP  (LMP Unknown)   SpO2 100%   Physical Exam Vitals and nursing note reviewed.  Constitutional:      General: She is not in acute distress.    Appearance: Normal appearance. She is well-developed.  HENT:     Head: Normocephalic and atraumatic.  Eyes:     Conjunctiva/sclera: Conjunctivae normal.     Pupils: Pupils are equal, round, and reactive to light.  Cardiovascular:     Rate and Rhythm: Normal rate and regular rhythm.     Heart sounds: Normal heart sounds.  Pulmonary:     Effort: Pulmonary effort is normal. No respiratory distress.     Breath sounds: Normal breath sounds.  Abdominal:     General: There is no distension.     Palpations: Abdomen is soft.     Tenderness: There is no abdominal tenderness.    Musculoskeletal:        General: No deformity. Normal range of motion.     Cervical back: Normal range of motion and neck supple.     Comments: No bony tenderness to bilateral lateral hips   Skin:    General: Skin is warm and dry.  Neurological:     General: No focal deficit present.     Mental Status: She is alert. Mental status is at baseline.     Cranial Nerves: No cranial nerve deficit.     Sensory: No sensory deficit.     Motor: No weakness.     Coordination: Coordination normal.     ED Results / Procedures / Treatments   Labs (all labs ordered are listed, but only abnormal results are displayed) Labs Reviewed  RESPIRATORY PANEL BY RT PCR (FLU A&B, COVID) - Abnormal; Notable for the following components:      Result Value   SARS Coronavirus 2 by RT PCR POSITIVE (*)    All other components within normal limits  URINALYSIS, ROUTINE W REFLEX MICROSCOPIC - Abnormal; Notable for the following components:   Color, Urine AMBER (*)    APPearance CLOUDY (*)    Ketones, ur 5 (*)    Protein, ur 100 (*)    Nitrite POSITIVE (*)    Leukocytes,Ua MODERATE (*)    RBC / HPF >50 (*)    WBC, UA >50 (*)    Bacteria, UA MANY (*)    All other components within normal limits  COMPREHENSIVE METABOLIC PANEL - Abnormal; Notable for the following components:   Potassium 5.3 (*)    Glucose, Bld 119 (*)    BUN 27 (*)    Calcium 8.8 (*)    Total Protein 6.3 (*)    Albumin 3.3 (*)    All other components within normal limits  CBC WITH DIFFERENTIAL/PLATELET -  Abnormal; Notable for the following components:   RBC 3.79 (*)    Hemoglobin 11.7 (*)    All other components within normal limits  BRAIN NATRIURETIC PEPTIDE - Abnormal; Notable for the following components:   B Natriuretic Peptide 160.2 (*)    All other components within normal limits  TROPONIN I (HIGH SENSITIVITY)  TROPONIN I (HIGH SENSITIVITY)    EKG EKG Interpretation  Date/Time:  Monday May 02 2019 18:56:54  EST Ventricular Rate:  97 PR Interval:    QRS Duration: 155 QT Interval:  431 QTC Calculation: 548 R Axis:   -63 Text Interpretation: ventricular-paced complexes Artifact in lead(s) I aVR V1 V2 and baseline wander in lead(s) II Confirmed by Kristine RoyalMessick, Shaunika Italiano [phone] on 05/02/2019 8:16:41 PM   Radiology DG Pelvis 1-2 Views  Result Date: 05/02/2019 CLINICAL DATA:  Pain following fall EXAM: PELVIS - 1-2 VIEW COMPARISON:  June 04, 2018 FINDINGS: There is chronic ankylosis of the right hip joint which appear stable. No acute fracture or dislocation evident. There is moderate narrowing of the left hip joint, stable. No erosive change. There are foci of arterial vascular calcification which appear stable. IMPRESSION: No acute fracture or dislocation. 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 Moderate narrowing left hip joint, stable. Foci of arterial vascular calcification noted. Electronically Signed   By: Bretta BangWilliam  Woodruff III M.D.   On: 05/02/2019 19:53   CT Head Wo Contrast  Result Date: 05/02/2019 CLINICAL DATA:  Unwitnessed fall.  Dementia EXAM: CT HEAD WITHOUT CONTRAST CT CERVICAL SPINE WITHOUT CONTRAST TECHNIQUE: Multidetector CT imaging of the head and cervical spine was performed following the standard protocol without intravenous contrast. Multiplanar CT image reconstructions of the cervical spine were also generated. COMPARISON:  None. FINDINGS: CT HEAD FINDINGS Brain: No acute intracranial hemorrhage. No focal mass lesion. No CT evidence of acute infarction. No midline shift or mass effect. No hydrocephalus. Basilar cisterns are patent. Remote RIGHT occipital infarction. There are periventricular and subcortical white matter hypodensities. Generalized cortical atrophy. Vascular: No hyperdense vessel or unexpected calcification. Skull: Normal. Negative for fracture or focal lesion. Sinuses/Orbits: Paranasal sinuses and mastoid air cells are clear. Orbits are clear. Other: None. CT  CERVICAL SPINE FINDINGS Alignment: Anterolisthesis of C3 on C4 described below. Straightening of the normal cervical lordosis. Skull base and vertebrae: Normal craniocervical junction. 5 mm of anterolisthesis of C3 on C4 is similar to 4 mm on 06/04/2018. There is loss of joint space at C3-C4 as well as sclerosis of the vertebral bodies along the subluxation. Mild anterolisthesis by 2 mm of C7 on T1. No acute loss vertebral by height or disc height.  There is Disc levels:  Multilevel facet hypertrophy. Upper chest: Biapical partially calcified pleuroparenchymal thickening. Other: None IMPRESSION: 1. No acute intracranial findings. 2. Remote RIGHT occipital infarction. 3. No evidence of acute cervical spine fracture. 4. Progressive anterolisthesis of C3 on C4 is similar to CT from 06/04/2018. 5. Multilevel bulky facet hypertrophy. Electronically Signed   By: Genevive BiStewart  Edmunds M.D.   On: 05/02/2019 20:01   CT Cervical Spine Wo Contrast  Result Date: 05/02/2019 CLINICAL DATA:  Unwitnessed fall.  Dementia EXAM: CT HEAD WITHOUT CONTRAST CT CERVICAL SPINE WITHOUT CONTRAST TECHNIQUE: Multidetector CT imaging of the head and cervical spine was performed following the standard protocol without intravenous contrast. Multiplanar CT image reconstructions of the cervical spine were also generated. COMPARISON:  None. FINDINGS: CT HEAD FINDINGS Brain: No acute intracranial hemorrhage. No focal mass lesion. No CT evidence of acute  infarction. No midline shift or mass effect. No hydrocephalus. Basilar cisterns are patent. Remote RIGHT occipital infarction. There are periventricular and subcortical white matter hypodensities. Generalized cortical atrophy. Vascular: No hyperdense vessel or unexpected calcification. Skull: Normal. Negative for fracture or focal lesion. Sinuses/Orbits: Paranasal sinuses and mastoid air cells are clear. Orbits are clear. Other: None. CT CERVICAL SPINE FINDINGS Alignment: Anterolisthesis of C3 on C4  described below. Straightening of the normal cervical lordosis. Skull base and vertebrae: Normal craniocervical junction. 5 mm of anterolisthesis of C3 on C4 is similar to 4 mm on 06/04/2018. There is loss of joint space at C3-C4 as well as sclerosis of the vertebral bodies along the subluxation. Mild anterolisthesis by 2 mm of C7 on T1. No acute loss vertebral by height or disc height.  There is Disc levels:  Multilevel facet hypertrophy. Upper chest: Biapical partially calcified pleuroparenchymal thickening. Other: None IMPRESSION: 1. No acute intracranial findings. 2. Remote RIGHT occipital infarction. 3. No evidence of acute cervical spine fracture. 4. Progressive anterolisthesis of C3 on C4 is similar to CT from 06/04/2018. 5. Multilevel bulky facet hypertrophy. Electronically Signed   By: Genevive Bi M.D.   On: 05/02/2019 20:01   DG Chest Port 1 View  Result Date: 05/02/2019 CLINICAL DATA:  Pain following fall EXAM: PORTABLE CHEST 1 VIEW COMPARISON:  June 04, 2018 FINDINGS: The lungs are clear. Heart size and pulmonary vascularity are normal. Pacemaker leads are attached to the right atrium and right ventricle. There is aortic atherosclerosis. No pneumothorax. No bone lesions. IMPRESSION: Lungs clear. Stable cardiac silhouette. Pacemaker leads attached to right atrium and right ventricle. Aortic Atherosclerosis (ICD10-I70.0). Electronically Signed   By: Bretta Bang III M.D.   On: 05/02/2019 19:54    Procedures Procedures (including critical care time)  Medications Ordered in ED Medications - No data to display  ED Course  I have reviewed the triage vital signs and the nursing notes.  Pertinent labs & imaging results that were available during my care of the patient were reviewed by me and considered in my medical decision making (see chart for details).    MDM Rules/Calculators/A&P                      MDM  Screen complete  Kawanna Benzing was evaluated in Emergency  Department on 05/02/2019 for the symptoms described in the history of present illness. She was evaluated in the context of the global COVID-19 pandemic, which necessitated consideration that the patient might be at risk for infection with the SARS-CoV-2 virus that causes COVID-19. Institutional protocols and algorithms that pertain to the evaluation of patients at risk for COVID-19 are in a state of rapid change based on information released by regulatory bodies including the CDC and federal and state organizations. These policies and algorithms were followed during the patient's care in the ED.  Patient is presenting post unwitnessed fall.   She is a DNR.   She was recently diagnosed with COVID - timeframe is uncertain.   Screening labs and imaging do reveal acute UTI.   Patient likely would benefit from admission for further treatment.   Hospitalist service is aware of case and will evaluate for admission.      Final Clinical Impression(s) / ED Diagnoses Final diagnoses:  COVID-19  Urinary tract infection without hematuria, site unspecified    Rx / DC Orders ED Discharge Orders    None       Wynetta Fines, MD 05/02/19 2309

## 2019-05-03 ENCOUNTER — Other Ambulatory Visit: Payer: Self-pay

## 2019-05-03 DIAGNOSIS — E039 Hypothyroidism, unspecified: Secondary | ICD-10-CM

## 2019-05-03 DIAGNOSIS — R0902 Hypoxemia: Secondary | ICD-10-CM | POA: Diagnosis present

## 2019-05-03 DIAGNOSIS — Z7951 Long term (current) use of inhaled steroids: Secondary | ICD-10-CM | POA: Diagnosis not present

## 2019-05-03 DIAGNOSIS — Z880 Allergy status to penicillin: Secondary | ICD-10-CM | POA: Diagnosis not present

## 2019-05-03 DIAGNOSIS — Z79890 Hormone replacement therapy (postmenopausal): Secondary | ICD-10-CM | POA: Diagnosis not present

## 2019-05-03 DIAGNOSIS — Z950 Presence of cardiac pacemaker: Secondary | ICD-10-CM | POA: Diagnosis not present

## 2019-05-03 DIAGNOSIS — J449 Chronic obstructive pulmonary disease, unspecified: Secondary | ICD-10-CM | POA: Diagnosis present

## 2019-05-03 DIAGNOSIS — F0390 Unspecified dementia without behavioral disturbance: Secondary | ICD-10-CM | POA: Diagnosis present

## 2019-05-03 DIAGNOSIS — Z887 Allergy status to serum and vaccine status: Secondary | ICD-10-CM | POA: Diagnosis not present

## 2019-05-03 DIAGNOSIS — I509 Heart failure, unspecified: Secondary | ICD-10-CM | POA: Diagnosis present

## 2019-05-03 DIAGNOSIS — I10 Essential (primary) hypertension: Secondary | ICD-10-CM

## 2019-05-03 DIAGNOSIS — Z9049 Acquired absence of other specified parts of digestive tract: Secondary | ICD-10-CM | POA: Diagnosis not present

## 2019-05-03 DIAGNOSIS — F329 Major depressive disorder, single episode, unspecified: Secondary | ICD-10-CM | POA: Diagnosis present

## 2019-05-03 DIAGNOSIS — N390 Urinary tract infection, site not specified: Secondary | ICD-10-CM | POA: Diagnosis present

## 2019-05-03 DIAGNOSIS — E875 Hyperkalemia: Secondary | ICD-10-CM | POA: Diagnosis present

## 2019-05-03 DIAGNOSIS — U071 COVID-19: Secondary | ICD-10-CM | POA: Diagnosis present

## 2019-05-03 DIAGNOSIS — Z8249 Family history of ischemic heart disease and other diseases of the circulatory system: Secondary | ICD-10-CM | POA: Diagnosis not present

## 2019-05-03 DIAGNOSIS — E119 Type 2 diabetes mellitus without complications: Secondary | ICD-10-CM | POA: Diagnosis present

## 2019-05-03 DIAGNOSIS — Z8701 Personal history of pneumonia (recurrent): Secondary | ICD-10-CM | POA: Diagnosis not present

## 2019-05-03 DIAGNOSIS — Z66 Do not resuscitate: Secondary | ICD-10-CM | POA: Diagnosis present

## 2019-05-03 DIAGNOSIS — Y92099 Unspecified place in other non-institutional residence as the place of occurrence of the external cause: Secondary | ICD-10-CM | POA: Diagnosis not present

## 2019-05-03 DIAGNOSIS — W19XXXA Unspecified fall, initial encounter: Secondary | ICD-10-CM | POA: Diagnosis present

## 2019-05-03 DIAGNOSIS — M069 Rheumatoid arthritis, unspecified: Secondary | ICD-10-CM | POA: Diagnosis present

## 2019-05-03 DIAGNOSIS — K219 Gastro-esophageal reflux disease without esophagitis: Secondary | ICD-10-CM | POA: Diagnosis present

## 2019-05-03 DIAGNOSIS — Z882 Allergy status to sulfonamides status: Secondary | ICD-10-CM | POA: Diagnosis not present

## 2019-05-03 DIAGNOSIS — I110 Hypertensive heart disease with heart failure: Secondary | ICD-10-CM | POA: Diagnosis present

## 2019-05-03 DIAGNOSIS — Y92129 Unspecified place in nursing home as the place of occurrence of the external cause: Secondary | ICD-10-CM

## 2019-05-03 DIAGNOSIS — Z90710 Acquired absence of both cervix and uterus: Secondary | ICD-10-CM | POA: Diagnosis not present

## 2019-05-03 DIAGNOSIS — Z888 Allergy status to other drugs, medicaments and biological substances status: Secondary | ICD-10-CM | POA: Diagnosis not present

## 2019-05-03 LAB — COMPREHENSIVE METABOLIC PANEL
ALT: 12 U/L (ref 0–44)
AST: 16 U/L (ref 15–41)
Albumin: 2.8 g/dL — ABNORMAL LOW (ref 3.5–5.0)
Alkaline Phosphatase: 64 U/L (ref 38–126)
Anion gap: 8 (ref 5–15)
BUN: 21 mg/dL (ref 8–23)
CO2: 26 mmol/L (ref 22–32)
Calcium: 8.3 mg/dL — ABNORMAL LOW (ref 8.9–10.3)
Chloride: 108 mmol/L (ref 98–111)
Creatinine, Ser: 0.62 mg/dL (ref 0.44–1.00)
GFR calc Af Amer: >60 mL/min (ref >60)
GFR calc non Af Amer: >60 mL/min (ref >60)
Glucose, Bld: 92 mg/dL (ref 70–99)
Potassium: 3.8 mmol/L (ref 3.5–5.1)
Sodium: 142 mmol/L (ref 135–145)
Total Bilirubin: 0.5 mg/dL (ref 0.3–1.2)
Total Protein: 5.5 g/dL — ABNORMAL LOW (ref 6.5–8.1)

## 2019-05-03 LAB — GLUCOSE, CAPILLARY: Glucose-Capillary: 80 mg/dL (ref 70–99)

## 2019-05-03 LAB — D-DIMER, QUANTITATIVE: D-Dimer, Quant: 1.1 ug/mL-FEU — ABNORMAL HIGH (ref 0.00–0.50)

## 2019-05-03 LAB — CK: Total CK: 57 U/L (ref 38–234)

## 2019-05-03 LAB — CBC
HCT: 33.5 % — ABNORMAL LOW (ref 36.0–46.0)
Hemoglobin: 10.7 g/dL — ABNORMAL LOW (ref 12.0–15.0)
MCH: 30.3 pg (ref 26.0–34.0)
MCHC: 31.9 g/dL (ref 30.0–36.0)
MCV: 94.9 fL (ref 80.0–100.0)
Platelets: 223 K/uL (ref 150–400)
RBC: 3.53 MIL/uL — ABNORMAL LOW (ref 3.87–5.11)
RDW: 12.2 % (ref 11.5–15.5)
WBC: 5.1 K/uL (ref 4.0–10.5)
nRBC: 0 % (ref 0.0–0.2)

## 2019-05-03 LAB — C-REACTIVE PROTEIN: CRP: 0.8 mg/dL (ref <1.0)

## 2019-05-03 MED ORDER — ASCORBIC ACID 500 MG PO TABS
500.0000 mg | ORAL_TABLET | Freq: Every day | ORAL | Status: DC
  Administered 2019-05-03 – 2019-05-05 (×3): 500 mg via ORAL
  Filled 2019-05-03 (×3): qty 1

## 2019-05-03 MED ORDER — INFLUENZA VAC A&B SA ADJ QUAD 0.5 ML IM PRSY
0.5000 mL | PREFILLED_SYRINGE | INTRAMUSCULAR | Status: DC
  Filled 2019-05-03: qty 0.5

## 2019-05-03 MED ORDER — SODIUM CHLORIDE 0.9 % IV SOLN: INTRAVENOUS | Status: AC

## 2019-05-03 MED ORDER — ENOXAPARIN SODIUM 40 MG/0.4ML ~~LOC~~ SOLN
40.0000 mg | Freq: Every day | SUBCUTANEOUS | Status: DC
  Administered 2019-05-03 – 2019-05-04 (×3): 40 mg via SUBCUTANEOUS
  Filled 2019-05-03 (×3): qty 0.4

## 2019-05-03 MED ORDER — CALCIUM CARBONATE-VITAMIN D 500-200 MG-UNIT PO TABS
1.0000 | ORAL_TABLET | Freq: Every day | ORAL | Status: DC
  Administered 2019-05-03 – 2019-05-05 (×3): 1 via ORAL
  Filled 2019-05-03 (×4): qty 1

## 2019-05-03 MED ORDER — PSYLLIUM 95 % PO PACK
1.0000 | PACK | Freq: Every day | ORAL | Status: DC
  Administered 2019-05-03 – 2019-05-04 (×2): 1 via ORAL
  Filled 2019-05-03 (×4): qty 1

## 2019-05-03 MED ORDER — ARIPIPRAZOLE 1 MG/ML PO SOLN: 2.0000 mg | Freq: Every day | ORAL | Status: DC

## 2019-05-03 MED ORDER — DILTIAZEM HCL 60 MG PO TABS
60.0000 mg | ORAL_TABLET | Freq: Two times a day (BID) | ORAL | Status: DC
  Administered 2019-05-03 – 2019-05-05 (×5): 60 mg via ORAL
  Filled 2019-05-03 (×6): qty 1

## 2019-05-03 MED ORDER — POLYETHYLENE GLYCOL 3350 17 G PO PACK: 17.0000 g | PACK | Freq: Every day | ORAL | Status: DC | PRN

## 2019-05-03 MED ORDER — LOSARTAN POTASSIUM 25 MG PO TABS
25.0000 mg | ORAL_TABLET | Freq: Every day | ORAL | Status: DC
  Administered 2019-05-03 – 2019-05-05 (×3): 25 mg via ORAL
  Filled 2019-05-03 (×4): qty 1

## 2019-05-03 MED ORDER — BUSPIRONE HCL 5 MG PO TABS
5.0000 mg | ORAL_TABLET | Freq: Two times a day (BID) | ORAL | Status: DC | PRN
  Administered 2019-05-05: 09:00:00 5 mg via ORAL
  Filled 2019-05-03 (×2): qty 1

## 2019-05-03 MED ORDER — DOCUSATE SODIUM 100 MG PO CAPS
100.0000 mg | ORAL_CAPSULE | ORAL | Status: DC
  Administered 2019-05-04: 10:00:00 100 mg via ORAL
  Filled 2019-05-03: qty 1

## 2019-05-03 MED ORDER — SODIUM CHLORIDE 0.9 % IV SOLN
1.0000 g | INTRAVENOUS | Status: DC
  Administered 2019-05-03 – 2019-05-04 (×2): 1 g via INTRAVENOUS
  Filled 2019-05-03 (×2): qty 10

## 2019-05-03 MED ORDER — GUAIFENESIN-DM 100-10 MG/5ML PO SYRP: 10.0000 mL | ORAL_SOLUTION | ORAL | Status: DC | PRN

## 2019-05-03 MED ORDER — PANTOPRAZOLE SODIUM 20 MG PO TBEC
20.0000 mg | DELAYED_RELEASE_TABLET | Freq: Every day | ORAL | Status: DC
  Administered 2019-05-03 – 2019-05-05 (×3): 20 mg via ORAL
  Filled 2019-05-03 (×3): qty 1

## 2019-05-03 MED ORDER — MELATONIN 3 MG PO TABS
3.0000 mg | ORAL_TABLET | Freq: Every day | ORAL | Status: DC
  Administered 2019-05-03 – 2019-05-04 (×3): 3 mg via ORAL
  Filled 2019-05-03 (×4): qty 1

## 2019-05-03 MED ORDER — MOMETASONE FURO-FORMOTEROL FUM 200-5 MCG/ACT IN AERO: 2.0000 | INHALATION_SPRAY | Freq: Two times a day (BID) | RESPIRATORY_TRACT | Status: DC

## 2019-05-03 MED ORDER — HYDROCOD POLST-CPM POLST ER 10-8 MG/5ML PO SUER: 5.0000 mL | Freq: Two times a day (BID) | ORAL | Status: DC | PRN

## 2019-05-03 MED ORDER — LIDOCAINE 5 % EX PTCH
1.0000 | MEDICATED_PATCH | Freq: Every morning | CUTANEOUS | Status: DC
  Administered 2019-05-03 – 2019-05-05 (×3): 1 via TRANSDERMAL
  Filled 2019-05-03 (×3): qty 1

## 2019-05-03 MED ORDER — ESCITALOPRAM OXALATE 5 MG PO TABS
15.0000 mg | ORAL_TABLET | Freq: Every day | ORAL | Status: DC
  Administered 2019-05-03 – 2019-05-05 (×3): 15 mg via ORAL
  Filled 2019-05-03 (×2): qty 1
  Filled 2019-05-03: qty 2

## 2019-05-03 MED ORDER — MIRTAZAPINE 15 MG PO TABS
15.0000 mg | ORAL_TABLET | Freq: Every day | ORAL | Status: DC
  Administered 2019-05-03 – 2019-05-04 (×3): 15 mg via ORAL
  Filled 2019-05-03 (×2): qty 1
  Filled 2019-05-03: qty 2

## 2019-05-03 MED ORDER — ACETAMINOPHEN 325 MG PO TABS: 650.0000 mg | ORAL_TABLET | Freq: Four times a day (QID) | ORAL | Status: DC | PRN

## 2019-05-03 MED ORDER — LEVOTHYROXINE SODIUM 88 MCG PO TABS
88.0000 ug | ORAL_TABLET | Freq: Every day | ORAL | Status: DC
  Administered 2019-05-03 – 2019-05-04 (×3): 88 ug via ORAL
  Filled 2019-05-03 (×4): qty 1

## 2019-05-03 MED ORDER — ZINC SULFATE 220 (50 ZN) MG PO CAPS
220.0000 mg | ORAL_CAPSULE | Freq: Every day | ORAL | Status: DC
  Administered 2019-05-03 – 2019-05-05 (×3): 220 mg via ORAL
  Filled 2019-05-03 (×3): qty 1

## 2019-05-03 MED ORDER — SODIUM CHLORIDE 1 G PO TABS
1.0000 g | ORAL_TABLET | Freq: Every day | ORAL | Status: DC
  Administered 2019-05-03 – 2019-05-05 (×3): 1 g via ORAL
  Filled 2019-05-03 (×3): qty 1

## 2019-05-03 MED ORDER — LORATADINE 10 MG PO TABS
10.0000 mg | ORAL_TABLET | Freq: Every day | ORAL | Status: DC
  Administered 2019-05-03 – 2019-05-05 (×3): 10 mg via ORAL
  Filled 2019-05-03 (×4): qty 1

## 2019-05-03 MED ORDER — ALBUTEROL SULFATE HFA 108 (90 BASE) MCG/ACT IN AERS
2.0000 | INHALATION_SPRAY | Freq: Four times a day (QID) | RESPIRATORY_TRACT | Status: DC | PRN
  Administered 2019-05-04: 2 via RESPIRATORY_TRACT
  Filled 2019-05-03: qty 6.7

## 2019-05-03 NOTE — ED Notes (Addendum)
[facility] called for transport of patient, papers at nurse's station. Nursing home updated on patient's status. Attempted to call report to [facility].

## 2019-05-03 NOTE — Progress Notes (Signed)
PROGRESS NOTE  Kathy Peterson GMW:[id number] DOB: 02-Dec-1926 DOA: 05/02/2019 PCP: [name], MD  HPI/Recap of past 24 hours: HPI from Dr Gloriajean Dell [place] Harder is a 85 y.o. female with medical history significant of dementia, hypertension, hypothyroidism, COPD, status post pacemaker presented from SNF due to an unwitnessed fall.  History was obtained from review of medical records. In the ED, pt remained hemodynamically stable and afebrile with a respiratory of 26 and saturating 100% on 2 L/min. Pt tested positive for SARS-CoV-2 and chest radiograph was negative for any acute infiltrate.  Urinalysis was suggestive of urinary tract infection.  Patient admitted for further management.    Today, patient continues to deny any new complaints.  Looked comfortable, not in any respiratory distress.  Assessment/Plan: Active Problems:   COVID-19 virus infection  COVID-19 infection Currently afebrile, with no leukocytosis Was placed on 2 L of O2 in the ED, saturating 100%, plan to wean off Inflammatory markers unremarkable Chest x-ray unremarkable No need for steroids or remdesivir for now Continue to monitor closely, trend inflammatory markers Transferred to [facility]  Possible UTI UA with positive nitrites, mod leukocytes, many bacteria, greater than 50 WBC UC pending Continue IV ceftriaxone pending cultures  Unwitnessed fall ??  Mechanical CT head/ C-spine with no acute intracranial findings/cervical spine fracture X-ray pelvis with no acute fracture or dislocation  Hypertension Continue home diltiazem, losartan  Hypothyroidism Continue Synthroid  GERD Continue PPI  Dementia/depression Continue Remeron, Lexapro, BuSpar         Malnutrition Type:      Malnutrition Characteristics:      Nutrition Interventions:       Estimated body mass index is 25.34 kg/m as calculated from the following:   Height as of 12/14/16: 5\' 6"  (1.676 m).   Weight as of 12/17/16: 71.2 kg.       Code Status: DNR  Family Communication: None at bedside  Disposition Plan: To be determined, likely back to SNF   Consultants:  None  Procedures:  None  Antimicrobials:  Ceftriaxone  DVT prophylaxis: Lovenox   Objective: Vitals:   05/03/19 0930 05/03/19 1030 05/03/19 1100 05/03/19 1130  BP: 135/60 138/70 136/64 (!) 141/66  Pulse: 73 77 74 77  Resp: (!) 24 20 15 16   Temp:      SpO2: 100% 100% 100% 99%    Intake/Output Summary (Last 24 hours) at 05/03/2019 1240 Last data filed at 05/03/2019 1053 Gross per 24 hour  Intake 697.45 ml  Output --  Net 697.45 ml   There were no vitals filed for this visit.  Exam:  General: NAD, not oriented, but calm  Cardiovascular: S1, S2 present  Respiratory: CTAB  Abdomen: Soft, nontender, nondistended, bowel sounds present  Musculoskeletal: No bilateral pedal edema noted, moves all extremities equally  Skin: Normal  Psychiatry:  Poor insight    Data Reviewed: CBC: Recent Labs  Lab 05/02/19 2003 05/03/19 0600  WBC 7.1 5.1  NEUTROABS 5.0  --   HGB 11.7* 10.7*  HCT 36.1 33.5*  MCV 95.3 94.9  PLT 245 223   Basic Metabolic Panel: Recent Labs  Lab 05/02/19 2003 05/03/19 0823  NA 138 142  K 5.3* 3.8  CL 104 108  CO2 26 26  GLUCOSE 119* 92  BUN 27* 21  CREATININE 0.81 0.62  CALCIUM 8.8* 8.3*   GFR: CrCl cannot be calculated (Unknown ideal weight.). Liver Function Tests: Recent Labs  Lab 05/02/19 2003 05/03/19 0823  AST 23 16  ALT 12 12  ALKPHOS 66 64  BILITOT 0.9 0.5  PROT 6.3* 5.5*  ALBUMIN 3.3* 2.8*   No results for input(s): LIPASE, AMYLASE in the last 168 hours. No results for input(s): AMMONIA in the last 168 hours. Coagulation Profile: No results for input(s): INR, PROTIME in the last 168 hours. Cardiac Enzymes: Recent Labs  Lab 05/03/19 0009  CKTOTAL 57   BNP (last 3 results) No results for input(s): PROBNP in the last 8760 hours. HbA1C: No results for input(s):  HGBA1C in the last 72 hours. CBG: No results for input(s): GLUCAP in the last 168 hours. Lipid Profile: No results for input(s): CHOL, HDL, LDLCALC, TRIG, CHOLHDL, LDLDIRECT in the last 72 hours. Thyroid Function Tests: No results for input(s): TSH, T4TOTAL, FREET4, T3FREE, THYROIDAB in the last 72 hours. Anemia Panel: No results for input(s): VITAMINB12, FOLATE, FERRITIN, TIBC, IRON, RETICCTPCT in the last 72 hours. Urine analysis:    Component Value Date/Time   COLORURINE AMBER (A) 05/02/2019 1907   APPEARANCEUR CLOUDY (A) 05/02/2019 1907   LABSPEC 1.026 05/02/2019 1907   PHURINE 5.0 05/02/2019 1907   GLUCOSEU NEGATIVE 05/02/2019 1907   HGBUR NEGATIVE 05/02/2019 1907   BILIRUBINUR NEGATIVE 05/02/2019 1907   KETONESUR 5 (A) 05/02/2019 1907   PROTEINUR 100 (A) 05/02/2019 1907   NITRITE POSITIVE (A) 05/02/2019 1907   LEUKOCYTESUR MODERATE (A) 05/02/2019 1907   Sepsis Labs: @LABRCNTIP (procalcitonin:4,lacticidven:4)  ) Recent Results (from the past 240 hour(s))  Respiratory Panel by RT PCR (Flu A&B, Covid) - Nasopharyngeal Swab     Status: Abnormal   Collection Time: 05/02/19  8:03 PM   Specimen: Nasopharyngeal Swab  Result Value Ref Range Status   SARS Coronavirus 2 by RT PCR POSITIVE (A) NEGATIVE Final    Comment: CRITICAL RESULT CALLED TO, READ BACK BY AND VERIFIED WITH: rn z teeters at 2112 05/02/19 cruickshank a (NOTE) SARS-CoV-2 target nucleic acids are DETECTED. SARS-CoV-2 RNA is generally detectable in upper respiratory specimens  during the acute phase of infection. Positive results are indicative of the presence of the identified virus, but do not rule out bacterial infection or co-infection with other pathogens not detected by the test. Clinical correlation with patient history and other diagnostic information is necessary to determine patient infection status. The expected result is Negative. Fact Sheet for Patients:   https://www.moore.com/https://www.fda.gov/media/142436/download Fact Sheet for Healthcare Providers: https://www.young.biz/https://www.fda.gov/media/142435/download This test is not yet approved or cleared by the [place] FDA and  has been authorized for detection and/or diagnosis of SARS-CoV-2 by FDA under an Emergency Use Authorization (EUA).  This EUA will remain in effect (meaning this te st can be used) for the duration of  the COVID-19 declaration under Section 564(b)(1) of the Act, 21 U.S.C. section 360bbb-3(b)(1), unless the authorization is terminated or revoked sooner.    Influenza A by PCR NEGATIVE NEGATIVE Final   Influenza B by PCR NEGATIVE NEGATIVE Final    Comment: (NOTE) The Xpert Xpress SARS-CoV-2/FLU/RSV assay is intended as an aid in  the diagnosis of influenza from Nasopharyngeal swab specimens and  should not be used as a sole basis for treatment. Nasal washings and  aspirates are unacceptable for Xpert Xpress SARS-CoV-2/FLU/RSV  testing. Fact Sheet for Patients: https://www.moore.com/https://www.fda.gov/media/142436/download Fact Sheet for Healthcare Providers: https://www.young.biz/https://www.fda.gov/media/142435/download This test is not yet approved or cleared by the [place] FDA and  has been authorized for detection and/or diagnosis of SARS-CoV-2 by  FDA under an Emergency Use Authorization (EUA). This EUA will remain  in effect (meaning this test can be used)  for the duration of the  Covid-19 declaration under Section 564(b)(1) of the Act, 21  U.S.C. section 360bbb-3(b)(1), unless the authorization is  terminated or revoked. Performed at [facility], [place] [address], [place], [place] [address]       Studies: DG Pelvis 1-2 Views  Result Date: 05/02/2019 CLINICAL DATA:  Pain following fall EXAM: PELVIS - 1-2 VIEW COMPARISON:  June 04, 2018 FINDINGS: There is chronic ankylosis of the right hip joint which appear stable. No acute fracture or dislocation evident. There is moderate narrowing of the left hip  joint, stable. No erosive change. There are foci of arterial vascular calcification which appear stable. IMPRESSION: No acute fracture or dislocation. 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 Moderate narrowing left hip joint, stable. Foci of arterial vascular calcification noted. Electronically Signed   By: Lowella Grip III M.D.   On: 05/02/2019 19:53   CT Head Wo Contrast  Result Date: 05/02/2019 CLINICAL DATA:  Unwitnessed fall.  Dementia EXAM: CT HEAD WITHOUT CONTRAST CT CERVICAL SPINE WITHOUT CONTRAST TECHNIQUE: Multidetector CT imaging of the head and cervical spine was performed following the standard protocol without intravenous contrast. Multiplanar CT image reconstructions of the cervical spine were also generated. COMPARISON:  None. FINDINGS: CT HEAD FINDINGS Brain: No acute intracranial hemorrhage. No focal mass lesion. No CT evidence of acute infarction. No midline shift or mass effect. No hydrocephalus. Basilar cisterns are patent. Remote RIGHT occipital infarction. There are periventricular and subcortical white matter hypodensities. Generalized cortical atrophy. Vascular: No hyperdense vessel or unexpected calcification. Skull: Normal. Negative for fracture or focal lesion. Sinuses/Orbits: Paranasal sinuses and mastoid air cells are clear. Orbits are clear. Other: None. CT CERVICAL SPINE FINDINGS Alignment: Anterolisthesis of C3 on C4 described below. Straightening of the normal cervical lordosis. Skull base and vertebrae: Normal craniocervical junction. 5 mm of anterolisthesis of C3 on C4 is similar to 4 mm on 06/04/2018. There is loss of joint space at C3-C4 as well as sclerosis of the vertebral bodies along the subluxation. Mild anterolisthesis by 2 mm of C7 on T1. No acute loss vertebral by height or disc height.  There is Disc levels:  Multilevel facet hypertrophy. Upper chest: Biapical partially calcified pleuroparenchymal thickening. Other: None IMPRESSION: 1. No acute  intracranial findings. 2. Remote RIGHT occipital infarction. 3. No evidence of acute cervical spine fracture. 4. Progressive anterolisthesis of C3 on C4 is similar to CT from 06/04/2018. 5. Multilevel bulky facet hypertrophy. Electronically Signed   By: Suzy Bouchard M.D.   On: 05/02/2019 20:01   CT Cervical Spine Wo Contrast  Result Date: 05/02/2019 CLINICAL DATA:  Unwitnessed fall.  Dementia EXAM: CT HEAD WITHOUT CONTRAST CT CERVICAL SPINE WITHOUT CONTRAST TECHNIQUE: Multidetector CT imaging of the head and cervical spine was performed following the standard protocol without intravenous contrast. Multiplanar CT image reconstructions of the cervical spine were also generated. COMPARISON:  None. FINDINGS: CT HEAD FINDINGS Brain: No acute intracranial hemorrhage. No focal mass lesion. No CT evidence of acute infarction. No midline shift or mass effect. No hydrocephalus. Basilar cisterns are patent. Remote RIGHT occipital infarction. There are periventricular and subcortical white matter hypodensities. Generalized cortical atrophy. Vascular: No hyperdense vessel or unexpected calcification. Skull: Normal. Negative for fracture or focal lesion. Sinuses/Orbits: Paranasal sinuses and mastoid air cells are clear. Orbits are clear. Other: None. CT CERVICAL SPINE FINDINGS Alignment: Anterolisthesis of C3 on C4 described below. Straightening of the normal cervical lordosis. Skull base and vertebrae: Normal craniocervical junction. 5 mm  of anterolisthesis of C3 on C4 is similar to 4 mm on 06/04/2018. There is loss of joint space at C3-C4 as well as sclerosis of the vertebral bodies along the subluxation. Mild anterolisthesis by 2 mm of C7 on T1. No acute loss vertebral by height or disc height.  There is Disc levels:  Multilevel facet hypertrophy. Upper chest: Biapical partially calcified pleuroparenchymal thickening. Other: None IMPRESSION: 1. No acute intracranial findings. 2. Remote RIGHT occipital infarction. 3.  No evidence of acute cervical spine fracture. 4. Progressive anterolisthesis of C3 on C4 is similar to CT from 06/04/2018. 5. Multilevel bulky facet hypertrophy. Electronically Signed   By: Genevive Bi M.D.   On: 05/02/2019 20:01   DG Chest Port 1 View  Result Date: 05/02/2019 CLINICAL DATA:  Pain following fall EXAM: PORTABLE CHEST 1 VIEW COMPARISON:  June 04, 2018 FINDINGS: The lungs are clear. Heart size and pulmonary vascularity are normal. Pacemaker leads are attached to the right atrium and right ventricle. There is aortic atherosclerosis. No pneumothorax. No bone lesions. IMPRESSION: Lungs clear. Stable cardiac silhouette. Pacemaker leads attached to right atrium and right ventricle. Aortic Atherosclerosis (ICD10-I70.0). Electronically Signed   By: Bretta Bang III M.D.   On: 05/02/2019 19:54    Scheduled Meds: . vitamin C  500 mg Oral Daily  . calcium-vitamin D  1 tablet Oral Q breakfast  . diltiazem  60 mg Oral BID  . [START ON 05/04/2019] docusate sodium  100 mg Oral Q M,W,F  . enoxaparin (LOVENOX) injection  40 mg Subcutaneous QHS  . escitalopram  15 mg Oral Daily  . levothyroxine  88 mcg Oral QHS  . lidocaine  1 patch Transdermal q morning - 10a  . loratadine  10 mg Oral Q breakfast  . losartan  25 mg Oral Q breakfast  . Melatonin  3 mg Oral QHS  . mirtazapine  15 mg Oral QHS  . pantoprazole  20 mg Oral Q breakfast  . psyllium  1 packet Oral QHS  . sodium chloride  1 g Oral Daily  . zinc sulfate  220 mg Oral Daily    Continuous Infusions: . cefTRIAXone (ROCEPHIN)  IV       LOS: 0 days     Briant Cedar, MD [facility]  If 7PM-7AM, please contact night-coverage [email or web address] 05/03/2019, 12:40 PM

## 2019-05-03 NOTE — Plan of Care (Signed)
Transfer from [facility] ED, placed in covid 19 negative pressure room on a low bed due to history of falls including yesterday's unwitnessed fall. SpO2 99% on 1 liter [place], will wean accordingly. Bed alarm activated, matts placed on floor. Pt with advanced dementia, oriented to name only. Will reposition every 2 hrs, small stage ll of right buttock, mepilex placed. Pur wick for frequent urine incontinence. All procedures explained to pt without acknowledgement. DNR status maintained.

## 2019-05-03 NOTE — Progress Notes (Signed)
Unanswered admission questionnaire also obtained by pt's health care power of attorney, Dorcas Mcmurray, daughter in law.

## 2019-05-04 LAB — CBC WITH DIFFERENTIAL/PLATELET
Abs Immature Granulocytes: 0.04 10*3/uL (ref 0.00–0.07)
Basophils Absolute: 0 10*3/uL (ref 0.0–0.1)
Basophils Relative: 1 %
Eosinophils Absolute: 0.1 10*3/uL (ref 0.0–0.5)
Eosinophils Relative: 2 %
HCT: 33.3 % — ABNORMAL LOW (ref 36.0–46.0)
Hemoglobin: 10.7 g/dL — ABNORMAL LOW (ref 12.0–15.0)
Immature Granulocytes: 1 %
Lymphocytes Relative: 24 %
Lymphs Abs: 1.3 10*3/uL (ref 0.7–4.0)
MCH: 30.5 pg (ref 26.0–34.0)
MCHC: 32.1 g/dL (ref 30.0–36.0)
MCV: 94.9 fL (ref 80.0–100.0)
Monocytes Absolute: 0.6 10*3/uL (ref 0.1–1.0)
Monocytes Relative: 11 %
Neutro Abs: 3.3 10*3/uL (ref 1.7–7.7)
Neutrophils Relative %: 61 %
Platelets: 232 10*3/uL (ref 150–400)
RBC: 3.51 MIL/uL — ABNORMAL LOW (ref 3.87–5.11)
RDW: 11.9 % (ref 11.5–15.5)
WBC: 5.4 10*3/uL (ref 4.0–10.5)
nRBC: 0 % (ref 0.0–0.2)

## 2019-05-04 LAB — FERRITIN: Ferritin: 161 ng/mL (ref 11–307)

## 2019-05-04 LAB — COMPREHENSIVE METABOLIC PANEL
ALT: 13 U/L (ref 0–44)
AST: 16 U/L (ref 15–41)
Albumin: 3.2 g/dL — ABNORMAL LOW (ref 3.5–5.0)
Alkaline Phosphatase: 63 U/L (ref 38–126)
Anion gap: 7 (ref 5–15)
BUN: 23 mg/dL (ref 8–23)
CO2: 28 mmol/L (ref 22–32)
Calcium: 9.1 mg/dL (ref 8.9–10.3)
Chloride: 108 mmol/L (ref 98–111)
Creatinine, Ser: 0.65 mg/dL (ref 0.44–1.00)
GFR calc Af Amer: >60 mL/min (ref >60)
GFR calc non Af Amer: >60 mL/min (ref >60)
Glucose, Bld: 95 mg/dL (ref 70–99)
Potassium: 4.3 mmol/L (ref 3.5–5.1)
Sodium: 143 mmol/L (ref 135–145)
Total Bilirubin: 0.7 mg/dL (ref 0.3–1.2)
Total Protein: 5.9 g/dL — ABNORMAL LOW (ref 6.5–8.1)

## 2019-05-04 LAB — GLUCOSE, CAPILLARY
Glucose-Capillary: 75 mg/dL (ref 70–99)
Glucose-Capillary: 81 mg/dL (ref 70–99)
Glucose-Capillary: 139 mg/dL — ABNORMAL HIGH (ref 70–99)

## 2019-05-04 LAB — D-DIMER, QUANTITATIVE: D-Dimer, Quant: 0.88 ug/mL-FEU — ABNORMAL HIGH (ref 0.00–0.50)

## 2019-05-04 LAB — PHOSPHORUS: Phosphorus: 3.4 mg/dL (ref 2.5–4.6)

## 2019-05-04 LAB — URINE CULTURE: Culture: NO GROWTH

## 2019-05-04 LAB — MAGNESIUM: Magnesium: 2 mg/dL (ref 1.7–2.4)

## 2019-05-04 LAB — C-REACTIVE PROTEIN: CRP: 1.1 mg/dL — ABNORMAL HIGH (ref <1.0)

## 2019-05-04 MED ORDER — CEPHALEXIN 500 MG PO CAPS: 500.0000 mg | ORAL_CAPSULE | Freq: Two times a day (BID) | ORAL | 0 refills | Status: AC

## 2019-05-04 NOTE — Discharge Summary (Signed)
Physician Discharge Summary  Kathy Peterson ERD:[id number] DOB: [birth year] DOA: 05/02/2019  PCP: [name], MD  Admit date: 05/02/2019 Discharge date: 05/04/2019  Admitted From: ALF Disposition: ALF  Recommendations for Outpatient Follow-up:  1. Follow up with PCP in 1-2 weeks 2. Please obtain BMP/CBC in one week  Discharge Condition: Guarded CODE STATUS: DNR Diet recommendation: As tolerated  Brief/Interim Summary: Kathy Peterson is a 85 y.o. female with medical history significant of dementia, hypertension, hypothyroidism and COPD.I should emphasize that, due to her dementia, she is unable to provide any reliable history.  I therefore obtain my history by review of electronic medical records. Patient was referred from her facility after an unwitnessed fall.  Unfortunately patient does not recollect circumstances leading to her fall.  She does not have any complaints or history at this time. At the ED, hemodynamically stable and afebrile with a respiratory of 26 and saturating 100% on 2 L/min.  She tested positive for SARS-CoV-2 and chest radiograph was negative for any acute infiltrate.  Urinalysis was suggestive of urinary tract infection.  Patient transferred to [facility] for further evaluation given Covid positive swabbing, patient remains without hypoxia, nonambulatory at baseline per family discussion she does have recurrent UTIs, will continue antibiotics at discharge back to assisted living facility for additional 48 hours for 3-day course.  Transiently hypoxic at intake but has remained on room air for over 48 hours at this point without hypoxia.  Transition back to assisted living facility to complete antibiotics, follow with PCP for routine evaluation and treatment as indicated.  Certainly should patient's respiratory status worsen, patient become hypoxic or dyspneic patient should report back to the ED for further evaluation and treatment.  Discharge Diagnoses:  Active Problems:   COVID-19  virus infection    Discharge Instructions   Allergies as of 05/04/2019      Reactions   Bactrim [sulfamethoxazole-trimethoprim] Other (See Comments)   Unknown reaction per [facility]    Penicillins Other (See Comments)   Unknown reaction per [facility]  Has patient had a PCN reaction causing immediate rash, facial/tongue/throat swelling, SOB or lightheadedness with hypotension: Unsure Has patient had a PCN reaction causing severe rash involving mucus membranes or skin necrosis: Unsure Has patient had a PCN reaction that required hospitalization Unsure Has patient had a PCN reaction occurring within the last 10 years: Unsure If all of the above answers are "NO", then may proceed with Cephalosporin use.   Pneumovax [pneumococcal Polysaccharide Vaccine] Other (See Comments)   Unknown reaction per [facility]    Statins Other (See Comments)   Unknown reaction per [facility]    Sulfa Antibiotics Other (See Comments)   Unknown reaction per [facility]       Medication List    TAKE these medications   acetaminophen 500 MG tablet Commonly known as: TYLENOL Take 500 mg by mouth 2 (two) times daily.   Advair HFA 115-21 MCG/ACT inhaler Generic drug: fluticasone-salmeterol Inhale 2 puffs into the lungs 2 (two) times daily.   albuterol 108 (90 Base) MCG/ACT inhaler Commonly known as: VENTOLIN HFA Inhale 2 puffs into the lungs every 6 (six) hours as needed for wheezing or shortness of breath.   Aripiprazole 1 MG/ML mL Take 2 mg by mouth daily.   busPIRone 5 MG tablet Commonly known as: BUSPAR Take 5 mg by mouth 2 (two) times daily as needed for anxiety.   Calcium 600+D 600-800 MG-UNIT Tabs Generic drug: Calcium Carb-Cholecalciferol Take 1 tablet by mouth daily with breakfast.   cephALEXin 500  MG capsule Commonly known as: Keflex Take 1 capsule (500 mg total) by mouth 2 (two) times daily for 2 days.   diltiazem 60 MG tablet Commonly known as: CARDIZEM Take 60 mg by mouth 2 (two) times daily.   docusate  sodium 100 MG capsule Commonly known as: COLACE Take 1 capsule (100 mg total) by mouth every Monday, Wednesday, and Friday.   escitalopram 10 MG tablet Commonly known as: LEXAPRO Take 15 mg by mouth daily.   feeding supplement (ENSURE ENLIVE) Liqd Take 237 mLs by mouth 2 (two) times daily between meals.   ferrous sulfate 325 (65 FE) MG tablet Take 325 mg by mouth every Monday, Wednesday, and Friday.   levothyroxine 88 MCG tablet Commonly known as: SYNTHROID Take 88 mcg by mouth at bedtime.   Lidocaine 4 % Ptch Apply 1 patch topically every morning. Remove every night. For right hip.   loratadine 10 MG tablet Commonly known as: CLARITIN Take 10 mg by mouth daily with breakfast.   losartan 25 MG tablet Commonly known as: COZAAR Take 25 mg by mouth daily with breakfast.   Melatonin 3 MG Tabs Take 3 mg by mouth at bedtime.   mirtazapine 15 MG tablet Commonly known as: REMERON Take 15 mg by mouth at bedtime.   OPTIVE 0.5-0.9 % ophthalmic solution Generic drug: carboxymethylcellul-glycerin Place 1 drop into both eyes every 12 (twelve) hours.   pantoprazole 20 MG tablet Commonly known as: PROTONIX Take 20 mg by mouth daily with breakfast.   psyllium 0.52 g capsule Commonly known as: REGULOID Take 0.52 g by mouth at bedtime.   Risamine 0.44-20.625 % Oint Generic drug: Menthol-Zinc Oxide Apply 1 application topically every 12 (twelve) hours. Apply to groin/buttocks topically every shift for redness   sodium chloride 1 g tablet Take 1 g by mouth daily.       Allergies  Allergen Reactions  . Bactrim [Sulfamethoxazole-Trimethoprim] Other (See Comments)    Unknown reaction per [facility]   . Penicillins Other (See Comments)    [facility]  Has patient had a PCN reaction causing immediate rash, facial/tongue/throat swelling, SOB or lightheadedness with hypotension: Unsure Has patient had a PCN reaction causing severe rash involving mucus membranes or skin  necrosis: Unsure Has patient had a PCN reaction that required hospitalization Unsure Has patient had a PCN reaction occurring within the last 10 years: Unsure If all of the above answers are "NO", then may proceed with Cephalosporin use.  . Pneumovax [Pneumococcal Polysaccharide Vaccine] Other (See Comments)    Unknown reaction per [facility]   . Statins Other (See Comments)    Unknown reaction per [facility]   . Sulfa Antibiotics Other (See Comments)    [facility]     Procedures/Studies: DG Pelvis 1-2 Views  Result Date: 05/02/2019 CLINICAL DATA:  Pain following fall EXAM: PELVIS - 1-2 VIEW COMPARISON:  June 04, 2018 FINDINGS: There is chronic ankylosis of the right hip joint which appear stable. No acute fracture or dislocation evident. There is moderate narrowing of the left hip joint, stable. No erosive change. There are foci of arterial vascular calcification which appear stable. IMPRESSION: No acute fracture or dislocation. 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 Moderate narrowing left hip joint, stable. Foci of arterial vascular calcification noted. Electronically Signed   By: Bretta BangWilliam  Woodruff III M.D.   On: 05/02/2019 19:53   CT Head Wo Contrast  Result Date: 05/02/2019 CLINICAL DATA:  Unwitnessed fall.  Dementia EXAM: CT HEAD  WITHOUT CONTRAST CT CERVICAL SPINE WITHOUT CONTRAST TECHNIQUE: Multidetector CT imaging of the head and cervical spine was performed following the standard protocol without intravenous contrast. Multiplanar CT image reconstructions of the cervical spine were also generated. COMPARISON:  None. FINDINGS: CT HEAD FINDINGS Brain: No acute intracranial hemorrhage. No focal mass lesion. No CT evidence of acute infarction. No midline shift or mass effect. No hydrocephalus. Basilar cisterns are patent. Remote RIGHT occipital infarction. There are periventricular and subcortical white matter hypodensities. Generalized cortical atrophy. Vascular: No hyperdense  vessel or unexpected calcification. Skull: Normal. Negative for fracture or focal lesion. Sinuses/Orbits: Paranasal sinuses and mastoid air cells are clear. Orbits are clear. Other: None. CT CERVICAL SPINE FINDINGS Alignment: Anterolisthesis of C3 on C4 described below. Straightening of the normal cervical lordosis. Skull base and vertebrae: Normal craniocervical junction. 5 mm of anterolisthesis of C3 on C4 is similar to 4 mm on 06/04/2018. There is loss of joint space at C3-C4 as well as sclerosis of the vertebral bodies along the subluxation. Mild anterolisthesis by 2 mm of C7 on T1. No acute loss vertebral by height or disc height.  There is Disc levels:  Multilevel facet hypertrophy. Upper chest: Biapical partially calcified pleuroparenchymal thickening. Other: None IMPRESSION: 1. No acute intracranial findings. 2. Remote RIGHT occipital infarction. 3. No evidence of acute cervical spine fracture. 4. Progressive anterolisthesis of C3 on C4 is similar to CT from 06/04/2018. 5. Multilevel bulky facet hypertrophy. Electronically Signed   By: Genevive BiStewart  Edmunds M.D.   On: 05/02/2019 20:01   CT Cervical Spine Wo Contrast  Result Date: 05/02/2019 CLINICAL DATA:  Unwitnessed fall.  Dementia EXAM: CT HEAD WITHOUT CONTRAST CT CERVICAL SPINE WITHOUT CONTRAST TECHNIQUE: Multidetector CT imaging of the head and cervical spine was performed following the standard protocol without intravenous contrast. Multiplanar CT image reconstructions of the cervical spine were also generated. COMPARISON:  None. FINDINGS: CT HEAD FINDINGS Brain: No acute intracranial hemorrhage. No focal mass lesion. No CT evidence of acute infarction. No midline shift or mass effect. No hydrocephalus. Basilar cisterns are patent. Remote RIGHT occipital infarction. There are periventricular and subcortical white matter hypodensities. Generalized cortical atrophy. Vascular: No hyperdense vessel or unexpected calcification. Skull: Normal. Negative for  fracture or focal lesion. Sinuses/Orbits: Paranasal sinuses and mastoid air cells are clear. Orbits are clear. Other: None. CT CERVICAL SPINE FINDINGS Alignment: Anterolisthesis of C3 on C4 described below. Straightening of the normal cervical lordosis. Skull base and vertebrae: Normal craniocervical junction. 5 mm of anterolisthesis of C3 on C4 is similar to 4 mm on 06/04/2018. There is loss of joint space at C3-C4 as well as sclerosis of the vertebral bodies along the subluxation. Mild anterolisthesis by 2 mm of C7 on T1. No acute loss vertebral by height or disc height.  There is Disc levels:  Multilevel facet hypertrophy. Upper chest: Biapical partially calcified pleuroparenchymal thickening. Other: None IMPRESSION: 1. No acute intracranial findings. 2. Remote RIGHT occipital infarction. 3. No evidence of acute cervical spine fracture. 4. Progressive anterolisthesis of C3 on C4 is similar to CT from 06/04/2018. 5. Multilevel bulky facet hypertrophy. Electronically Signed   By: Genevive BiStewart  Edmunds M.D.   On: 05/02/2019 20:01   DG Chest Port 1 View  Result Date: 05/02/2019 CLINICAL DATA:  Pain following fall EXAM: PORTABLE CHEST 1 VIEW COMPARISON:  June 04, 2018 FINDINGS: The lungs are clear. Heart size and pulmonary vascularity are normal. Pacemaker leads are attached to the right atrium and right ventricle. There is aortic atherosclerosis. No  pneumothorax. No bone lesions. IMPRESSION: Lungs clear. Stable cardiac silhouette. Pacemaker leads attached to right atrium and right ventricle. Aortic Atherosclerosis (ICD10-I70.0). Electronically Signed   By: Bretta BangWilliam  Woodruff III M.D.   On: 05/02/2019 19:54    Subjective: No acute issues or events overnight, tolerating p.o. well, declines any issues patient is poorly verbal at interview.   Discharge Exam: Vitals:   05/04/19 0829 05/04/19 1215  BP: (!) 147/63 126/68  Pulse: 86 80  Resp: 20 17  Temp: 98.2 F (36.8 C) 98.1 F (36.7 C)  SpO2: 96% 96%    Vitals:   05/03/19 1901 05/04/19 0329 05/04/19 0829 05/04/19 1215  BP: 139/64 131/64 (!) 147/63 126/68  Pulse: 93 71 86 80  Resp:   20 17  Temp: 98.4 F (36.9 C) 97.8 F (36.6 C) 98.2 F (36.8 C) 98.1 F (36.7 C)  TempSrc: Oral Oral Oral Oral  SpO2: 98% 98% 96% 96%  Height:        General:  Pleasantly resting in bed, No acute distress. HEENT:  Normocephalic atraumatic.  Sclerae nonicteric, noninjected.  Extraocular movements intact bilaterally. Neck:  Without mass or deformity.  Trachea is midline. Lungs:  Clear to auscultate bilaterally without rhonchi, wheeze, or rales. Heart:  Regular rate and rhythm.  Without murmurs, rubs, or gallops. Abdomen:  Soft, nontender, nondistended.  Without guarding or rebound. Extremities: Without cyanosis, clubbing, edema, or obvious deformity. Vascular:  Dorsalis pedis and posterior tibial pulses palpable bilaterally. Skin:  Warm and dry, no erythema, no ulcerations.   The results of significant diagnostics from this hospitalization (including imaging, microbiology, ancillary and laboratory) are listed below for reference.     Microbiology: Recent Results (from the past 240 hour(s))  Respiratory Panel by RT PCR (Flu A&B, Covid) - Nasopharyngeal Swab     Status: Abnormal   Collection Time: 05/02/19  8:03 PM   Specimen: Nasopharyngeal Swab  Result Value Ref Range Status   SARS Coronavirus 2 by RT PCR POSITIVE (A) NEGATIVE Final    Comment: CRITICAL RESULT CALLED TO, READ BACK BY AND VERIFIED WITH: rn z teeters at 2112 05/02/19 cruickshank a (NOTE) SARS-CoV-2 target nucleic acids are DETECTED. SARS-CoV-2 RNA is generally detectable in upper respiratory specimens  during the acute phase of infection. Positive results are indicative of the presence of the identified virus, but do not rule out bacterial infection or co-infection with other pathogens not detected by the test. Clinical correlation with patient history and other diagnostic  information is necessary to determine patient infection status. The expected result is Negative. Fact Sheet for Patients:  https://www.moore.com/https://www.fda.gov/media/142436/download Fact Sheet for Healthcare Providers: https://www.young.biz/https://www.fda.gov/media/142435/download This test is not yet approved or cleared by the [place] FDA and  has been authorized for detection and/or diagnosis of SARS-CoV-2 by FDA under an Emergency Use Authorization (EUA).  This EUA will remain in effect (meaning this te st can be used) for the duration of  the COVID-19 declaration under Section 564(b)(1) of the Act, 21 U.S.C. section 360bbb-3(b)(1), unless the authorization is terminated or revoked sooner.    Influenza A by PCR NEGATIVE NEGATIVE Final   Influenza B by PCR NEGATIVE NEGATIVE Final    Comment: (NOTE) The Xpert Xpress SARS-CoV-2/FLU/RSV assay is intended as an aid in  the diagnosis of influenza from Nasopharyngeal swab specimens and  should not be used as a sole basis for treatment. Nasal washings and  aspirates are unacceptable for Xpert Xpress SARS-CoV-2/FLU/RSV  testing. Fact Sheet for Patients: https://www.moore.com/https://www.fda.gov/media/142436/download Fact Sheet for Healthcare  Providers: https://www.young.biz/ This test is not yet approved or cleared by the [place] and  has been authorized for detection and/or diagnosis of SARS-CoV-2 by  FDA under an Emergency Use Authorization (EUA). This EUA will remain  in effect (meaning this test can be used) for the duration of the  Covid-19 declaration under Section 564(b)(1) of the Act, 21  U.S.C. section 360bbb-3(b)(1), unless the authorization is  terminated or revoked. Performed at [facility], [address], [place], [place] [address]   Culture, Urine     Status: None   Collection Time: 05/02/19 10:36 PM   Specimen: Urine, Clean Catch  Result Value Ref Range Status   Specimen Description   Final    URINE, CLEAN  CATCH Performed at [facility], [address] [place], [place] [address]    Special Requests   Final    NONE Performed at [facility], [address] [place], [place] [address]    Culture   Final    NO GROWTH Performed at [facility], [address], [place], [place] [address]    Report Status 05/04/2019 FINAL  Final     Labs: BNP (last 3 results) Recent Labs    05/02/19 2003  BNP 160.2*   Basic Metabolic Panel: Recent Labs  Lab 05/02/19 2003 05/03/19 0823 05/04/19 0145  NA 138 142 143  K 5.3* 3.8 4.3  CL 104 108 108  CO2 26 26 28   GLUCOSE 119* 92 95  BUN 27* 21 23  CREATININE 0.81 0.62 0.65  CALCIUM 8.8* 8.3* 9.1  MG  --   --  2.0  PHOS  --   --  3.4   Liver Function Tests: Recent Labs  Lab 05/02/19 2003 05/03/19 0823 05/04/19 0145  AST 23 16 16   ALT 12 12 13   ALKPHOS 66 64 63  BILITOT 0.9 0.5 0.7  PROT 6.3* 5.5* 5.9*  ALBUMIN 3.3* 2.8* 3.2*   No results for input(s): LIPASE, AMYLASE in the last 168 hours. No results for input(s): AMMONIA in the last 168 hours. CBC: Recent Labs  Lab 05/02/19 2003 05/03/19 0600 05/04/19 0145  WBC 7.1 5.1 5.4  NEUTROABS 5.0  --  3.3  HGB 11.7* 10.7* 10.7*  HCT 36.1 33.5* 33.3*  MCV 95.3 94.9 94.9  PLT 245 223 232   Cardiac Enzymes: Recent Labs  Lab 05/03/19 0009  CKTOTAL 57   BNP: Invalid input(s): POCBNP CBG: Recent Labs  Lab 05/03/19 1605 05/04/19 0815 05/04/19 1118  GLUCAP 80 75 81   D-Dimer Recent Labs    05/03/19 0823 05/04/19 0145  DDIMER 1.10* 0.88*   Hgb A1c No results for input(s): HGBA1C in the last 72 hours. Lipid Profile No results for input(s): CHOL, HDL, LDLCALC, TRIG, CHOLHDL, LDLDIRECT in the last 72 hours. Thyroid function studies No results for input(s): TSH, T4TOTAL, T3FREE, THYROIDAB in the last 72 hours.  Invalid input(s): FREET3 Anemia work up Recent Labs    05/04/19 0145  FERRITIN 161   Urinalysis     Component Value Date/Time   COLORURINE AMBER (A) 05/02/2019 1907   APPEARANCEUR CLOUDY (A) 05/02/2019 1907   LABSPEC 1.026 05/02/2019 1907   PHURINE 5.0 05/02/2019 1907   GLUCOSEU NEGATIVE 05/02/2019 1907   HGBUR NEGATIVE 05/02/2019 1907   BILIRUBINUR NEGATIVE 05/02/2019 1907   KETONESUR 5 (A) 05/02/2019 1907   PROTEINUR 100 (A) 05/02/2019 1907   NITRITE POSITIVE (A) 05/02/2019 1907   LEUKOCYTESUR MODERATE (  A) 05/02/2019 1907   Sepsis Labs Invalid input(s): PROCALCITONIN,  WBC,  LACTICIDVEN Microbiology Recent Results (from the past 240 hour(s))  Respiratory Panel by RT PCR (Flu A&B, Covid) - Nasopharyngeal Swab     Status: Abnormal   Collection Time: 05/02/19  8:03 PM   Specimen: Nasopharyngeal Swab  Result Value Ref Range Status   SARS Coronavirus 2 by RT PCR POSITIVE (A) NEGATIVE Final    Comment: CRITICAL RESULT CALLED TO, READ BACK BY AND VERIFIED WITH: rn z teeters at 2112 05/02/19 cruickshank a (NOTE) SARS-CoV-2 target nucleic acids are DETECTED. SARS-CoV-2 RNA is generally detectable in upper respiratory specimens  during the acute phase of infection. Positive results are indicative of the presence of the identified virus, but do not rule out bacterial infection or co-infection with other pathogens not detected by the test. Clinical correlation with patient history and other diagnostic information is necessary to determine patient infection status. The expected result is Negative. Fact Sheet for Patients:  PinkCheek.be Fact Sheet for Healthcare Providers: GravelBags.it This test is not yet approved or cleared by the [place] FDA and  has been authorized for detection and/or diagnosis of SARS-CoV-2 by FDA under an Emergency Use Authorization (EUA).  This EUA will remain in effect (meaning this te st can be used) for the duration of  the COVID-19 declaration under Section 564(b)(1) of the Act, 21 U.S.C.  section 360bbb-3(b)(1), unless the authorization is terminated or revoked sooner.    Influenza A by PCR NEGATIVE NEGATIVE Final   Influenza B by PCR NEGATIVE NEGATIVE Final    Comment: (NOTE) The Xpert Xpress SARS-CoV-2/FLU/RSV assay is intended as an aid in  the diagnosis of influenza from Nasopharyngeal swab specimens and  should not be used as a sole basis for treatment. Nasal washings and  aspirates are unacceptable for Xpert Xpress SARS-CoV-2/FLU/RSV  testing. Fact Sheet for Patients: PinkCheek.be Fact Sheet for Healthcare Providers: GravelBags.it This test is not yet approved or cleared by the [place] FDA and  has been authorized for detection and/or diagnosis of SARS-CoV-2 by  FDA under an Emergency Use Authorization (EUA). This EUA will remain  in effect (meaning this test can be used) for the duration of the  Covid-19 declaration under Section 564(b)(1) of the Act, 21  U.S.C. section 360bbb-3(b)(1), unless the authorization is  terminated or revoked. Performed at [facility], [place] [address] [place], [place] [address]   Culture, Urine     Status: None   Collection Time: 05/02/19 10:36 PM   Specimen: Urine, Clean Catch  Result Value Ref Range Status   Specimen Description   Final    URINE, CLEAN CATCH Performed at [facility], [place] [address], [place], [place] [address]    Special Requests   Final    NONE Performed at [facility], [place] [address], [place], [place] [address]    Culture   Final    NO GROWTH Performed at [facility], [place] [address] [place], [place] [address]    Report Status 05/04/2019 FINAL  Final     Time coordinating discharge: Over 30 minutes  SIGNED:   Little Ishikawa, DO [facility] 05/04/2019, 12:18 PM Pager   If 7PM-7AM, please contact night-coverage [email or web address] Password [email or web address]

## 2019-05-04 NOTE — Evaluation (Signed)
Physical Therapy Evaluation & Discharge Patient Details Name: Kathy Peterson MRN: [id number] DOB: Sep 26, 1926 Today's Date: 05/04/2019   History of Present Illness  Pt is a 85 y.o. female admitted from [facility] on 05/02/19 after unwitnessed fall, found to be hypoxic. Pt recently dx with COVID-19 (unclear when). Imaging negative for acute abnormality; head CT with remote R occipital infarct. PMH includes advanced dementia, CHF, COPD, DM2, HTN, polio (affected RLE at age 66), failure to thrive.    Clinical Impression  Patient evaluated by Physical Therapy with no further acute PT needs identified. Pt awake but minimal verbalizations and not following commands; per chart, from [facility]. Today, pt requiring totalA to perform bed mobility and sit EOB; bladder/bowel incontinence and dependent for pericare. SpO2 >/92% on RA. Pt likely near baseline mobility; no acute PT needs identified. Will require maximove lift for OOB with nursing staff. Recommend return to facility. Acute PT is signing off. Thank you for this referral.   Follow Up Recommendations SNF;Supervision/Assistance - 24 hour    Equipment Recommendations  None recommended by PT    Recommendations for Other Services       Precautions / Restrictions Precautions Precautions: Fall;Other (comment) Precaution Comments: Bowel/bladder incontinence Restrictions Weight Bearing Restrictions: No      Mobility  Bed Mobility Overal bed mobility: Needs Assistance Bed Mobility: Supine to Sit;Sit to Supine;Rolling Rolling: Total assist   Supine to sit: Total assist Sit to supine: Total assist   General bed mobility comments: TotalA to for supine<>sit EOB, pt moaning in pain, RE knee staying extended despite dangling at EOB; totalA+2 to roll R/L for pericare and linen change due to incontinence  Transfers                 General transfer comment: Unable  Ambulation/Gait                Stairs             Wheelchair Mobility    Modified Rankin (Stroke Patients Only)       Balance Overall balance assessment: Needs assistance   Sitting balance-Leahy Scale: Zero                                       Pertinent Vitals/Pain Pain Assessment: Faces Faces Pain Scale: Hurts little more Pain Location: BLE mobility Pain Descriptors / Indicators: Guarding;Grimacing;Moaning Pain Intervention(s): Monitored during session    Home Living Family/patient expects to be discharged to:: Assisted living                 Additional Comments: Per chart, from [facility]    Prior Function Level of Independence: Needs assistance   Gait / Transfers Assistance Needed: Pt admitted after fall at facility. Based on current functional status, likely not ambulatory at baseline, likely requiring assist for transfer OOB  ADL's / Homemaking Assistance Needed: Likely requiring assist for all ADLs  Comments: Pt with minimal verbalizations, not answering questions     Hand Dominance        Extremity/Trunk Assessment   Upper Extremity Assessment Upper Extremity Assessment: Difficult to assess due to impaired cognition    Lower Extremity Assessment Lower Extremity Assessment: Difficult to assess due to impaired cognition;RLE deficits/detail;LLE deficits/detail RLE Deficits / Details: Knee extension contracture, pt grimacing with repositioning and attempting to flex knee LLE Deficits / Details: Knee flexion contracture; bruising noted on  medial/posterior thigh; pt moaning/grimacing with PROM       Communication      Cognition Arousal/Alertness: Awake/alert Behavior During Therapy: Flat affect Overall Cognitive Status: No family/caregiver present to determine baseline cognitive functioning                                 General Comments: Pt responding to name intermittently, at times saying "yeah" or "ow", other than this, not answering questions.  Not following commands. Eyes open throughout session      General Comments General comments (skin integrity, edema, etc.): SpO2 >/92% on RA throughout session    Exercises     Assessment/Plan    PT Assessment All further PT needs can be met in the next venue of care  PT Problem List Decreased strength;Decreased range of motion;Decreased activity tolerance;Decreased balance;Decreased mobility;Decreased cognition;Decreased knowledge of use of DME;Decreased skin integrity       PT Treatment Interventions      PT Goals (Current goals can be found in the Care Plan section)  Acute Rehab PT Goals PT Goal Formulation: All assessment and education complete, DC therapy    Frequency     Barriers to discharge        Co-evaluation               AM-PAC PT "6 Clicks" Mobility  Outcome Measure Help needed turning from your back to your side while in a flat bed without using bedrails?: Total Help needed moving from lying on your back to sitting on the side of a flat bed without using bedrails?: Total Help needed moving to and from a bed to a chair (including a wheelchair)?: Total Help needed standing up from a chair using your arms (e.g., wheelchair or bedside chair)?: Total Help needed to walk in hospital room?: Total Help needed climbing 3-5 steps with a railing? : Total 6 Click Score: 6    End of Session   Activity Tolerance: Patient limited by fatigue Patient left: in bed;with call bell/phone within reach;with bed alarm set Nurse Communication: Mobility status;Need for lift equipment PT Visit Diagnosis: Other abnormalities of gait and mobility (R26.89);Muscle weakness (generalized) (M62.81)    Time: 1191-4782 PT Time Calculation (min) (ACUTE ONLY): 27 min   Charges:   PT Evaluation $PT Eval Moderate Complexity: 1 Mod PT Treatments $Therapeutic Activity: 8-22 mins   Mabeline Caras, PT, DPT Acute Rehabilitation Services  Pager [phone] Office  [phone]  Derry Lory 05/04/2019, 12:06 PM

## 2019-05-04 NOTE — Progress Notes (Signed)
Patient did well today but her nutrition consumption was poor.  She consumed no breakfast. 25% of her lunch and some fruit for dinner.  Her total Liquid intake today was 764ml/shift.  She had to bowel movements with soft dark stool.  She had two urine incontinence episodes and UOP measured of 161ml/shift.  Patient remains Alert to self., She is confused majority of therapy.  PT therapy was manageable with patient sitting at bedside momentarily.  Patient maintained O2 saturation in low 90's on RA during exertion.  She was agitated with therapy and complained of pain of unknown origin.

## 2019-05-05 LAB — COMPREHENSIVE METABOLIC PANEL
ALT: 12 U/L (ref 0–44)
AST: 17 U/L (ref 15–41)
Albumin: 3.1 g/dL — ABNORMAL LOW (ref 3.5–5.0)
Alkaline Phosphatase: 68 U/L (ref 38–126)
Anion gap: 14 (ref 5–15)
BUN: 21 mg/dL (ref 8–23)
CO2: 22 mmol/L (ref 22–32)
Calcium: 9 mg/dL (ref 8.9–10.3)
Chloride: 102 mmol/L (ref 98–111)
Creatinine, Ser: 0.74 mg/dL (ref 0.44–1.00)
GFR calc Af Amer: >60 mL/min (ref >60)
GFR calc non Af Amer: >60 mL/min (ref >60)
Glucose, Bld: 107 mg/dL — ABNORMAL HIGH (ref 70–99)
Potassium: 4.1 mmol/L (ref 3.5–5.1)
Sodium: 138 mmol/L (ref 135–145)
Total Bilirubin: 0.4 mg/dL (ref 0.3–1.2)
Total Protein: 6.1 g/dL — ABNORMAL LOW (ref 6.5–8.1)

## 2019-05-05 LAB — CBC WITH DIFFERENTIAL/PLATELET
Abs Immature Granulocytes: 0.03 10*3/uL (ref 0.00–0.07)
Basophils Absolute: 0 10*3/uL (ref 0.0–0.1)
Basophils Relative: 0 %
Eosinophils Absolute: 0 10*3/uL (ref 0.0–0.5)
Eosinophils Relative: 0 %
HCT: 33.8 % — ABNORMAL LOW (ref 36.0–46.0)
Hemoglobin: 10.9 g/dL — ABNORMAL LOW (ref 12.0–15.0)
Immature Granulocytes: 1 %
Lymphocytes Relative: 14 %
Lymphs Abs: 0.8 10*3/uL (ref 0.7–4.0)
MCH: 30.1 pg (ref 26.0–34.0)
MCHC: 32.2 g/dL (ref 30.0–36.0)
MCV: 93.4 fL (ref 80.0–100.0)
Monocytes Absolute: 0.5 10*3/uL (ref 0.1–1.0)
Monocytes Relative: 9 %
Neutro Abs: 4.4 10*3/uL (ref 1.7–7.7)
Neutrophils Relative %: 76 %
Platelets: 205 10*3/uL (ref 150–400)
RBC: 3.62 MIL/uL — ABNORMAL LOW (ref 3.87–5.11)
RDW: 11.9 % (ref 11.5–15.5)
WBC: 5.7 10*3/uL (ref 4.0–10.5)
nRBC: 0 % (ref 0.0–0.2)

## 2019-05-05 LAB — C-REACTIVE PROTEIN: CRP: 2.5 mg/dL — ABNORMAL HIGH (ref <1.0)

## 2019-05-05 LAB — FERRITIN: Ferritin: 177 ng/mL (ref 11–307)

## 2019-05-05 LAB — GLUCOSE, CAPILLARY
Glucose-Capillary: 98 mg/dL (ref 70–99)
Glucose-Capillary: 131 mg/dL — ABNORMAL HIGH (ref 70–99)

## 2019-05-05 LAB — PHOSPHORUS: Phosphorus: 3.3 mg/dL (ref 2.5–4.6)

## 2019-05-05 LAB — D-DIMER, QUANTITATIVE: D-Dimer, Quant: 1.06 ug/mL-FEU — ABNORMAL HIGH (ref 0.00–0.50)

## 2019-05-05 LAB — MAGNESIUM: Magnesium: 1.8 mg/dL (ref 1.7–2.4)

## 2019-05-05 NOTE — Discharge Summary (Signed)
Physician Discharge Summary  [place] [name]:[id number] DOB: March 02, 1927 DOA: 05/02/2019  PCP: [name], [name] date: 05/02/2019 Discharge date: 05/05/2019  Admitted From: ALF Disposition: ALF  Recommendations for Outpatient Follow-up:  1. Follow up with PCP in 1-2 weeks 2. Please obtain BMP/CBC in one week  Discharge Condition: Guarded CODE STATUS: DNR Diet recommendation: As tolerated  Brief/Interim Summary: Kathy Peterson is a 85 y.o. female with medical history significant of dementia, hypertension, hypothyroidism and COPD.I should emphasize that, due to her dementia, she is unable to provide any reliable history.  I therefore obtain my history by review of electronic medical records. Patient was referred from her facility after an unwitnessed fall.  Unfortunately patient does not recollect circumstances leading to her fall.  She does not have any complaints or history at this time. At the ED, hemodynamically stable and afebrile with a respiratory of 26 and saturating 100% on 2 L/min.  She tested positive for SARS-CoV-2 and chest radiograph was negative for any acute infiltrate.  Urinalysis was suggestive of urinary tract infection.  Patient transferred to [facility] for further evaluation given Covid positive swabbing, patient remains without hypoxia, nonambulatory at baseline per family discussion she does have recurrent UTIs, will continue antibiotics at discharge back to assisted living facility for additional 24 hours for 3-day course.  Transiently hypoxic at intake but has remained on room air for over 72 hours at this point without hypoxia.  Transition back to assisted living facility to complete antibiotics, follow with PCP for routine evaluation and treatment as indicated.  Certainly should patient's respiratory status worsen, patient become hypoxic or dyspneic patient should report back to the ED for further evaluation and treatment.  Discharge Diagnoses:  Active Problems:   COVID-19  virus infection    Discharge Instructions   Allergies as of 05/05/2019      Reactions   Bactrim [sulfamethoxazole-trimethoprim] Other (See Comments)   Unknown reaction per MAR    Penicillins Other (See Comments)   Unknown reaction per [facility]  Has patient had a PCN reaction causing immediate rash, facial/tongue/throat swelling, SOB or lightheadedness with hypotension: Unsure Has patient had a PCN reaction causing severe rash involving mucus membranes or skin necrosis: Unsure Has patient had a PCN reaction that required hospitalization Unsure Has patient had a PCN reaction occurring within the last 10 years: Unsure If all of the above answers are "NO", then may proceed with Cephalosporin use.   Pneumovax [pneumococcal Polysaccharide Vaccine] Other (See Comments)   Unknown reaction per MAR    Statins Other (See Comments)   Unknown reaction per MAR    Sulfa Antibiotics Other (See Comments)   Unknown reaction per [facility]       Medication List    TAKE these medications   acetaminophen 500 MG tablet Commonly known as: TYLENOL Take 500 mg by mouth 2 (two) times daily.   Advair HFA 115-21 MCG/ACT inhaler Generic drug: fluticasone-salmeterol Inhale 2 puffs into the lungs 2 (two) times daily.   albuterol 108 (90 Base) MCG/ACT inhaler Commonly known as: VENTOLIN HFA Inhale 2 puffs into the lungs every 6 (six) hours as needed for wheezing or shortness of breath.   Aripiprazole 1 MG/ML mL Take 2 mg by mouth daily.   busPIRone 5 MG tablet Commonly known as: BUSPAR Take 5 mg by mouth 2 (two) times daily as needed for anxiety.   Calcium 600+D 600-800 MG-UNIT Tabs Generic drug: Calcium Carb-Cholecalciferol Take 1 tablet by mouth daily with breakfast.   cephALEXin 500  MG capsule Commonly known as: Keflex Take 1 capsule (500 mg total) by mouth 2 (two) times daily for 2 days.   diltiazem 60 MG tablet Commonly known as: CARDIZEM Take 60 mg by mouth 2 (two) times daily.   docusate  sodium 100 MG capsule Commonly known as: COLACE Take 1 capsule (100 mg total) by mouth every Monday, Wednesday, and Friday.   escitalopram 10 MG tablet Commonly known as: LEXAPRO Take 15 mg by mouth daily.   feeding supplement (ENSURE ENLIVE) Liqd Take 237 mLs by mouth 2 (two) times daily between meals.   ferrous sulfate 325 (65 FE) MG tablet Take 325 mg by mouth every Monday, Wednesday, and Friday.   levothyroxine 88 MCG tablet Commonly known as: SYNTHROID Take 88 mcg by mouth at bedtime.   Lidocaine 4 % Ptch Apply 1 patch topically every morning. Remove every night. For right hip.   loratadine 10 MG tablet Commonly known as: CLARITIN Take 10 mg by mouth daily with breakfast.   losartan 25 MG tablet Commonly known as: COZAAR Take 25 mg by mouth daily with breakfast.   Melatonin 3 MG Tabs Take 3 mg by mouth at bedtime.   mirtazapine 15 MG tablet Commonly known as: REMERON Take 15 mg by mouth at bedtime.   OPTIVE 0.5-0.9 % ophthalmic solution Generic drug: carboxymethylcellul-glycerin Place 1 drop into both eyes every 12 (twelve) hours.   pantoprazole 20 MG tablet Commonly known as: PROTONIX Take 20 mg by mouth daily with breakfast.   psyllium 0.52 g capsule Commonly known as: REGULOID Take 0.52 g by mouth at bedtime.   Risamine 0.44-20.625 % Oint Generic drug: Menthol-Zinc Oxide Apply 1 application topically every 12 (twelve) hours. Apply to groin/buttocks topically every shift for redness   sodium chloride 1 g tablet Take 1 g by mouth daily.       Allergies  Allergen Reactions  . Bactrim [Sulfamethoxazole-Trimethoprim] Other (See Comments)    Unknown reaction per MAR   . Penicillins Other (See Comments)    [facility]  Has patient had a PCN reaction causing immediate rash, facial/tongue/throat swelling, SOB or lightheadedness with hypotension: Unsure Has patient had a PCN reaction causing severe rash involving mucus membranes or skin  necrosis: Unsure Has patient had a PCN reaction that required hospitalization Unsure Has patient had a PCN reaction occurring within the last 10 years: Unsure If all of the above answers are "NO", then may proceed with Cephalosporin use.  . Pneumovax [Pneumococcal Polysaccharide Vaccine] Other (See Comments)    Unknown reaction per [facility]   . Statins Other (See Comments)    Unknown reaction per [facility]   . Sulfa Antibiotics Other (See Comments)    [facility]     Procedures/Studies: DG Pelvis 1-2 Views  Result Date: 05/02/2019 CLINICAL DATA:  Pain following fall EXAM: PELVIS - 1-2 VIEW COMPARISON:  June 04, 2018 FINDINGS: There is chronic ankylosis of the right hip joint which appear stable. No acute fracture or dislocation evident. There is moderate narrowing of the left hip joint, stable. No erosive change. There are foci of arterial vascular calcification which appear stable. IMPRESSION: No acute fracture or dislocation. 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 Moderate narrowing left hip joint, stable. Foci of arterial vascular calcification noted. Electronically Signed   By: Bretta BangWilliam  Woodruff III M.D.   On: 05/02/2019 19:53   CT Head Wo Contrast  Result Date: 05/02/2019 CLINICAL DATA:  Unwitnessed fall.  Dementia EXAM: CT HEAD  WITHOUT CONTRAST CT CERVICAL SPINE WITHOUT CONTRAST TECHNIQUE: Multidetector CT imaging of the head and cervical spine was performed following the standard protocol without intravenous contrast. Multiplanar CT image reconstructions of the cervical spine were also generated. COMPARISON:  None. FINDINGS: CT HEAD FINDINGS Brain: No acute intracranial hemorrhage. No focal mass lesion. No CT evidence of acute infarction. No midline shift or mass effect. No hydrocephalus. Basilar cisterns are patent. Remote RIGHT occipital infarction. There are periventricular and subcortical white matter hypodensities. Generalized cortical atrophy. Vascular: No hyperdense  vessel or unexpected calcification. Skull: Normal. Negative for fracture or focal lesion. Sinuses/Orbits: Paranasal sinuses and mastoid air cells are clear. Orbits are clear. Other: None. CT CERVICAL SPINE FINDINGS Alignment: Anterolisthesis of C3 on C4 described below. Straightening of the normal cervical lordosis. Skull base and vertebrae: Normal craniocervical junction. 5 mm of anterolisthesis of C3 on C4 is similar to 4 mm on 06/04/2018. There is loss of joint space at C3-C4 as well as sclerosis of the vertebral bodies along the subluxation. Mild anterolisthesis by 2 mm of C7 on T1. No acute loss vertebral by height or disc height.  There is Disc levels:  Multilevel facet hypertrophy. Upper chest: Biapical partially calcified pleuroparenchymal thickening. Other: None IMPRESSION: 1. No acute intracranial findings. 2. Remote RIGHT occipital infarction. 3. No evidence of acute cervical spine fracture. 4. Progressive anterolisthesis of C3 on C4 is similar to CT from 06/04/2018. 5. Multilevel bulky facet hypertrophy. Electronically Signed   By: Genevive BiStewart  Edmunds M.D.   On: 05/02/2019 20:01   CT Cervical Spine Wo Contrast  Result Date: 05/02/2019 CLINICAL DATA:  Unwitnessed fall.  Dementia EXAM: CT HEAD WITHOUT CONTRAST CT CERVICAL SPINE WITHOUT CONTRAST TECHNIQUE: Multidetector CT imaging of the head and cervical spine was performed following the standard protocol without intravenous contrast. Multiplanar CT image reconstructions of the cervical spine were also generated. COMPARISON:  None. FINDINGS: CT HEAD FINDINGS Brain: No acute intracranial hemorrhage. No focal mass lesion. No CT evidence of acute infarction. No midline shift or mass effect. No hydrocephalus. Basilar cisterns are patent. Remote RIGHT occipital infarction. There are periventricular and subcortical white matter hypodensities. Generalized cortical atrophy. Vascular: No hyperdense vessel or unexpected calcification. Skull: Normal. Negative for  fracture or focal lesion. Sinuses/Orbits: Paranasal sinuses and mastoid air cells are clear. Orbits are clear. Other: None. CT CERVICAL SPINE FINDINGS Alignment: Anterolisthesis of C3 on C4 described below. Straightening of the normal cervical lordosis. Skull base and vertebrae: Normal craniocervical junction. 5 mm of anterolisthesis of C3 on C4 is similar to 4 mm on 06/04/2018. There is loss of joint space at C3-C4 as well as sclerosis of the vertebral bodies along the subluxation. Mild anterolisthesis by 2 mm of C7 on T1. No acute loss vertebral by height or disc height.  There is Disc levels:  Multilevel facet hypertrophy. Upper chest: Biapical partially calcified pleuroparenchymal thickening. Other: None IMPRESSION: 1. No acute intracranial findings. 2. Remote RIGHT occipital infarction. 3. No evidence of acute cervical spine fracture. 4. Progressive anterolisthesis of C3 on C4 is similar to CT from 06/04/2018. 5. Multilevel bulky facet hypertrophy. Electronically Signed   By: Genevive BiStewart  Edmunds M.D.   On: 05/02/2019 20:01   DG Chest Port 1 View  Result Date: 05/02/2019 CLINICAL DATA:  Pain following fall EXAM: PORTABLE CHEST 1 VIEW COMPARISON:  June 04, 2018 FINDINGS: The lungs are clear. Heart size and pulmonary vascularity are normal. Pacemaker leads are attached to the right atrium and right ventricle. There is aortic atherosclerosis. No  pneumothorax. No bone lesions. IMPRESSION: Lungs clear. Stable cardiac silhouette. Pacemaker leads attached to right atrium and right ventricle. Aortic Atherosclerosis (ICD10-I70.0). Electronically Signed   By: Lowella Grip III M.D.   On: 05/02/2019 19:54    Subjective: No acute issues or events overnight, tolerating p.o. well, declines any issues patient is poorly verbal at interview.   Discharge Exam: Vitals:   05/04/19 1915 05/05/19 0449  BP: (!) 158/76 (!) 143/54  Pulse: 93 85  Resp: 17   Temp: 98.3 F (36.8 C) 97.9 F (36.6 C)  SpO2: 96% 95%    Vitals:   05/04/19 1215 05/04/19 1644 05/04/19 1915 05/05/19 0449  BP: 126/68 139/64 (!) 158/76 (!) 143/54  Pulse: 80 90 93 85  Resp: 17 16 17    Temp: 98.1 F (36.7 C) 98.6 F (37 C) 98.3 F (36.8 C) 97.9 F (36.6 C)  TempSrc: Oral Oral Oral Oral  SpO2: 96% 97% 96% 95%  Height:        General:  Pleasantly resting in bed, No acute distress. HEENT:  Normocephalic atraumatic.  Sclerae nonicteric, noninjected.  Extraocular movements intact bilaterally. Neck:  Without mass or deformity.  Trachea is midline. Lungs:  Clear to auscultate bilaterally without rhonchi, wheeze, or rales. Heart:  Regular rate and rhythm.  Without murmurs, rubs, or gallops. Abdomen:  Soft, nontender, nondistended.  Without guarding or rebound. Extremities: Without cyanosis, clubbing, edema, or obvious deformity. Vascular:  Dorsalis pedis and posterior tibial pulses palpable bilaterally. Skin:  Warm and dry, no erythema, no ulcerations.   The results of significant diagnostics from this hospitalization (including imaging, microbiology, ancillary and laboratory) are listed below for reference.     Microbiology: Recent Results (from the past 240 hour(s))  Respiratory Panel by RT PCR (Flu A&B, Covid) - Nasopharyngeal Swab     Status: Abnormal   Collection Time: 05/02/19  8:03 PM   Specimen: Nasopharyngeal Swab  Result Value Ref Range Status   SARS Coronavirus 2 by RT PCR POSITIVE (A) NEGATIVE Final    Comment: CRITICAL RESULT CALLED TO, READ BACK BY AND VERIFIED WITH: rn z teeters at 2112 05/02/19 cruickshank a (NOTE) SARS-CoV-2 target nucleic acids are DETECTED. SARS-CoV-2 RNA is generally detectable in upper respiratory specimens  during the acute phase of infection. Positive results are indicative of the presence of the identified virus, but do not rule out bacterial infection or co-infection with other pathogens not detected by the test. Clinical correlation with patient history and other  diagnostic information is necessary to determine patient infection status. The expected result is Negative. Fact Sheet for Patients:  PinkCheek.be Fact Sheet for Healthcare Providers: GravelBags.it This test is not yet approved or cleared by the [place] FDA and  has been authorized for detection and/or diagnosis of SARS-CoV-2 by FDA under an Emergency Use Authorization (EUA).  This EUA will remain in effect (meaning this te st can be used) for the duration of  the COVID-19 declaration under Section 564(b)(1) of the Act, 21 U.S.C. section 360bbb-3(b)(1), unless the authorization is terminated or revoked sooner.    Influenza A by PCR NEGATIVE NEGATIVE Final   Influenza B by PCR NEGATIVE NEGATIVE Final    Comment: (NOTE) The Xpert Xpress SARS-CoV-2/FLU/RSV assay is intended as an aid in  the diagnosis of influenza from Nasopharyngeal swab specimens and  should not be used as a sole basis for treatment. Nasal washings and  aspirates are unacceptable for Xpert Xpress SARS-CoV-2/FLU/RSV  testing. Fact Sheet for Patients: PinkCheek.be Fact Sheet  for Healthcare Providers: https://www.young.biz/ This test is not yet approved or cleared by the [place] and  has been authorized for detection and/or diagnosis of SARS-CoV-2 by  FDA under an Emergency Use Authorization (EUA). This EUA will remain  in effect (meaning this test can be used) for the duration of the  Covid-19 declaration under Section 564(b)(1) of the Act, 21  U.S.C. section 360bbb-3(b)(1), unless the authorization is  terminated or revoked. Performed at [facility], [address], [place], [place] [address]   Culture, Urine     Status: None   Collection Time: 05/02/19 10:36 PM   Specimen: Urine, Clean Catch  Result Value Ref Range Status   Specimen Description   Final    URINE, CLEAN  CATCH Performed at [facility], [address], [place], [place] [address]    Special Requests   Final    NONE Performed at [facility], [address] [place], [place] [address]    Culture   Final    NO GROWTH Performed at [facility], [address], [place], [place] [address]    Report Status 05/04/2019 FINAL  Final     Labs: BNP (last 3 results) Recent Labs    05/02/19 2003  BNP 160.2*   Basic Metabolic Panel: Recent Labs  Lab 05/02/19 2003 05/03/19 0823 05/04/19 0145 05/05/19 0310  NA 138 142 143 138  K 5.3* 3.8 4.3 4.1  CL 104 108 108 102  CO2 26 26 28 22   GLUCOSE 119* 92 95 107*  BUN 27* 21 23 21   CREATININE 0.81 0.62 0.65 0.74  CALCIUM 8.8* 8.3* 9.1 9.0  MG  --   --  2.0 1.8  PHOS  --   --  3.4 3.3   Liver Function Tests: Recent Labs  Lab 05/02/19 2003 05/03/19 0823 05/04/19 0145 05/05/19 0310  AST 23 16 16 17   ALT 12 12 13 12   ALKPHOS 66 64 63 68  BILITOT 0.9 0.5 0.7 0.4  PROT 6.3* 5.5* 5.9* 6.1*  ALBUMIN 3.3* 2.8* 3.2* 3.1*   No results for input(s): LIPASE, AMYLASE in the last 168 hours. No results for input(s): AMMONIA in the last 168 hours. CBC: Recent Labs  Lab 05/02/19 2003 05/03/19 0600 05/04/19 0145 05/05/19 0310  WBC 7.1 5.1 5.4 5.7  NEUTROABS 5.0  --  3.3 4.4  HGB 11.7* 10.7* 10.7* 10.9*  HCT 36.1 33.5* 33.3* 33.8*  MCV 95.3 94.9 94.9 93.4  PLT 245 223 232 205   Cardiac Enzymes: Recent Labs  Lab 05/03/19 0009  CKTOTAL 57   BNP: Invalid input(s): POCBNP CBG: Recent Labs  Lab 05/03/19 1605 05/04/19 0815 05/04/19 1118 05/04/19 1645 05/05/19 0807  GLUCAP 80 75 81 139* 98   D-Dimer Recent Labs    05/04/19 0145 05/05/19 0310  DDIMER 0.88* 1.06*   Hgb A1c No results for input(s): HGBA1C in the last 72 hours. Lipid Profile No results for input(s): CHOL, HDL, LDLCALC, TRIG, CHOLHDL, LDLDIRECT in the last 72 hours. Thyroid function studies No results for  input(s): TSH, T4TOTAL, T3FREE, THYROIDAB in the last 72 hours.  Invalid input(s): FREET3 Anemia work up Recent Labs    05/04/19 0145 05/05/19 0310  FERRITIN 161 177   Urinalysis    Component Value Date/Time   COLORURINE AMBER (A) 05/02/2019 1907   APPEARANCEUR CLOUDY (A) 05/02/2019 1907   LABSPEC 1.026 05/02/2019 1907   PHURINE 5.0 05/02/2019 1907   GLUCOSEU NEGATIVE  05/02/2019 1907   HGBUR NEGATIVE 05/02/2019 1907   BILIRUBINUR NEGATIVE 05/02/2019 1907   KETONESUR 5 (A) 05/02/2019 1907   PROTEINUR 100 (A) 05/02/2019 1907   NITRITE POSITIVE (A) 05/02/2019 1907   LEUKOCYTESUR MODERATE (A) 05/02/2019 1907   Sepsis Labs Invalid input(s): PROCALCITONIN,  WBC,  LACTICIDVEN Microbiology Recent Results (from the past 240 hour(s))  Respiratory Panel by RT PCR (Flu A&B, Covid) - Nasopharyngeal Swab     Status: Abnormal   Collection Time: 05/02/19  8:03 PM   Specimen: Nasopharyngeal Swab  Result Value Ref Range Status   SARS Coronavirus 2 by RT PCR POSITIVE (A) NEGATIVE Final    Comment: CRITICAL RESULT CALLED TO, READ BACK BY AND VERIFIED WITH: rn z teeters at 2112 05/02/19 cruickshank a (NOTE) SARS-CoV-2 target nucleic acids are DETECTED. SARS-CoV-2 RNA is generally detectable in upper respiratory specimens  during the acute phase of infection. Positive results are indicative of the presence of the identified virus, but do not rule out bacterial infection or co-infection with other pathogens not detected by the test. Clinical correlation with patient history and other diagnostic information is necessary to determine patient infection status. The expected result is Negative. Fact Sheet for Patients:  https://www.moore.com/https://www.fda.gov/media/142436/download Fact Sheet for Healthcare Providers: https://www.young.biz/https://www.fda.gov/media/142435/download This test is not yet approved or cleared by the [place] FDA and  has been authorized for detection and/or diagnosis of SARS-CoV-2 by FDA under an  Emergency Use Authorization (EUA).  This EUA will remain in effect (meaning this te st can be used) for the duration of  the COVID-19 declaration under Section 564(b)(1) of the Act, 21 U.S.C. section 360bbb-3(b)(1), unless the authorization is terminated or revoked sooner.    Influenza A by PCR NEGATIVE NEGATIVE Final   Influenza B by PCR NEGATIVE NEGATIVE Final    Comment: (NOTE) The Xpert Xpress SARS-CoV-2/FLU/RSV assay is intended as an aid in  the diagnosis of influenza from Nasopharyngeal swab specimens and  should not be used as a sole basis for treatment. Nasal washings and  aspirates are unacceptable for Xpert Xpress SARS-CoV-2/FLU/RSV  testing. Fact Sheet for Patients: https://www.moore.com/https://www.fda.gov/media/142436/download Fact Sheet for Healthcare Providers: https://www.young.biz/https://www.fda.gov/media/142435/download This test is not yet approved or cleared by the [place] FDA and  has been authorized for detection and/or diagnosis of SARS-CoV-2 by  FDA under an Emergency Use Authorization (EUA). This EUA will remain  in effect (meaning this test can be used) for the duration of the  Covid-19 declaration under Section 564(b)(1) of the Act, 21  U.S.C. section 360bbb-3(b)(1), unless the authorization is  terminated or revoked. Performed at [facility], [address] [place], [place] [address]   Culture, Urine     Status: None   Collection Time: 05/02/19 10:36 PM   Specimen: Urine, Clean Catch  Result Value Ref Range Status   Specimen Description   Final    URINE, CLEAN CATCH Performed at [facility], [address] [place], [place] [address]    Special Requests   Final    NONE Performed at [facility], [address] [place], [place] [address]    Culture   Final    NO GROWTH Performed at [facility], [address] [place], [place] [address]    Report Status 05/04/2019 FINAL  Final     Time coordinating discharge:  Over 30 minutes  SIGNED:   Azucena FallenWilliam C Kalenna Millett, DO [facility] 05/05/2019, 8:45 AM Pager   If 7PM-7AM, please contact  night-coverage [email or web address] Password [email or web address]

## 2019-05-05 NOTE — Progress Notes (Signed)
EMS arrived to transport patient back to [facility]. Vitals were stable and neurological status was at baseline. No oxygen required for transport.

## 2019-05-05 NOTE — Progress Notes (Signed)
Family updated:  I was able to speak with Chesley Noon and gave her updates on Mr. Krontz plan of care. I informed her that the plan was that Ms. Labarre would be going back to the ALF she originally was staying at, and that her mother was not requiring any oxygen at this time. [name] verbalized understanding of this and had no further questions.

## 2019-05-05 NOTE — TOC Transition Note (Signed)
Transition of Care [facility]) - CM/SW Discharge Note   Patient Details  Name: Kathy Peterson MRN: [id number] Date of Birth: 12-08-1926  [facility] CM/SW Contact:  Amador Cunas, [place] Phone Number: 05/05/2019, 1:17 PM   Clinical Narrative:   Pt for dc back to [facility] ALF today. Spoke to [place] at facility who confirmed pt able to return today. DC summary and FL2 faxed to facility. Pt's dtr-in-law, Kathy Peterson, notified of dc. SW signing off at d/c.   Wandra Feinstein, MSW, LCSW [phone] (GV coverage)       Final next level of care: Assisted Living Barriers to Discharge: No Barriers Identified   Patient Goals and CMS Choice        Discharge Placement                Patient to be transferred to facility by: [place] Name of family member notified: Kathy Peterson Patient and family notified of of transfer: 05/05/19  Discharge Plan and Services                                     Social Determinants of Health (SDOH) Interventions     Readmission Risk Interventions No flowsheet data found.

## 2019-05-05 NOTE — Discharge Instructions (Signed)

## 2019-05-05 NOTE — NC FL2 (Signed)
[place] LEVEL OF CARE SCREENING TOOL     IDENTIFICATION  Patient Name: Kathy Peterson Birthdate: 1926/08/19 Sex: female Admission Date (Current Location): 05/02/2019  [facility] and [place] Number:  Herbalist and Address:  ([facility])      Provider Number: [id number]  Attending Physician Name and Address:  Little Ishikawa, MD  Relative Name and Phone Number:       Current Level of Care: Hospital Recommended Level of Care: [place] Prior Approval Number:    Date Approved/Denied:   PASRR Number:    Discharge Plan: (ALF)    Current Diagnoses: Patient Active Problem List   Diagnosis Date Noted  . COVID-19 virus infection 05/02/2019  . Acute metabolic encephalopathy   . Altered mental status 12/14/2016  . MDD (major depressive disorder), recurrent severe, without psychosis ([place]) 04/08/2016  . COPD exacerbation ([place]) 04/08/2016  . History of pacemaker 04/08/2016  . QT prolongation 04/08/2016  . Episode of recurrent major depressive disorder ([place])   . FTT (failure to thrive) in adult   . Hyponatremia 12/24/2015  . Rib fractures 12/23/2015  . Atrial fibrillation ([place]) 12/23/2015  . CHF (congestive heart failure) ([place]) 12/23/2015  . COPD (chronic obstructive pulmonary disease) ([place]) 12/23/2015  . Pre-diabetes 12/23/2015  . Essential (primary) hypertension 12/23/2015  . Hypothyroidism 12/23/2015  . Recurrent falls 12/23/2015    Orientation RESPIRATION BLADDER Height & Weight     Self  Normal Incontinent Weight:   Height:  5\' 6"  (167.6 cm)  BEHAVIORAL SYMPTOMS/MOOD NEUROLOGICAL BOWEL NUTRITION STATUS      Incontinent (Regular diet)  AMBULATORY STATUS COMMUNICATION OF NEEDS Skin   Extensive Assist Verbally                         Personal Care Assistance Level of Assistance  Bathing, Feeding, Dressing Bathing Assistance: Limited assistance Feeding assistance: Limited assistance Dressing  Assistance: Maximum assistance     Functional Limitations Info             SPECIAL CARE FACTORS FREQUENCY                       Contractures      Additional Factors Info  Code Status Code Status Info: DNR             Current Medications (05/05/2019):  This is the current hospital active medication list Current Facility-Administered Medications  Medication Dose Route Frequency Provider Last Rate Last Admin  . acetaminophen (TYLENOL) tablet 650 mg  650 mg Oral Q6H PRN Chinbuah, Egya N, MD      . albuterol (VENTOLIN HFA) 108 (90 Base) MCG/ACT inhaler 2 puff  2 puff Inhalation Q6H PRN Phineas Semen, MD   2 puff at 05/04/19 1028  . ascorbic acid (VITAMIN C) tablet 500 mg  500 mg Oral Daily Phineas Semen, MD   500 mg at 05/05/19 4332  . busPIRone (BUSPAR) tablet 5 mg  5 mg Oral BID PRN Phineas Semen, MD   5 mg at 05/05/19 0834  . calcium-vitamin D (OSCAL WITH D) 500-200 MG-UNIT per tablet 1 tablet  1 tablet Oral Q breakfast Phineas Semen, MD   1 tablet at 05/05/19 [phone]  . cefTRIAXone (ROCEPHIN) 1 g in sodium chloride 0.9 % 100 mL IVPB  1 g Intravenous Q24H Phineas Semen, MD 200 mL/hr at 05/04/19 2057 1 g at 05/04/19 2057  . chlorpheniramine-HYDROcodone (  TUSSIONEX) 10-8 MG/5ML suspension 5 mL  5 mL Oral Q12H PRN Chinbuah, Paula Compton, MD      . diltiazem (CARDIZEM) tablet 60 mg  60 mg Oral BID Leonette Nutting, MD   60 mg at 05/05/19 1829  . docusate sodium (COLACE) capsule 100 mg  100 mg Oral Q M,W,F Leonette Nutting, MD   100 mg at 05/04/19 1005  . enoxaparin (LOVENOX) injection 40 mg  40 mg Subcutaneous QHS Leonette Nutting, MD   40 mg at 05/04/19 2046  . escitalopram (LEXAPRO) tablet 15 mg  15 mg Oral Daily Leonette Nutting, MD   15 mg at 05/05/19 9371  . guaiFENesin-dextromethorphan (ROBITUSSIN DM) 100-10 MG/5ML syrup 10 mL  10 mL Oral Q4H PRN Chinbuah, Egya N, MD      . influenza vaccine adjuvanted (FLUAD) injection 0.5 mL  0.5 mL Intramuscular Tomorrow-1000  Briant Cedar, MD      . levothyroxine (SYNTHROID) tablet 88 mcg  88 mcg Oral QHS Leonette Nutting, MD   88 mcg at 05/04/19 2045  . lidocaine (LIDODERM) 5 % 1 patch  1 patch Transdermal q morning - 10a Leonette Nutting, MD   1 patch at 05/05/19 [phone]  . loratadine (CLARITIN) tablet 10 mg  10 mg Oral Q breakfast Leonette Nutting, MD   10 mg at 05/05/19 8938  . losartan (COZAAR) tablet 25 mg  25 mg Oral Q breakfast Leonette Nutting, MD   25 mg at 05/05/19 1017  . Melatonin TABS 3 mg  3 mg Oral QHS Leonette Nutting, MD   3 mg at 05/04/19 2045  . mirtazapine (REMERON) tablet 15 mg  15 mg Oral QHS Leonette Nutting, MD   15 mg at 05/04/19 2045  . pantoprazole (PROTONIX) EC tablet 20 mg  20 mg Oral Q breakfast Leonette Nutting, MD   20 mg at 05/05/19 0834  . polyethylene glycol (MIRALAX / GLYCOLAX) packet 17 g  17 g Oral Daily PRN Chinbuah, Paula Compton, MD      . psyllium (HYDROCIL/METAMUCIL) packet 1 packet  1 packet Oral QHS Leonette Nutting, MD   1 packet at 05/04/19 2045  . sodium chloride tablet 1 g  1 g Oral Daily Leonette Nutting, MD   1 g at 05/05/19 0834  . zinc sulfate capsule 220 mg  220 mg Oral Daily Leonette Nutting, MD   220 mg at 05/05/19 5102     Discharge Medications: Please see discharge summary for a list of discharge medications.  Relevant Imaging Results:  Relevant Lab Results:   Additional Information    Deatra Robinson, [place]

## 2019-06-22 ENCOUNTER — Telehealth: Payer: Self-pay

## 2019-06-22 NOTE — Telephone Encounter (Signed)
Received call from [facility] with [facility] inquiring when NP will be able to see patient. Geraldine Contras is questioning whether patient is eligible for hospice services. Placed patient on NP schedule for tomorrow and will update NP

## 2019-06-22 NOTE — Progress Notes (Addendum)
Feb 4th, 2022 Kathy Peterson Palliative Care Consult Note Telephone: [phone]  Fax: [phone]  PATIENT NAME: Kathy Peterson DOB: [birth year] MRN: [id number] [facility] [address] (move in date 05/16/2015)  PRIMARY CARE PROVIDER:   [name], MD  REFERRING PROVIDER:  [name], MD [facility]) [phone] [address] [place],  [place] [address] [phone]) Kathy Peterson [phone] (let know if hospice; BG)  RESPONSIBLE PARTY:   Kathy Peterson (D-I-L) (M) [phone]. (H) [phone]. [email or web address]  ASSESSMENT / RECOMMENDATIONS:  1. Advance Care Planning:  A. Directives: DNR on facility chart, and in [facility] EMR  2. Cognitive / Functional status: FAST 7c. Patient is constantly confused; will open eyes to voice. Possibly oriented to name. Establishes transient eye contact. Speaks 5 or less words/day. Staff report increased somnolence; awake only about 6 hours/day. Patient's sedating medication were adjusted downwards (discontinuation of Buspar; Lexapro changed to prn) to decrease sedating effects. Hollers out with repositioning thought d/t discomfort form sacral wound and LE contractures. Dependent for transfers (2 person), hygiene, and dressing. Incontinent of bowel and bladder. Sacral wound reported to be size of a quarter, stage 2, and increasing in size. Wound care by [facility]. Skipping about 1 meal a day, and averages 25-50% of other meals. Ensure supplements. Weight one month ago was 120.8 lbs, which was a decreased of 5 lbs (4% of body weight) over the prior 3 months, and loss of 37.2 lbs (23.5 % of body weight) over the previous 6 months.  3. Follow up Palliative Care Visit: Referred to hospice. I did speak to patient's D-I-L Kathy Peterson, who was in agreement with referral. I am awaiting Dr. Pilar Peterson call back.  I spent 60 minutes providing this consultation from 9am to 10am. More than 50% of the time in this consultation was spent coordinating  communication.   HISTORY OF PRESENT ILLNESS:  Kathy Peterson is a 86 y.o. female with h/o unspecified dementia without behavioral disturbance, UTIs, cardiac pacemaker, anxiety disorder, heart failure, generalized muscle weaknes, dysphagia, L rib fractures, falls, pneumonia, GERD, hypothyroidism, DM type 2, HTN, atrial fibrillation, combined systolic and diastolic CHF, COPD, arthritis.  12/14-12/17/2020 COVID resp infection Palliative Care was asked to help address goals of care.   CODE STATUS: DNR  PPS: 30%  HOSPICE ELIGIBILITY/DIAGNOSIS: Yes; awaiting call back from Kathy Peterson to get his okay for initiation of [facility].   PAST MEDICAL HISTORY:  Past Medical History:  Diagnosis Date  . Arthritis    rheumatoid  . CHF (congestive heart failure) (HCC)   . COPD (chronic obstructive pulmonary disease) (HCC)   . Depression   . Diabetes mellitus without complication (HCC)    type 2  . Failure to thrive in adult   . Frequent falls   . GERD (gastroesophageal reflux disease)   . Hypertension   . Hyponatremia   . Orthostatic hypotension   . Pacemaker   . PAF (paroxysmal atrial fibrillation) (HCC)    a. ? mentioned in notes, no further details available.  . Pneumonia   . Polio   . Secondhand smoke exposure   . Shortness of breath   . Thyroid disease    hypothyroid    SOCIAL HX:  Social History   Tobacco Use  . Smoking status: Never Smoker  . Smokeless tobacco: Never Used  Substance Use Topics  . Alcohol use: No    ALLERGIES:  Allergies  Allergen Reactions  . Bactrim [Sulfamethoxazole-Trimethoprim] Other (See Comments)  Unknown reaction per MAR   . Penicillins Other (See Comments)    Unknown reaction per [facility]  Has patient had a PCN reaction causing immediate rash, facial/tongue/throat swelling, SOB or lightheadedness with hypotension: Unsure Has patient had a PCN reaction causing severe rash involving mucus membranes or skin necrosis: Unsure Has patient had a PCN  reaction that required hospitalization Unsure Has patient had a PCN reaction occurring within the last 10 years: Unsure If all of the above answers are "NO", then may proceed with Cephalosporin use.  . Pneumovax [Pneumococcal Polysaccharide Vaccine] Other (See Comments)    Unknown reaction per MAR   . Statins Other (See Comments)    Unknown reaction per MAR   . Sulfa Antibiotics Other (See Comments)    Unknown reaction per [facility]      PERTINENT MEDICATIONS:  Outpatient Encounter Medications as of 06/23/2019  Medication Sig  . acetaminophen (TYLENOL) 500 MG tablet Take 500 mg by mouth 2 (two) times daily.   . busPIRone (BUSPAR) 5 MG tablet Take 5 mg by mouth 2 (two) times daily as needed for anxiety.  . Calcium Carb-Cholecalciferol (CALCIUM 600+D) 600-800 MG-UNIT TABS Take 1 tablet by mouth daily with breakfast.   . Carboxymethylcellul-Glycerin (OPTIVE) 0.5-0.9 % SOLN Place 1 drop into both eyes every 12 (twelve) hours.  Marland Kitchen diltiazem (CARDIZEM) 60 MG tablet Take 60 mg by mouth 2 (two) times daily.  Marland Kitchen docusate sodium (COLACE) 100 MG capsule Take 1 capsule (100 mg total) by mouth every Monday, Wednesday, and Friday.  . escitalopram (LEXAPRO) 10 MG tablet Take 15 mg by mouth daily.  . feeding supplement, ENSURE ENLIVE, (ENSURE ENLIVE) LIQD Take 237 mLs by mouth 2 (two) times daily between meals.  . ferrous sulfate 325 (65 FE) MG tablet Take 325 mg by mouth every Monday, Wednesday, and Friday.  . fluticasone-salmeterol (ADVAIR HFA) 115-21 MCG/ACT inhaler Inhale 2 puffs into the lungs 2 (two) times daily.  Marland Kitchen levothyroxine (SYNTHROID, LEVOTHROID) 88 MCG tablet Take 88 mcg by mouth at bedtime.  . Lidocaine 4 % PTCH Apply 1 patch topically every morning. Remove every night. For right hip.  Marland Kitchen loratadine (CLARITIN) 10 MG tablet Take 10 mg by mouth daily with breakfast.   . losartan (COZAAR) 25 MG tablet Take 25 mg by mouth daily with breakfast.   . Melatonin 3 MG TABS Take 3 mg by mouth at bedtime.  .  mirtazapine (REMERON) 15 MG tablet Take 15 mg by mouth at bedtime.  . pantoprazole (PROTONIX) 20 MG tablet Take 20 mg by mouth daily with breakfast.   . psyllium (REGULOID) 0.52 g capsule Take 0.52 g by mouth at bedtime.  . sodium chloride 1 g tablet Take 1 g by mouth daily.  . Menthol-Zinc Oxide (RISAMINE) 0.44-20.625 % OINT Apply 1 application topically every 12 (twelve) hours. Apply to groin/buttocks topically every shift for redness  . [DISCONTINUED] albuterol (PROVENTIL HFA;VENTOLIN HFA) 108 (90 Base) MCG/ACT inhaler Inhale 2 puffs into the lungs every 6 (six) hours as needed for wheezing or shortness of breath.  . [DISCONTINUED] Aripiprazole 1 MG/ML mL Take 2 mg by mouth daily.   No facility-administered encounter medications on file as of 06/23/2019.    PHYSICAL EXAM:   RR 12,HR 72 regular;  Ellderly female semi lying on right side asleep. Awakens to verbal and tactile stimuli. Spoke a garbled word. Only transiently maintaining eye contact; quickly back to sleep again.  Cardiovascular: regular rate and rhythm; systolic murmur. No rubs/gallops Pulmonary: clear ant fields Abdomen:  soft, nontender, + bowel sounds Extremities: no edema, R LE straight, L LE contracture 90degrees Skin: no rashes. Diaper / pants intact. Neurological: Weakness, non-focal  Julianne Handler, NP

## 2019-06-23 ENCOUNTER — Non-Acute Institutional Stay: Payer: Medicare HMO | Admitting: Internal Medicine

## 2019-06-23 ENCOUNTER — Encounter: Payer: Self-pay | Admitting: Internal Medicine

## 2019-06-23 ENCOUNTER — Other Ambulatory Visit: Payer: Self-pay

## 2019-06-23 VITALS — Wt 120.8 lb

## 2019-06-23 DIAGNOSIS — Z515 Encounter for palliative care: Secondary | ICD-10-CM

## 2019-06-23 DIAGNOSIS — Z7189 Other specified counseling: Secondary | ICD-10-CM

## 2019-09-03 IMAGING — CT CT HIP*R* W/O CM
2 of 3 series · 17 of 46 positions shown, 19 images · non-contrast
Comparison: CT scan 06/18/2016

CLINICAL DATA: Multiple falls. Right hip pain. History of remote
right hip arthrodesis.

EXAM:
CT OF THE RIGHT HIP WITHOUT CONTRAST
TECHNIQUE: Multidetector CT imaging of the right hip was performed according to
the standard protocol. Multiplanar CT image reconstructions were
also generated.

[Series 5: hip 2.0 st · axial · 0.48mm/px · z∈[+822,+1008]mm · 14 of 107 slices shown, 16 images]
[im 7/107  soft-tissue]
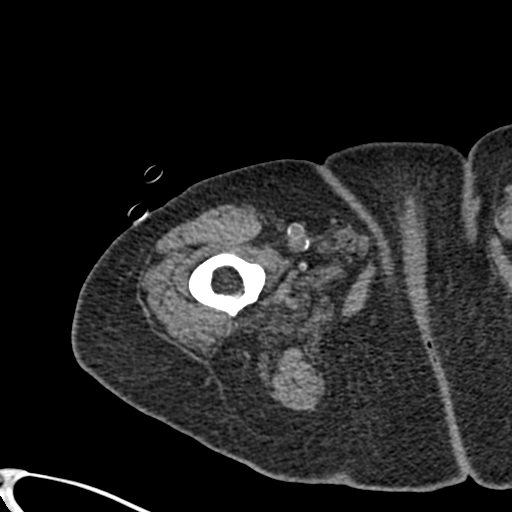
[im 7/107  bone]
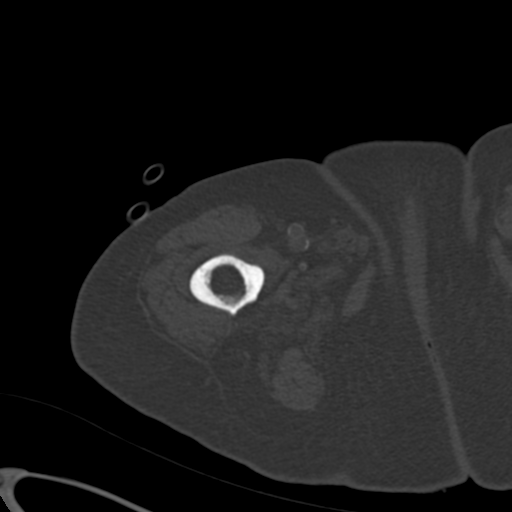
[im 14/107  soft-tissue]
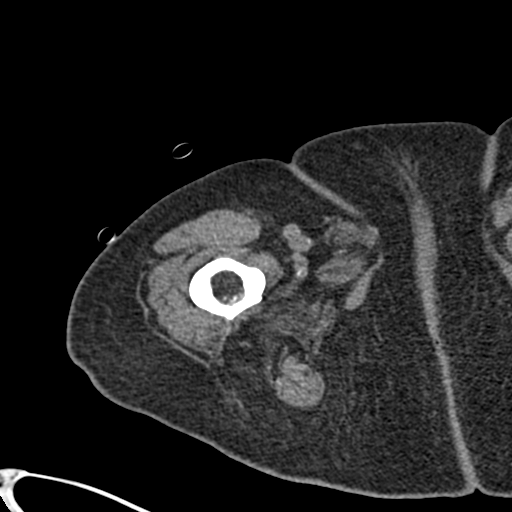
[im 21/107  soft-tissue]
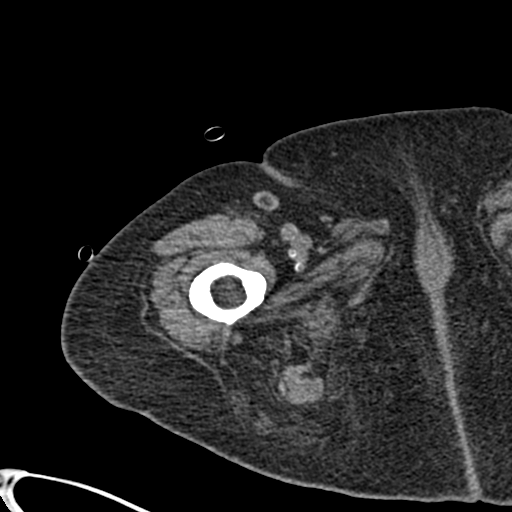
[im 28/107  soft-tissue]
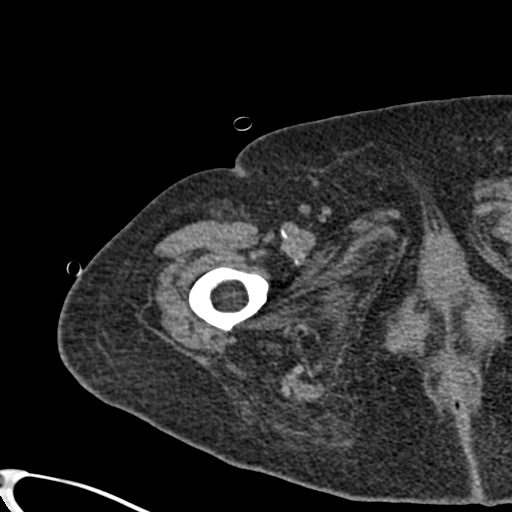
[im 35/107  soft-tissue]
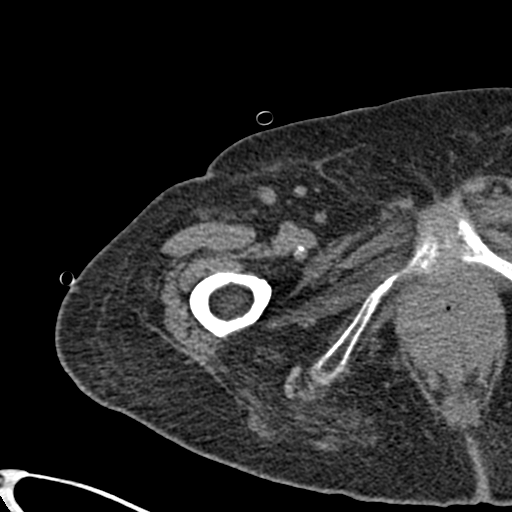
[im 42/107  soft-tissue]
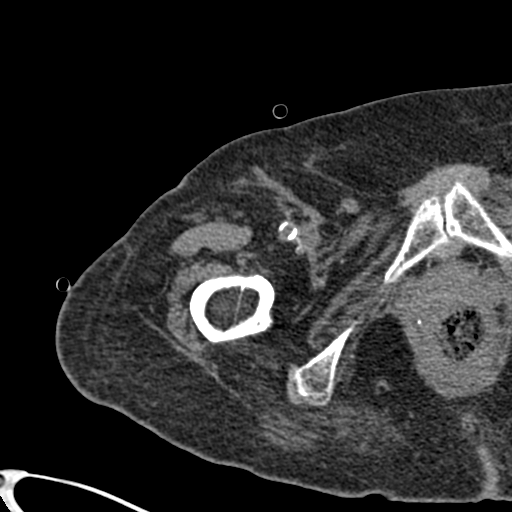
[im 48/107  soft-tissue]
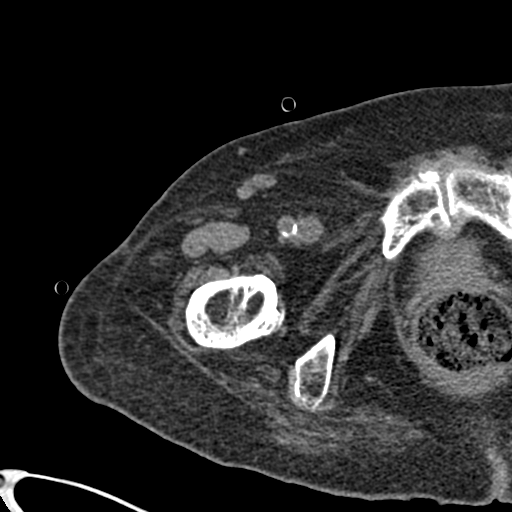
[im 59/107  soft-tissue]
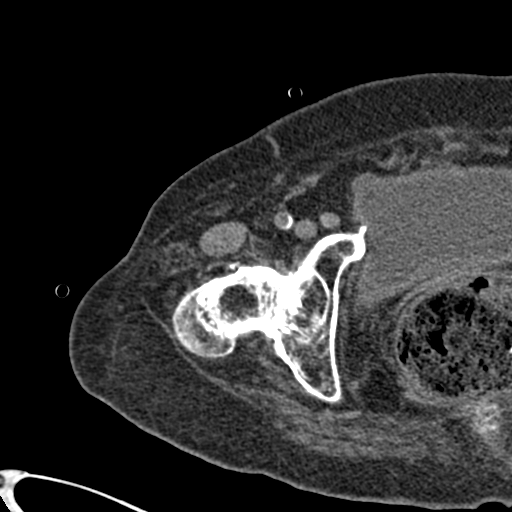
[im 65/107  soft-tissue]
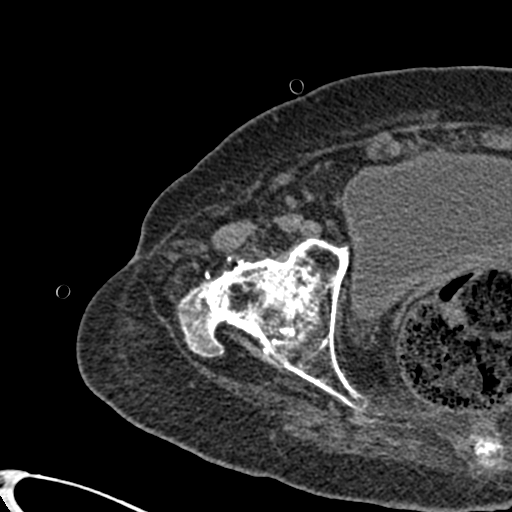
[im 65/107  bone]
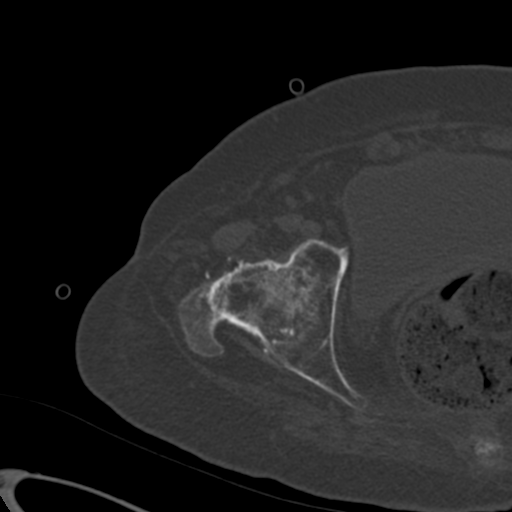
[im 72/107  soft-tissue]
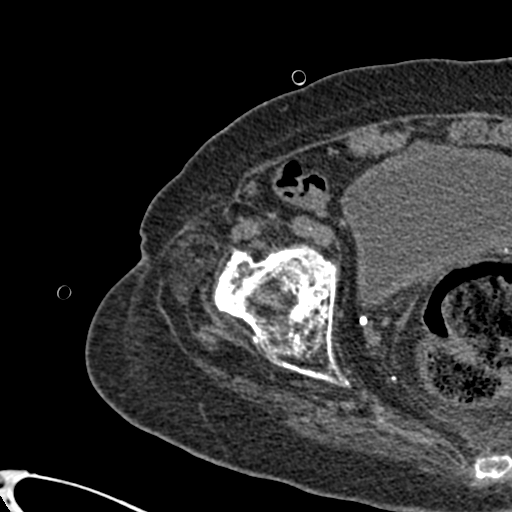
[im 79/107  soft-tissue]
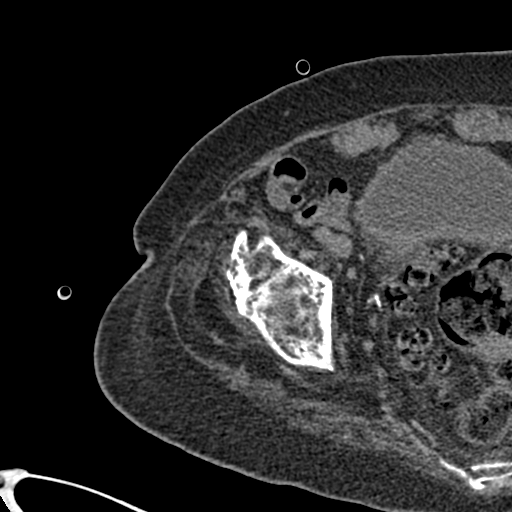
[im 86/107  soft-tissue]
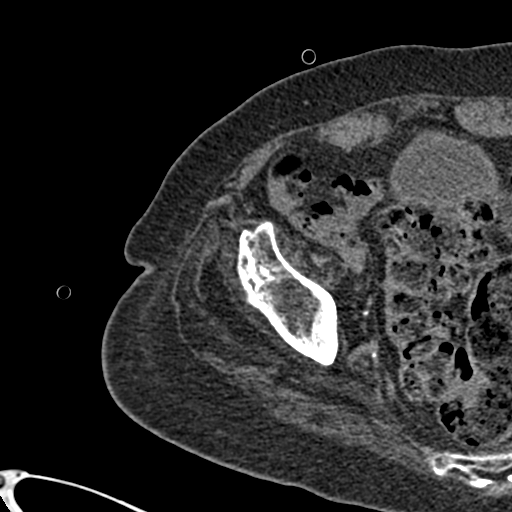
[im 93/107  soft-tissue]
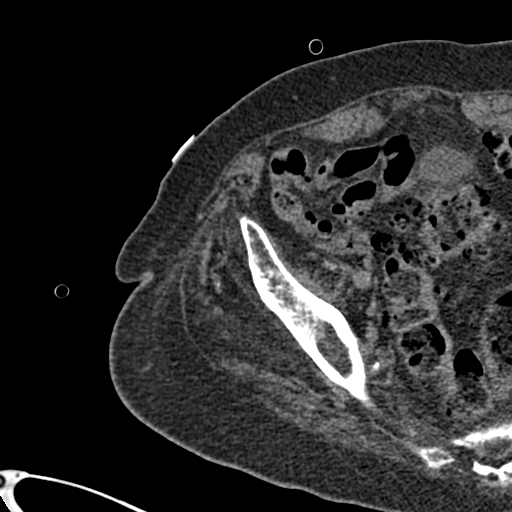
[im 100/107  soft-tissue]
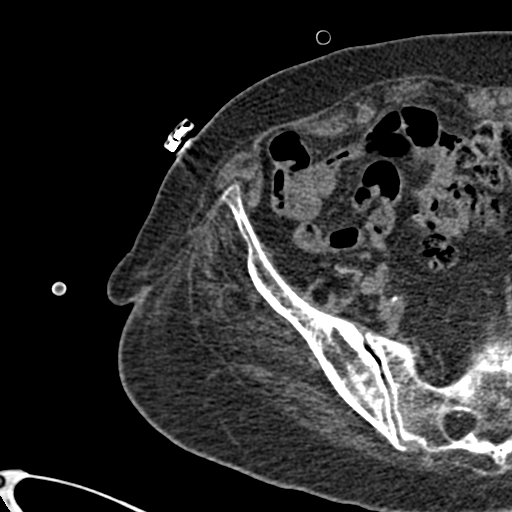

[Series 8: hip 2.0 cor. st · coronal · 0.42mm/px · 3 of 113 slices shown]
[im 38/113  soft-tissue]
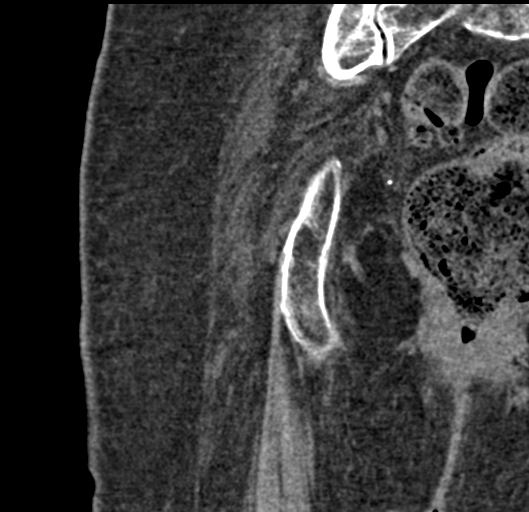
[im 50/113  soft-tissue]
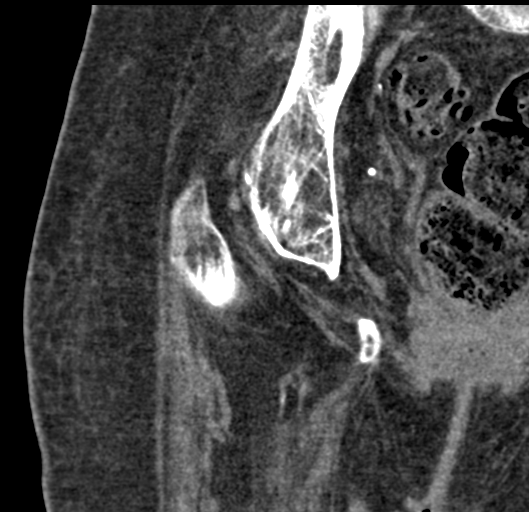
[im 63/113  soft-tissue]
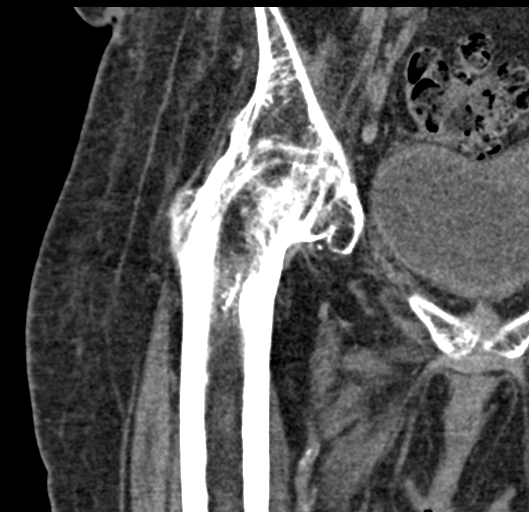

[17 of 46 positions shown; findings below may reference images not displayed]

FINDINGS: Chronic/ remote right hip arthrodesis. No acute fracture is
identified. Stable degenerative changes involving the pubic
symphysis and right SI joint. No fractures involving the right
hemipelvis are identified.

No significant intrapelvic abnormalities are identified. Advanced
vascular calcifications without definite aneurysm. Small scattered
inguinal lymph nodes are noted. Marked fatty atrophy of the right
hip and pelvic musculature likely related to the patient's hip
fusion.
IMPRESSION: 1. Chronic right hip fusion.
2. No acute fracture involving the right hip or right hemipelvis.

## 2019-09-03 IMAGING — CT CT CERVICAL SPINE W/O CM
4 of 7 series · 15 of 33 positions shown, 16 images · non-contrast
Comparison: CT head and C-spine 01/13/2017

CLINICAL DATA: Fall today with head injury.  Headache and neck pain

EXAM:
CT HEAD WITHOUT CONTRAST
CT CERVICAL SPINE WITHOUT CONTRAST
TECHNIQUE: Multidetector CT imaging of the head and cervical spine was
performed following the standard protocol without intravenous
contrast. Multiplanar CT image reconstructions of the cervical spine
were also generated.

[Series 6: head 3.0 mpr cor · coronal · 0.30mm/px · 2 of 64 slices shown]
[im 22/64  bone]
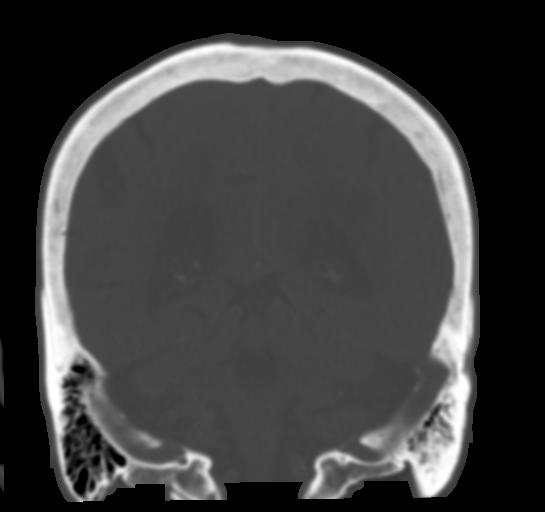
[im 43/64  bone]
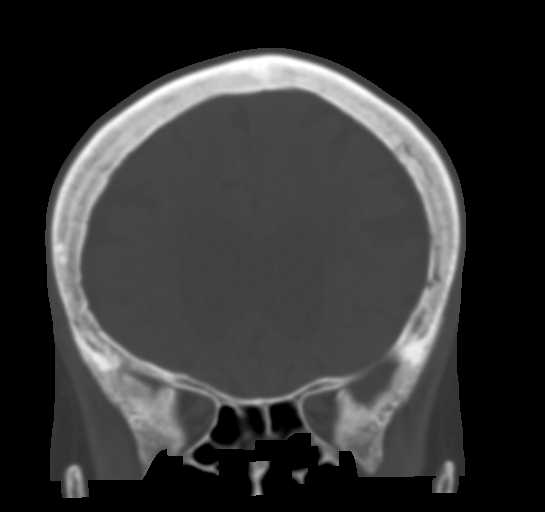

[Series 9: c_spine 2.0 st · axial · 0.30mm/px · z∈[-303,-201]mm · 4 of 87 slices shown, 5 images]
[im 18/87  soft-tissue]
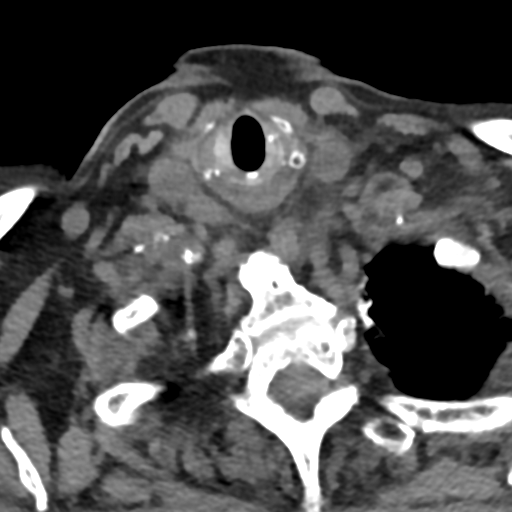
[im 18/87  bone]
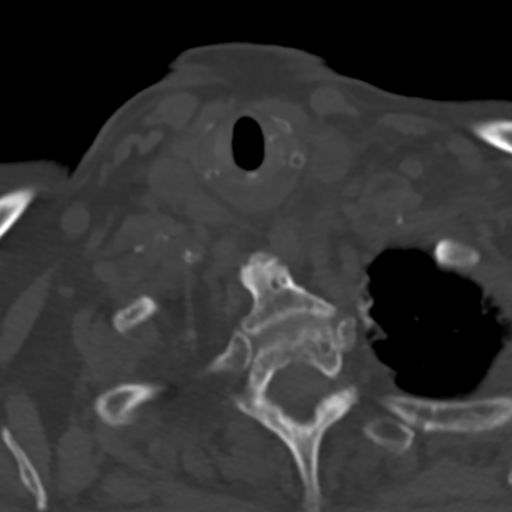
[im 35/87  bone]
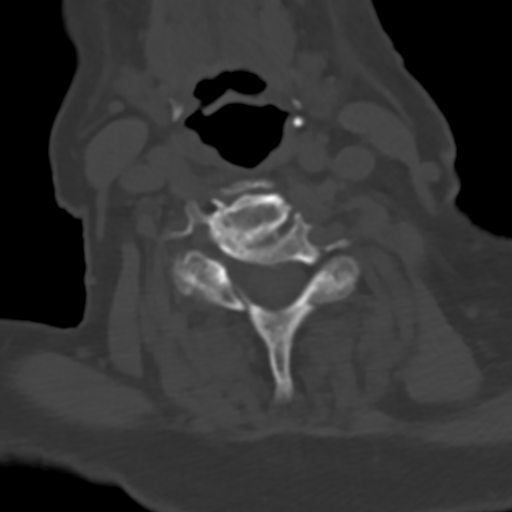
[im 52/87  bone]
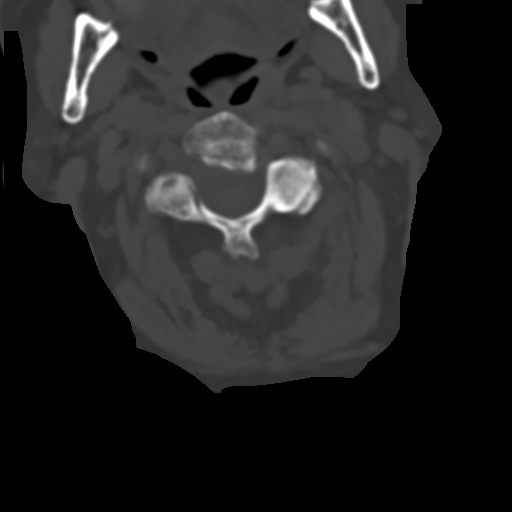
[im 69/87  bone]
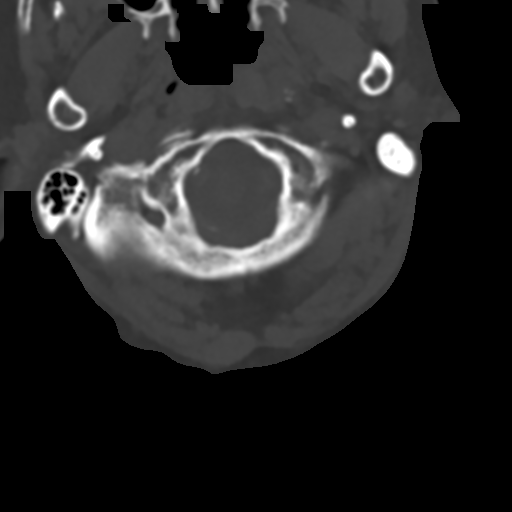

[Series 11: sagittal bone · sagittal · 0.37mm/px · 5 of 52 slices shown]
[im 9/52  bone]
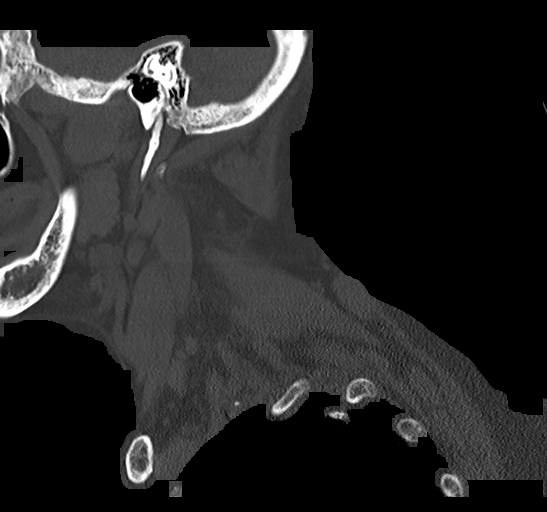
[im 18/52  bone]
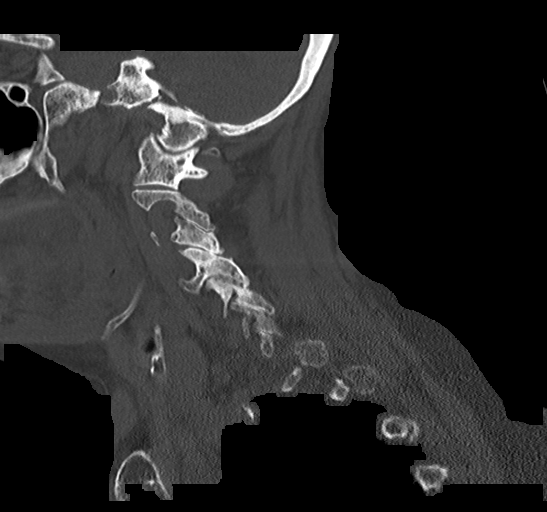
[im 26/52  bone]
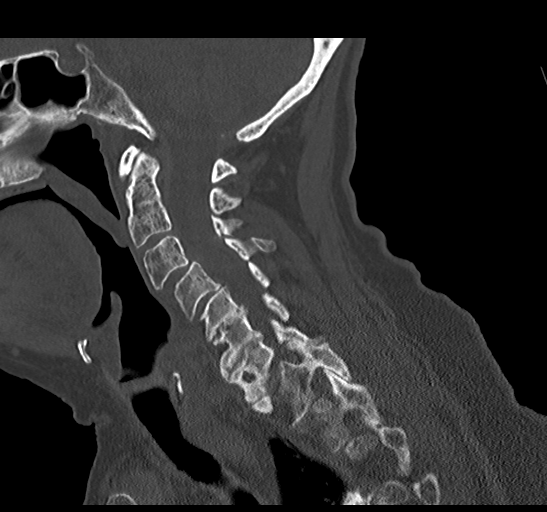
[im 35/52  bone]
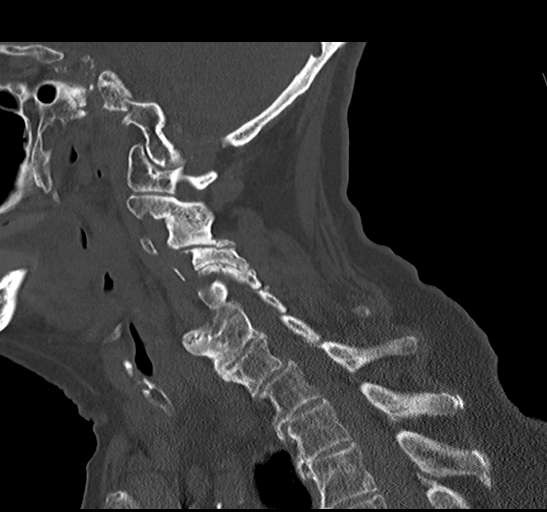
[im 43/52  bone]
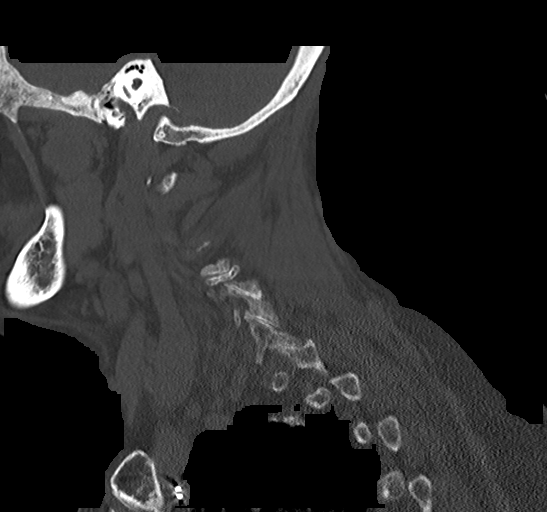

[Series 12: orthogonal axial bone · axial · 0.21mm/px · z∈[-328,-237]mm · 4 of 91 slices shown]
[im 19/91  bone]
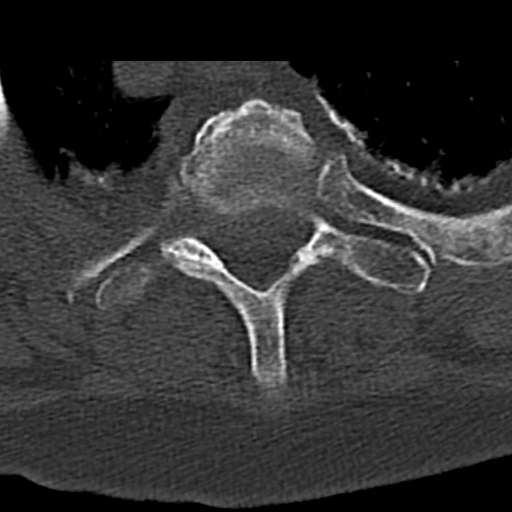
[im 37/91  bone]
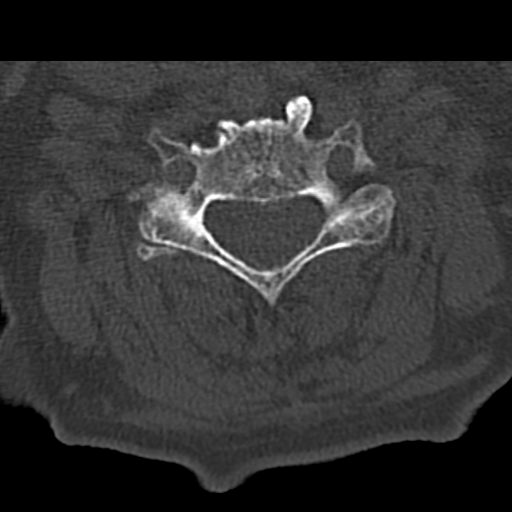
[im 55/91  bone]
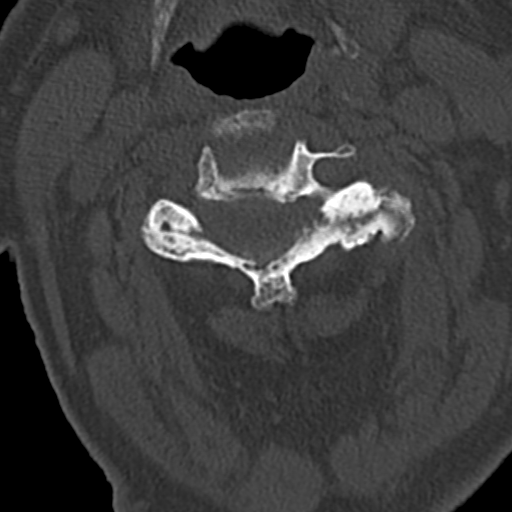
[im 73/91  bone]
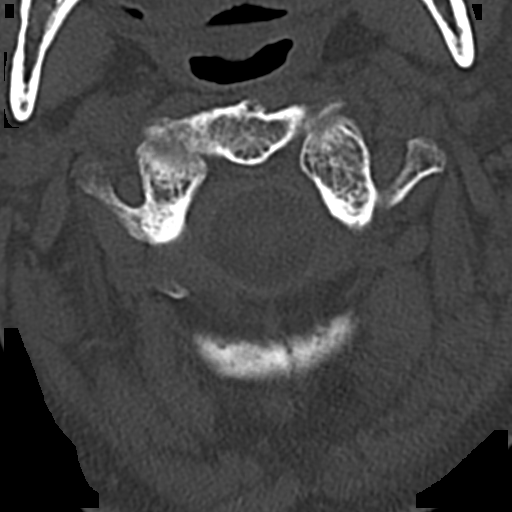

[15 of 33 positions shown; findings below may reference images not displayed]

FINDINGS: CT HEAD FINDINGS

Brain: Moderate atrophy. Chronic ischemic change in the white matter
is stable. Chronic ischemia in the right cerebellum is stable.
Negative for acute infarct. Negative for hemorrhage or mass

Vascular: Atherosclerotic calcification. Negative for hyperdense
vessel

Skull: Thickening of the right frontal bone is chronic and benign,
possible fibrous dysplasia.

Sinuses/Orbits: Mild mucosal edema paranasal sinuses. Bilateral
cataract removal.

Other: Left frontal scalp hematoma

CT CERVICAL SPINE FINDINGS

Alignment: 3 mm anterolisthesis L3-4 unchanged. 4 mm anterolisthesis
C7-T1 unchanged.

Skull base and vertebrae: Negative for acute fracture

Soft tissues and spinal canal: Negative

Disc levels: Multilevel disc and facet degeneration. Anterolisthesis
C3-4 and C7-T1 consistent with degenerative change.

Upper chest: Apical scarring.  No acute abnormality

Other: None
IMPRESSION: Left frontal scalp hematoma.  No acute intracranial abnormality

Cervical spine degenerative change as above.  No acute fracture

## 2019-10-18 DEATH — deceased
